# Patient Record
Sex: Female | Born: 1955 | Race: White | Hispanic: No | Smoking: Current every day smoker
Health system: Southern US, Community
[De-identification: ages and names within clinical notes are randomized; demographics above are authoritative.]

## PROBLEM LIST (undated history)

## (undated) DIAGNOSIS — I480 Paroxysmal atrial fibrillation: Secondary | ICD-10-CM

## (undated) DIAGNOSIS — C4490 Unspecified malignant neoplasm of skin, unspecified: Secondary | ICD-10-CM

## (undated) DIAGNOSIS — I341 Nonrheumatic mitral (valve) prolapse: Secondary | ICD-10-CM

## (undated) DIAGNOSIS — K219 Gastro-esophageal reflux disease without esophagitis: Secondary | ICD-10-CM

## (undated) DIAGNOSIS — I10 Essential (primary) hypertension: Secondary | ICD-10-CM

## (undated) DIAGNOSIS — M1990 Unspecified osteoarthritis, unspecified site: Secondary | ICD-10-CM

## (undated) DIAGNOSIS — D509 Iron deficiency anemia, unspecified: Secondary | ICD-10-CM

## (undated) DIAGNOSIS — E7800 Pure hypercholesterolemia, unspecified: Secondary | ICD-10-CM

## (undated) DIAGNOSIS — E0590 Thyrotoxicosis, unspecified without thyrotoxic crisis or storm: Secondary | ICD-10-CM

## (undated) DIAGNOSIS — G4730 Sleep apnea, unspecified: Secondary | ICD-10-CM

## (undated) DIAGNOSIS — Z8489 Family history of other specified conditions: Secondary | ICD-10-CM

## (undated) DIAGNOSIS — N6019 Diffuse cystic mastopathy of unspecified breast: Secondary | ICD-10-CM

## (undated) HISTORY — DX: Gastro-esophageal reflux disease without esophagitis: K21.9

## (undated) HISTORY — DX: Essential (primary) hypertension: I10

## (undated) HISTORY — DX: Paroxysmal atrial fibrillation: I48.0

## (undated) HISTORY — PX: EYE SURGERY: SHX253

## (undated) HISTORY — DX: Diffuse cystic mastopathy of unspecified breast: N60.19

## (undated) HISTORY — PX: TUBAL LIGATION: SHX77

## (undated) HISTORY — DX: Pure hypercholesterolemia, unspecified: E78.00

## (undated) HISTORY — PX: MENISECTOMY: SHX5181

## (undated) HISTORY — DX: Iron deficiency anemia, unspecified: D50.9

## (undated) HISTORY — DX: Thyrotoxicosis, unspecified without thyrotoxic crisis or storm: E05.90

## (undated) HISTORY — DX: Unspecified malignant neoplasm of skin, unspecified: C44.90

## (undated) HISTORY — DX: Nonrheumatic mitral (valve) prolapse: I34.1

---

## 1988-03-15 HISTORY — PX: NASAL SINUS SURGERY: SHX719

## 1988-03-15 HISTORY — PX: NASAL POLYP SURGERY: SHX186

## 1997-09-02 ENCOUNTER — Other Ambulatory Visit: Admission: RE | Admit: 1997-09-02 | Discharge: 1997-09-02 | Payer: Self-pay | Admitting: Family Medicine

## 2000-02-02 ENCOUNTER — Encounter: Payer: Self-pay | Admitting: Family Medicine

## 2000-02-02 ENCOUNTER — Encounter: Admission: RE | Admit: 2000-02-02 | Discharge: 2000-02-02 | Payer: Self-pay | Admitting: Family Medicine

## 2003-02-04 ENCOUNTER — Emergency Department (HOSPITAL_COMMUNITY): Admission: AD | Admit: 2003-02-04 | Discharge: 2003-02-04 | Payer: Self-pay | Admitting: Family Medicine

## 2003-07-18 ENCOUNTER — Encounter (INDEPENDENT_AMBULATORY_CARE_PROVIDER_SITE_OTHER): Payer: Self-pay | Admitting: *Deleted

## 2003-07-18 ENCOUNTER — Ambulatory Visit (HOSPITAL_COMMUNITY): Admission: RE | Admit: 2003-07-18 | Discharge: 2003-07-18 | Payer: Self-pay | Admitting: Gastroenterology

## 2003-07-21 ENCOUNTER — Emergency Department (HOSPITAL_COMMUNITY): Admission: EM | Admit: 2003-07-21 | Discharge: 2003-07-21 | Payer: Self-pay | Admitting: Family Medicine

## 2003-09-03 ENCOUNTER — Encounter: Admission: RE | Admit: 2003-09-03 | Discharge: 2003-09-03 | Payer: Self-pay | Admitting: Family Medicine

## 2003-09-03 ENCOUNTER — Other Ambulatory Visit: Admission: RE | Admit: 2003-09-03 | Discharge: 2003-09-03 | Payer: Self-pay | Admitting: Family Medicine

## 2004-03-11 ENCOUNTER — Emergency Department (HOSPITAL_COMMUNITY): Admission: EM | Admit: 2004-03-11 | Discharge: 2004-03-11 | Payer: Self-pay | Admitting: Emergency Medicine

## 2004-09-02 ENCOUNTER — Other Ambulatory Visit: Admission: RE | Admit: 2004-09-02 | Discharge: 2004-09-02 | Payer: Self-pay | Admitting: Family Medicine

## 2004-11-09 ENCOUNTER — Encounter: Payer: Self-pay | Admitting: Emergency Medicine

## 2004-11-10 ENCOUNTER — Observation Stay (HOSPITAL_COMMUNITY): Admission: EM | Admit: 2004-11-10 | Discharge: 2004-11-11 | Payer: Self-pay | Admitting: Internal Medicine

## 2005-04-19 ENCOUNTER — Encounter: Admission: RE | Admit: 2005-04-19 | Discharge: 2005-04-19 | Payer: Self-pay | Admitting: Family Medicine

## 2005-07-30 ENCOUNTER — Ambulatory Visit (HOSPITAL_COMMUNITY): Admission: RE | Admit: 2005-07-30 | Discharge: 2005-07-30 | Payer: Self-pay | Admitting: Chiropractic Medicine

## 2006-05-10 ENCOUNTER — Encounter: Admission: RE | Admit: 2006-05-10 | Discharge: 2006-05-10 | Payer: Self-pay | Admitting: Physician Assistant

## 2006-09-27 ENCOUNTER — Other Ambulatory Visit: Admission: RE | Admit: 2006-09-27 | Discharge: 2006-09-27 | Payer: Self-pay | Admitting: Family Medicine

## 2006-12-20 ENCOUNTER — Encounter (HOSPITAL_COMMUNITY): Admission: RE | Admit: 2006-12-20 | Discharge: 2007-02-16 | Payer: Self-pay | Admitting: Family Medicine

## 2007-06-04 ENCOUNTER — Emergency Department (HOSPITAL_COMMUNITY): Admission: EM | Admit: 2007-06-04 | Discharge: 2007-06-04 | Payer: Self-pay | Admitting: Emergency Medicine

## 2007-09-04 ENCOUNTER — Encounter: Admission: RE | Admit: 2007-09-04 | Discharge: 2007-09-04 | Payer: Self-pay | Admitting: Family Medicine

## 2008-10-23 ENCOUNTER — Emergency Department (HOSPITAL_COMMUNITY): Admission: EM | Admit: 2008-10-23 | Discharge: 2008-10-23 | Payer: Self-pay | Admitting: Emergency Medicine

## 2009-07-20 ENCOUNTER — Emergency Department (HOSPITAL_COMMUNITY): Admission: EM | Admit: 2009-07-20 | Discharge: 2009-07-20 | Payer: Self-pay | Admitting: Emergency Medicine

## 2009-08-25 IMAGING — CR DG CHEST 1V PORT
1 series · 1 of 1 positions shown · non-contrast
Comparison: Chest x-ray of 06/04/2007

CLINICAL DATA: Heart palpitations, history of atrial fibrillation

PORTABLE CHEST - 1 VIEW

[AP]
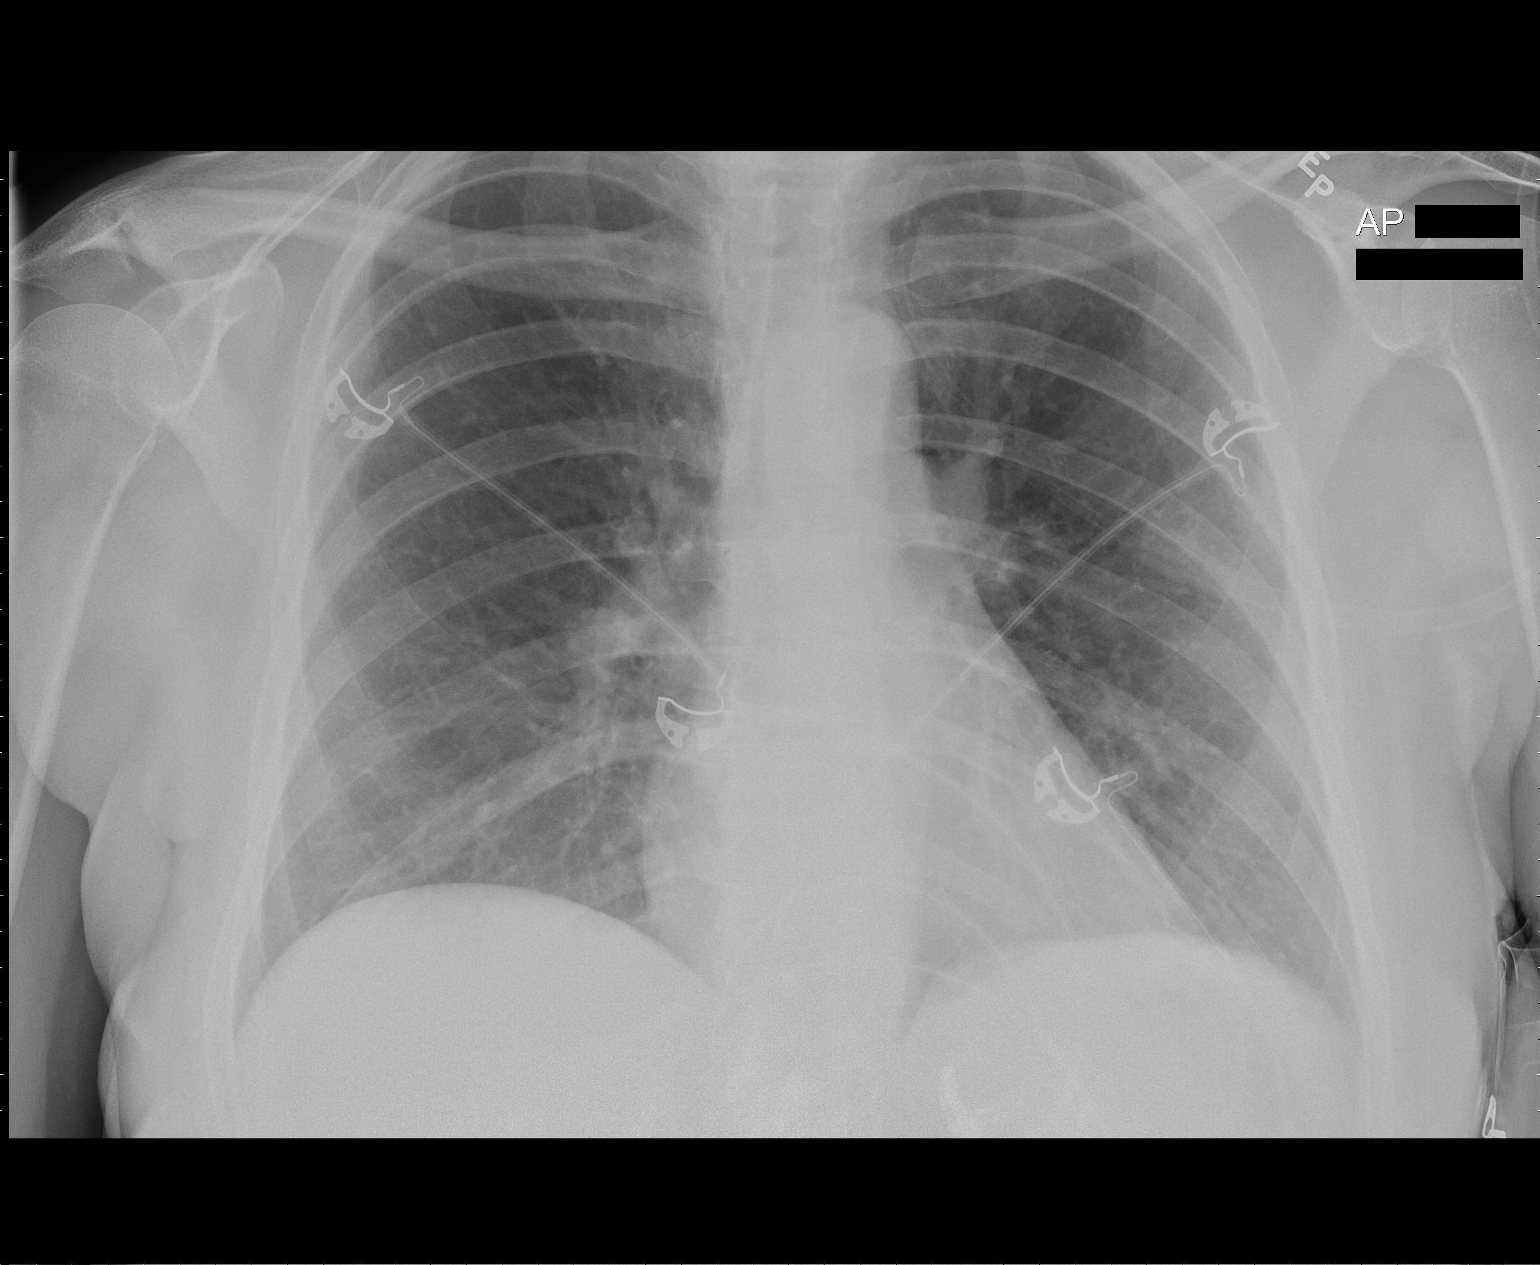

[1 of 1 positions shown; findings below may reference images not displayed]

FINDINGS: The lungs are clear.  The heart is within normal limits
in size.  No bony abnormality is seen.
IMPRESSION: No active lung disease.

## 2010-04-05 ENCOUNTER — Encounter: Payer: Self-pay | Admitting: Family Medicine

## 2010-06-02 LAB — BASIC METABOLIC PANEL
BUN: 8 mg/dL (ref 6–23)
Calcium: 9.4 mg/dL (ref 8.4–10.5)
Chloride: 104 mEq/L (ref 96–112)
Creatinine, Ser: 0.71 mg/dL (ref 0.4–1.2)
GFR calc Af Amer: >60 mL/min (ref >60)
GFR calc non Af Amer: >60 mL/min (ref >60)

## 2010-06-02 LAB — CBC
MCV: 92.6 fL (ref 78.0–100.0)
Platelets: 166 10*3/uL (ref 150–400)
RBC: 4.86 MIL/uL (ref 3.87–5.11)
WBC: 9.1 10*3/uL (ref 4.0–10.5)

## 2010-06-20 LAB — DIFFERENTIAL
Basophils Absolute: 0 10*3/uL (ref 0.0–0.1)
Basophils Relative: 1 % (ref 0–1)
Eosinophils Relative: 3 % (ref 0–5)
Lymphs Abs: 2.6 10*3/uL (ref 0.7–4.0)
Monocytes Relative: 7 % (ref 3–12)
Neutro Abs: 3.3 10*3/uL (ref 1.7–7.7)

## 2010-06-20 LAB — URINALYSIS, ROUTINE W REFLEX MICROSCOPIC
Hgb urine dipstick: NEGATIVE
Ketones, ur: NEGATIVE mg/dL
Protein, ur: NEGATIVE mg/dL
Urobilinogen, UA: 0.2 mg/dL (ref 0.0–1.0)
pH: 6 (ref 5.0–8.0)

## 2010-06-20 LAB — CBC
MCV: 93.1 fL (ref 78.0–100.0)
Platelets: 187 10*3/uL (ref 150–400)
WBC: 6.7 10*3/uL (ref 4.0–10.5)

## 2010-06-20 LAB — BASIC METABOLIC PANEL
BUN: 5 mg/dL — ABNORMAL LOW (ref 6–23)
CO2: 22 mEq/L (ref 19–32)
Chloride: 106 mEq/L (ref 96–112)
GFR calc Af Amer: >60 mL/min (ref >60)
GFR calc non Af Amer: >60 mL/min (ref >60)
Glucose, Bld: 162 mg/dL — ABNORMAL HIGH (ref 70–99)
Potassium: 3.3 mEq/L — ABNORMAL LOW (ref 3.5–5.1)
Sodium: 139 mEq/L (ref 135–145)

## 2010-06-20 LAB — POCT CARDIAC MARKERS
CKMB, poc: 1.7 ng/mL (ref 1.0–8.0)
Myoglobin, poc: 62.1 ng/mL (ref 12–200)

## 2010-06-20 LAB — GLUCOSE, CAPILLARY: Glucose-Capillary: 133 mg/dL — ABNORMAL HIGH (ref 70–99)

## 2010-07-31 NOTE — H&P (Signed)
NAMESHEELA, Kellie Reynolds NO.:  [id number]   MEDICAL RECORD NO.:  [id number]          PATIENT TYPE:  EMS   LOCATION:  ED                           FACILITY:  [facility]   PHYSICIAN:  Deirdre Peer. Polite, M.D. DATE OF BIRTH:  10-Aug-1955   DATE OF ADMISSION:  11/09/2004  DATE OF DISCHARGE:                                HISTORY & PHYSICAL   CHIEF COMPLAINT:  Nausea, vomiting, fever, chills.   HISTORY OF PRESENT ILLNESS:  Kellie Reynolds is a 55 year old female with known  history of COPD, hypertension, high cholesterol, and mitral valve prolapse  who presents to the ED for evaluation of the above chief complaint. The  patient states that she has been having trouble with above chief complaint  off and on since early July. The patient stated that at the time she saw her  primary M.D., Dr. Clovis Riley, who treated her with a Z-Pak and a decongestant  for presumed upper respiratory infection symptoms. The patient had  complained of chills, nausea, and headache. At the end of July the patient  was treated again with Augmentin, according to the patient four pills a day  for 10 days, again for the same complaints. The patient stated that she did  feel somewhat better. However, symptoms appear to have resumed. The patient  states that she chronically has a cough which is productive of yellow-green  sputum, admits to having some fever as high as 102 today. Also, nausea and  vomiting. Denies any abdominal pain. The patient presented to the ED for  evaluation as these symptoms have been persistent on and off since early  July. In the ED the patient was evaluated, had chest x-ray, no apparent  disease. CBC without leukocytosis. However, UA significantly abnormal  showing LE and nitrite positive, white blood cells too numerous to count.  Urine red blood cells 3-6 and many bacteria. [facility] were called  for further evaluation and admission. At the time of my evaluation, the  patient is  alert and oriented x3, still with a low-grade temperature of  99.0, with the symptoms as stated above. Admission is deemed necessary for  further evaluation and treatment for possible urosepsis, and as the patient  has a history of mitral valve prolapse rule out possible endocarditis.   PAST MEDICAL HISTORY:  As stated above, significant for:  1.  Hypertension.  2.  High cholesterol.  3.  Mitral valve prolapse.   MEDICATIONS ON ADMISSION:  1.  Lopressor 50 mg daily.  2.  Lipitor unknown dose.  3.  Effexor 75 mg.   SOCIAL HISTORY:  Positive for tobacco, a pack per day. Positive for alcohol,  approximately four beers a day. Negative for drugs.   PAST SURGICAL HISTORY:  Significant for tubal ligation in the past.   FAMILY HISTORY:  Mother with diabetes, coronary artery disease. Father with  coronary artery disease. Brothers and sisters healthy.   REVIEW OF SYSTEMS:  As stated in the HPI. Occasional headache. Occasional  nausea and vomiting over the last couple of days. No chest pain. Positive  occasional shortness of breath with  productive cough of yellow-green sputum.  No abdominal pain. The patient states that urine has changed in coloration,  being darker, but she denies any dysuria or frequency. Denies any vaginal  discharge; however, does admit to having a recent yeast infection after  prolonged antibiotic of Augmentin.   PHYSICAL EXAMINATION:  GENERAL:  The patient is alert and oriented x3.  VITAL SIGNS:  Temperature 99.0, blood pressure 92/50, pulse 64, respiratory  rate of 16, saturating 97%.  HEENT:  Pupils equal, round, and reactive to light. Anicteric sclerae. No  oral lesions. No nodes, no JVD.  CHEST:  Moderate air movement without rales or rhonchi.  CARDIOVASCULAR:  Regular S1, S2.  ABDOMEN:  Soft, nontender. No hepatosplenomegaly.  EXTREMITIES:  No clubbing, cyanosis, or edema.  SKIN:  No rash.   DATA:  Chest x-ray:  No apparent disease. BMET significant for  potassium of  3.2, sodium 135, BUN of 4, creatinine 0.8, calcium 8.0. CBC:  White count  6.3, hemoglobin 13.1, hematocrit 38.2, MCV 89.8, platelets 161, neutrophil  count 70. UA:  Specific gravity 1.018, urine glucose 100, urine nitrite  positive, leukocyte esterase large, urine wbc's too numerous to count, urine  rbc's 3-6 per high-powered field, bacteria many.   ASSESSMENT:  1.  Urinary tract infection, rule out urosepsis.  2.  Fever. Differential will include secondary to #1 plus or minus subacute      endocarditis. However, please note the patient has not had any recent      invasive procedures or dental procedures in relation to her mitral valve      prolapse.  3.  Chronic obstructive pulmonary disease with moderate chronic sputum      production which has seen no change in frequency or volume per the      patient's report.  4.  Hypertension.  5.  Postmenopausal.  6.  High cholesterol.  7.  History of mitral valve prolapse.   Recommend the patient be admitted to a medicine floor bed. The patient will  be given judicious IV fluids, started empirically on antibiotics, and  pancultured. Right now, the patient definitely has chronic bronchitis which  appears to have been treated appropriately on an outpatient basis, but still  may be a causative factor of her fever. However, her urine seems to be the  obvious source at this time and will  focus antibiotics at that source. As  stated, the patient will have blood cultures as well to rule out subacute  process as stated above. Will make further recommendations after reviewing  the above studies.      Deirdre Peer. Polite, M.D.  Electronically Signed     RDP/MEDQ  D:  11/09/2004  T:  11/09/2004  Job:  [id number]

## 2010-07-31 NOTE — Discharge Summary (Signed)
Kellie Reynolds, Kellie Reynolds NO.:  [id number]   MEDICAL RECORD NO.:  [id number]          PATIENT TYPE:  EMS   LOCATION:  ED                           FACILITY:  [facility]   PHYSICIAN:  Melissa L. Ladona Ridgel, MD  DATE OF BIRTH:  28-Aug-1955   DATE OF ADMISSION:  11/09/2004  DATE OF DISCHARGE:  11/11/2004                                 DISCHARGE SUMMARY   DISCHARGE DIAGNOSES:  1.  Nausea and vomiting secondary to E. coli urinary tract infection. The      patient has been able to tolerate oral medications as well as oral diet.      She has had no nausea and vomiting as of the day of discharge.  2.  Urinary tract infection.  The patient has an E. coli UTI.  The      sensitivities are yet to be available despite calling the microbiology      lab multiple times. The patient states that she feels well. She has a      little bit of burning on urination but has had no further symptoms of      weakness with nausea or vomiting. I will therefore will discharge the      patient home on a quinolone, namely Levaquin, and call the patient in      the morning after receiving the sensitivities. I am concerned that the      E. coli may be resistant to quinolones as most E. coli coming through      the hospital have been resistant.  However, since the patient has been      afebrile and is not nauseated or vomiting and feels well, I feel that      she can discharge to home on the quinolone with follow up from me      regarding the appropriate antibiotic selection. If the E. coli returns      showing resistant to quinolone, I will call her in appropriate      antibiotic therapy at bedtime.  3.  Rapid heart rate. The patient reports a history of chronic palpitations      related to her mitral valve prolapse for which she takes Lopressor 50 mg      daily.  We have not made any changes at this.  4.  Hypercholesterolemia. She is to resume her Lipitor. She did not know      what her home dosing was.   Therefore, she should resume her Lipitor at      the home dose.  5.  Depression. The patient will remain on Effexor 75 mg q.d.  6.  Tobacco abuse. The patient has been offered actually ordered for tobacco      cessation although I do not see that a consult was able to be completed      in the time that she was in the hospital.  I have encouraged her to      attempt to quit smoking. She can pursue further counseling with her      primary care physician.  7.  Mild bronchitis. The  patient will be crossed covered for her UTI with      the Levaquin.  At this time, I do not feel there is any further      intervention for her sputum production other than tobacco cessation.   DISCHARGE MEDICATIONS:  1.  Lopressor 50 mg q.d.  2.  Lipitor.  She is to resume her home dose.  That dose is unknown at this      time.  3.  Effexor 75 mg q.d.  4.  Levaquin 750 mg q.d. x 10 days.   HISTORY OF PRESENT ILLNESS:  The patient is a 55 year old white female with  known history of COPD, hypertension, hypercholesteremia and mitral valve  prolapse. The patient presented to the emergency room after having several  episodes of nausea and vomiting. The patient states that she had been seen  earlier in the month by her primary care physician, actually earlier in July  by her primary care physician for symptoms of cough, congestion.  The  feeling was that she had an upper respiratory infection and therefore she  was started on a Z-Pak.  She also relates that she was given Augmentin when  the symptoms did not improve.  She completed nearly all of the Augmentin  when she continued to have nausea, vomiting and on and off fever. She came  to the emergency room for further evaluation.  In the emergency room, she  was found to have significant pyuria and was placed in the hospital for  further evaluation and IV antibiotics. The patient responded favorably to IV  hydration, medications for nausea and IV Cipro. On the day  of discharge, the  patient appears stable, feeling much better, able to eat and keep her food  down. Unfortunately, as stated, we are unable to get the sensitivities for  her E. coli UTI at this time. I will therefore follow up with the patient  and assure that she is on appropriate antibiotics.   PHYSICAL EXAMINATION:  VITAL SIGNS:  On the day of discharge, the patient's  temperature is 98.6. She did have a T-max of 100.3 at 12 noon on August29th  but no fever since.  Her blood pressure is 109/64, pulse is 61, respirations  18, saturations 97%.  GENERAL:  This is a well-developed, well-nourished white female in no acute  distress.  HEENT:  Pupils equally round and reactive to light. Extraocular muscles  intact. Mucous membranes are moist.  NECK:  Supple with no JVD, no lymph nodes, no carotid bruits.  LUNGS:  She does have mild crackles at the bases which clear with cough. She  had no rhonchi, rales or wheezes.  CARDIOVASCULAR:  Regular rate and rhythm, positive S1, S2.  No S3, S4.  ABDOMEN:  Soft, nontender, nondistended.  EXTREMITIES:  No clubbing, cyanosis or edema.  NEUROLOGIC:  She is awake, alert, oriented. Cranial nerves II-XII are  intact.  Strength is 5/5.   LABORATORY DATA:  Pertinent laboratory values during the course of hospital  stay reveal on the day of discharge a white count of 5.0, hemoglobin of 11.9  hematocrit 34.5 and platelets of 150. Her respiratory growth culture grew  normal flora.  Her BMET shows a sodium of 142, potassium 3.7, chloride of  107, CO2 of 28, BUN 2, creatinine of 0.7, glucose of 118 and calcium of 8.9.  The urine culture as stated is greater than 100,000 colonies of E. coli.  Sensitivities are pending.  Blood cultures have been negative.  At this time the patient is deemed stable for discharge to home on oral  antibiotics with follow-up with sensitivities by Dr. Ladona Ridgel, who will call her if there is any change to the antibiotic.  I will also  call her  regardless of the appropriate antibiotic therapy just to assure her and to  continue her treatment.   Addendum 11/23/04 MLT:  The patient was called the following day with the  report that her E. coli was pansensitive.  She was instructed to continue  her antibiotics as ordered.      Melissa L. Ladona Ridgel, MD  Electronically Signed     MLT/MEDQ  D:  11/11/2004  T:  11/11/2004  Job:  [id number]

## 2010-07-31 NOTE — Op Note (Signed)
NAME:  Kellie Reynolds, Kellie Reynolds                        ACCOUNT NO.:  [id number]   MEDICAL RECORD NO.:  [id number]                   PATIENT TYPE:  AMB   LOCATION:  ENDO                                 FACILITY:  [facility]   PHYSICIAN:  Danise Edge, M.D.                DATE OF BIRTH:  May 22, 1955   DATE OF PROCEDURE:  07/18/2003  DATE OF DISCHARGE:                                 OPERATIVE REPORT   PROCEDURE:  Esophagogastroduodenoscopy, colonoscopy, polypectomy.   REFERRING PHYSICIAN:  L. Lupe Carney, M.D.   INDICATIONS:  [name] is a 55 year old female, born Jul 17, 1955.  Mrs. Sellen is undergoing upper and lower GI endoscopy to evaluate blood  in her stool.   ENDOSCOPIST:  Danise Edge, M.D.   PREMEDICATION:  Versed 10 mg, Demerol 70 mg.   DESCRIPTION OF PROCEDURE:  Diagnostic esophagogastroduodenoscopy:  After  obtaining informed consent, Mrs. Gaetz was placed on the left lateral  decubitus position.  I administered intravenous Demerol and intravenous  Versed to achieve conscious sedation for the procedure.  The patient's blood  pressure, oxygen saturation and cardiac rhythm were monitored throughout the  procedure and documented in the medical record.   The Olympus gastroscope was passed through the posterior hypopharynx into  the proximal esophagus without difficulty.  The hypopharynx, larynx and  vocal cords appeared normal.   Esophagoscopy:  The proximal, mid and lower segments of the esophageal  mucosa appear normal.   Gastroscopy:  Mrs. Fedor has a small hiatal hernia.  Retroflexed view of  the gastric cardia and fundus was normal.  The gastric body, antrum and  pylorus appeared normal.   Duodenoscopy:  The duodenal bulb, mid duodenum and distal duodenum appear  normal.   ASSESSMENT:  1. Normal esophagogastroduodenoscopy.  2. Small hiatal hernia.   Proctocolonoscopy with polypectomy:  Anal inspection and digital rectal exam  were normal.  The  Olympus adjustable pediatric colonoscope was introduced  into the rectum and advanced to the cecum.  Colonic preparation for the exam  today was excellent.   Rectum:  From the proximal rectum, a 3 mm sessile polyp was removed with the  electrocautery snare.  Sigmoid colon and descending colon:  At 60 cm from the anal verge, a 2 mm  sessile polyp was removed with the electrocautery snare.  Splenic flexure:  Normal.  Transverse colon:  Normal.  Hepatic flexure:  Normal.  Ascending colon:  Normal.  Cecum and ileocecal valve:  From the distal cecum, a 3 mm sessile polyp was  removed with the electrocautery snare.   ASSESSMENT:  A small polyp was removed from the distal cecum, a small polyp  was removed from the left colon at 60 cm from the anal verge and a small  polyp was removed from the proximal rectum.  Danise Edge, M.D.    MJ/MEDQ  D:  07/18/2003  T:  07/18/2003  Job:  [id number]   cc:   L. Lupe Carney, M.D.  [address] [place]  [place] [address]  Fax: [phone]

## 2010-12-07 LAB — I-STAT 8, (EC8 V) (CONVERTED LAB)
Acid-base deficit: 2
BUN: 6
Bicarbonate: 20.2
Glucose, Bld: 109 — ABNORMAL HIGH
Operator id: 284141
Sodium: 138
TCO2: 21
pH, Ven: 7.473 — ABNORMAL HIGH

## 2010-12-07 LAB — POCT I-STAT CREATININE
Creatinine, Ser: 0.9
Operator id: 284141

## 2010-12-07 LAB — POCT CARDIAC MARKERS: Myoglobin, poc: 53.3

## 2011-05-10 ENCOUNTER — Ambulatory Visit (INDEPENDENT_AMBULATORY_CARE_PROVIDER_SITE_OTHER): Payer: BC Managed Care – PPO | Admitting: Family Medicine

## 2011-05-10 VITALS — BP 134/74 | HR 73 | Temp 98.1°F | Resp 16 | Ht 69.0 in | Wt 178.0 lb

## 2011-05-10 DIAGNOSIS — E7800 Pure hypercholesterolemia, unspecified: Secondary | ICD-10-CM

## 2011-05-10 DIAGNOSIS — J189 Pneumonia, unspecified organism: Secondary | ICD-10-CM

## 2011-05-10 DIAGNOSIS — I10 Essential (primary) hypertension: Secondary | ICD-10-CM

## 2011-05-10 DIAGNOSIS — I4891 Unspecified atrial fibrillation: Secondary | ICD-10-CM | POA: Insufficient documentation

## 2011-05-10 MED ORDER — CEFDINIR 300 MG PO CAPS: 300.0000 mg | ORAL_CAPSULE | Freq: Two times a day (BID) | ORAL | Status: AC

## 2011-05-10 NOTE — Progress Notes (Signed)
  Patient Name: CECILLIA Reynolds Date of Birth: [birth year] Medical Record Number: [id number] Gender: female Date of Encounter: 05/10/2011  History of Present Illness:  Kellie Reynolds is a 56 y.o. very pleasant female patient who presents with the following:  Felt fine all weekend- then yesterday am "my lungs were on fire."  She does smoke but had not smoked much the 24 hours before because she was in airports traveling home.  Then developed a productive cough with discolered mucus, burning sinuses today.  No fever, does have chills.  Had been in [place] last week; 2 hour flight home.  No sore throat or earache, did have diarrhea yesterday but could have been due to diet change.   History of intermittent atrial fibrillation- pt of [facility].  She can tell when she is not in rhythm but currently she IS in sinus rhythmv Patient Active Problem List  Diagnoses  . Atrial fibrillation  . High cholesterol  . Hypertension   Past Medical History  Diagnosis Date  . GERD (gastroesophageal reflux disease)    Past Surgical History  Procedure Date  . Tubal ligation    History  Substance Use Topics  . Smoking status: Current Everyday Smoker  . Smokeless tobacco: Never Used  . Alcohol Use: Yes     12 beers weekly   Family History  Problem Relation Age of Onset  . Diabetes Mother   . Hyperlipidemia Father   . Hypertension Father    No Known Allergies  Medication list has been reviewed and updated.  Review of Systems: As per HPI- otherwise negative.  Physical Examination: Filed Vitals:   05/10/11 1257  BP: 134/74  Pulse: 73  Temp: 98.1 F (36.7 C)  TempSrc: Oral  Resp: 16  Height: 5\' 9"  (1.753 m)  Weight: 178 lb (80.74 kg)    Body mass index is 26.29 kg/(m^2).  GEN: WDWN, NAD, Non-toxic, A & O x 3, overweight, sunburned HEENT: Atraumatic, Normocephalic. Neck supple. No masses, No LAD. TM wnl bilaterally, oropharynx wnl Ears and Nose: No external deformity. Nasal cavity  congested CV: RRR, mildly bradycardic.  Seems to be in sinus bradycardia.  PULM: good air movement bilaterally, there are crackles at the right base suggesting pneumonia. No retractions. No resp. distress. No accessory muscle use. EXTR: No c/c/e NEURO Normal gait.  PSYCH: Normally interactive. Conversant. Not depressed or anxious appearing.  Calm demeanor.   Assessment and Plan: 1. Pneumonia  cefdinir (OMNICEF) 300 MG capsule  2. Atrial fibrillation    3. High cholesterol    4. Hypertension     Treat for pneumonia as above.  If not better within the next couple of days let us know- sooner if worse!

## 2012-02-14 ENCOUNTER — Encounter: Payer: Self-pay | Admitting: Internal Medicine

## 2012-03-01 ENCOUNTER — Ambulatory Visit: Payer: Self-pay | Admitting: Cardiovascular Disease

## 2012-03-03 ENCOUNTER — Encounter: Payer: Self-pay | Admitting: *Deleted

## 2012-03-10 ENCOUNTER — Encounter: Payer: Self-pay | Admitting: *Deleted

## 2012-03-10 ENCOUNTER — Ambulatory Visit (INDEPENDENT_AMBULATORY_CARE_PROVIDER_SITE_OTHER): Payer: BC Managed Care – PPO | Admitting: Internal Medicine

## 2012-03-10 VITALS — BP 148/76 | HR 54 | Ht 68.0 in | Wt 189.4 lb

## 2012-03-10 DIAGNOSIS — I4892 Unspecified atrial flutter: Secondary | ICD-10-CM

## 2012-03-10 DIAGNOSIS — I10 Essential (primary) hypertension: Secondary | ICD-10-CM

## 2012-03-10 DIAGNOSIS — I4891 Unspecified atrial fibrillation: Secondary | ICD-10-CM

## 2012-03-10 MED ORDER — RIVAROXABAN 20 MG PO TABS: 20.0000 mg | ORAL_TABLET | Freq: Every day | ORAL | Status: DC

## 2012-03-10 NOTE — Patient Instructions (Addendum)
Your physician has recommended that you have an ablation. Catheter ablation is a medical procedure used to treat some cardiac arrhythmias (irregular heartbeats). During catheter ablation, a long, thin, flexible tube is put into a blood vessel in your groin (upper thigh), or neck. This tube is called an ablation catheter. It is then guided to your heart through the blood vessel. Radio frequency waves destroy small areas of heart tissue where abnormal heartbeats may cause an arrhythmia to start. Please see the instruction sheet given to you today.    Your physician has recommended you make the following change in your medication:  1) Stop Aspirin 2) Start Xarelto 20mg  daily   Ablation will ne on 04/18/12 with TEE ON 04/17/12--- [facility] Dr Carmelia Roller office will call you to schedule TEE

## 2012-03-17 ENCOUNTER — Other Ambulatory Visit: Payer: Self-pay | Admitting: *Deleted

## 2012-03-17 DIAGNOSIS — I4891 Unspecified atrial fibrillation: Secondary | ICD-10-CM

## 2012-03-19 ENCOUNTER — Encounter: Payer: Self-pay | Admitting: Internal Medicine

## 2012-03-19 DIAGNOSIS — I4892 Unspecified atrial flutter: Secondary | ICD-10-CM | POA: Insufficient documentation

## 2012-03-19 NOTE — Assessment & Plan Note (Signed)
Stable No change required today  

## 2012-03-19 NOTE — Assessment & Plan Note (Signed)
The patient has paroxysmal atrial fibrillation.  Her recent event monitor has also documented atrial flutter. Therapeutic strategies for afib and atrial flutter including medicine and ablation were discussed in detail with the patient today. Risk, benefits, and alternatives to EP study and radiofrequency ablation for afib were also discussed in detail today. These risks include but are not limited to stroke, bleeding, vascular damage, tamponade, perforation, damage to the esophagus, lungs, and other structures, pulmonary vein stenosis, worsening renal function, and death. The patient understands these risk and wishes to proceed.   We will stop ASA and start Xarelto 20mg  daily today.   We will therefore proceed with catheter ablation once the patient has been adequately anticoagulated.

## 2012-03-19 NOTE — Progress Notes (Signed)
 Primary Care Physician: Dr Dean Mitchell  Referring Physician:  Dr Varanasi   Kellie Reynolds is a 57 y.o. female with a h/o paroxysmal atrial fibrillation who presents today for EP consultation.  The patient reports that she has had afib for at least 5 or 6 years.  Episodes have recently increased in frequency and duration.  She reports tachypalpitations with associated SOB, fatigue, and decreased exercise tolerance.  She has been evaluated by Dr Varanasi and treated with diltiazem and flecainide.  She reports ongoing afib 3-4 times per week despite medical therapy with flecainide.  She reports that she does not well tolerate medicines and is afraid to try other AAD.  She has been wearing an event monitor recently which has documented both atrial fibrillation as well as atrial flutter. Today, she denies symptoms of chest pain, shortness of breath, orthopnea, PND, lower extremity edema, dizziness, presyncope, syncope, or neurologic sequela. The patient is tolerating medications without difficulties and is otherwise without complaint today.   Past Medical History  Diagnosis Date  . GERD (gastroesophageal reflux disease)   . HTN (hypertension)   . Hypercholesteremia   . MVP (mitral valve prolapse)   . Paroxysmal atrial fibrillation   . Skin cancer   . Fibrocystic breast disease    Past Surgical History  Procedure Date  . Tubal ligation   . Menisectomy     Right knee  . Nasal sinus surgery 1990  . Nasal polyp surgery 1990    Current Outpatient Prescriptions  Medication Sig Dispense Refill  . aspirin 325 MG EC tablet Take 325 mg by mouth daily.      . atorvastatin (LIPITOR) 10 MG tablet Take 10 mg by mouth daily.      . cyanocobalamin 1000 MCG tablet Take 100 mcg by mouth daily.      . fish oil-omega-3 fatty acids 1000 MG capsule Take 1,200 mg by mouth 2 (two) times daily.      . flecainide (TAMBOCOR) 100 MG tablet Take 100 mg by mouth 2 (two) times daily.      . lisinopril  (PRINIVIL,ZESTRIL) 10 MG tablet Take 10 mg by mouth daily.      . metoprolol succinate (TOPROL-XL) 50 MG 24 hr tablet Take 50 mg by mouth 2 (two) times daily. Take with or immediately following a meal.      . omeprazole (PRILOSEC) 20 MG capsule Take 20 mg by mouth daily.      . Rivaroxaban (XARELTO) 20 MG TABS Take 1 tablet (20 mg total) by mouth daily.  30 tablet  6    No Known Allergies  History   Social History  . Marital Status: Divorced    Spouse Name: N/A    Number of Children: N/A  . Years of Education: N/A   Occupational History  . Not on file.   Social History Main Topics  . Smoking status: Current Every Day Smoker -- 1.0 packs/day  . Smokeless tobacco: Never Used  . Alcohol Use: Yes     Comment: 12 beers weekly  . Drug Use: No  . Sexually Active: Not on file   Other Topics Concern  . Not on file   Social History Narrative  . No narrative on file    Family History  Problem Relation Age of Onset  . Diabetes Mother   . Hyperlipidemia Father   . Hypertension Father   . CAD Father   . Liver cancer Maternal Grandmother     ROS- All systems   are reviewed and negative except as per the HPI above  Physical Exam: Filed Vitals:   03/10/12 1606  BP: 148/76  Pulse: 54  Height: 5' 8" (1.727 m)  Weight: 189 lb 6.4 oz (85.911 kg)    GEN- The patient is well appearing, alert and oriented x 3 today.   Head- normocephalic, atraumatic Eyes-  Sclera clear, conjunctiva pink Ears- hearing intact Oropharynx- clear Neck- supple, no JVP Lymph- no cervical lymphadenopathy Lungs- Clear to ausculation bilaterally, normal work of breathing Heart- Regular rate and rhythm, no murmurs, rubs or gallops, PMI not laterally displaced GI- soft, NT, ND, + BS Extremities- no clubbing, cyanosis, or edema MS- no significant deformity or atrophy Skin- no rash or lesion Psych- euthymic mood, full affect Neuro- strength and sensation are intact  EKG today reveals sinus rhythm 56  bpm, PR 208msec, otherwise normal ekg Echo 04/06/04- ef 70%, mild MR, LA 37mm  Assessment and Plan:  

## 2012-03-19 NOTE — Assessment & Plan Note (Signed)
As above.

## 2012-03-20 ENCOUNTER — Telehealth: Payer: Self-pay | Admitting: Internal Medicine

## 2012-03-20 NOTE — Telephone Encounter (Signed)
New problem:    Returning call from Friday discuss upcoming procedure.

## 2012-03-20 NOTE — Telephone Encounter (Signed)
Afib ablation scheduled for 04/18/12  Dr Sidney Ace office to do the TEE on 2/3  I have left linda a message in regards to this and have asked the patient to call also

## 2012-03-28 ENCOUNTER — Encounter (HOSPITAL_COMMUNITY): Payer: Self-pay | Admitting: Emergency Medicine

## 2012-03-28 ENCOUNTER — Emergency Department (HOSPITAL_COMMUNITY)
Admission: EM | Admit: 2012-03-28 | Discharge: 2012-03-28 | Disposition: A | Discharge status: Home / Self Care | Payer: BC Managed Care – PPO | Attending: Emergency Medicine | Admitting: Emergency Medicine

## 2012-03-28 ENCOUNTER — Emergency Department (HOSPITAL_COMMUNITY): Payer: BC Managed Care – PPO

## 2012-03-28 DIAGNOSIS — R059 Cough, unspecified: Secondary | ICD-10-CM | POA: Insufficient documentation

## 2012-03-28 DIAGNOSIS — R0602 Shortness of breath: Secondary | ICD-10-CM | POA: Insufficient documentation

## 2012-03-28 DIAGNOSIS — K219 Gastro-esophageal reflux disease without esophagitis: Secondary | ICD-10-CM | POA: Insufficient documentation

## 2012-03-28 DIAGNOSIS — R0789 Other chest pain: Secondary | ICD-10-CM | POA: Insufficient documentation

## 2012-03-28 DIAGNOSIS — R5381 Other malaise: Secondary | ICD-10-CM | POA: Insufficient documentation

## 2012-03-28 DIAGNOSIS — Z85828 Personal history of other malignant neoplasm of skin: Secondary | ICD-10-CM | POA: Insufficient documentation

## 2012-03-28 DIAGNOSIS — Z8742 Personal history of other diseases of the female genital tract: Secondary | ICD-10-CM | POA: Insufficient documentation

## 2012-03-28 DIAGNOSIS — Z7901 Long term (current) use of anticoagulants: Secondary | ICD-10-CM | POA: Insufficient documentation

## 2012-03-28 DIAGNOSIS — Z79899 Other long term (current) drug therapy: Secondary | ICD-10-CM | POA: Insufficient documentation

## 2012-03-28 DIAGNOSIS — I10 Essential (primary) hypertension: Secondary | ICD-10-CM | POA: Insufficient documentation

## 2012-03-28 DIAGNOSIS — R002 Palpitations: Secondary | ICD-10-CM | POA: Insufficient documentation

## 2012-03-28 DIAGNOSIS — R05 Cough: Secondary | ICD-10-CM | POA: Insufficient documentation

## 2012-03-28 DIAGNOSIS — E7800 Pure hypercholesterolemia, unspecified: Secondary | ICD-10-CM | POA: Insufficient documentation

## 2012-03-28 DIAGNOSIS — Z8679 Personal history of other diseases of the circulatory system: Secondary | ICD-10-CM | POA: Insufficient documentation

## 2012-03-28 DIAGNOSIS — F17200 Nicotine dependence, unspecified, uncomplicated: Secondary | ICD-10-CM | POA: Insufficient documentation

## 2012-03-28 DIAGNOSIS — I4892 Unspecified atrial flutter: Secondary | ICD-10-CM

## 2012-03-28 LAB — CBC
MCH: 31.5 pg (ref 26.0–34.0)
MCHC: 34.9 g/dL (ref 30.0–36.0)
Platelets: 175 10*3/uL (ref 150–400)
RDW: 12.7 % (ref 11.5–15.5)

## 2012-03-28 LAB — BASIC METABOLIC PANEL
BUN: 9 mg/dL (ref 6–23)
Calcium: 9.5 mg/dL (ref 8.4–10.5)
Chloride: 99 mEq/L (ref 96–112)
Creatinine, Ser: 0.79 mg/dL (ref 0.50–1.10)
GFR calc Af Amer: >90 mL/min (ref >90)
GFR calc non Af Amer: >90 mL/min (ref >90)

## 2012-03-28 LAB — TROPONIN I: Troponin I: <0.3 ng/mL (ref <0.30)

## 2012-03-28 LAB — MAGNESIUM: Magnesium: 1.8 mg/dL (ref 1.5–2.5)

## 2012-03-28 MED ORDER — SODIUM CHLORIDE 0.9 % IV BOLUS (SEPSIS)
1000.0000 mL | Freq: Once | INTRAVENOUS | Status: AC
  Administered 2012-03-28: 1000 mL via INTRAVENOUS

## 2012-03-28 MED ORDER — MORPHINE SULFATE 4 MG/ML IJ SOLN: 4.0000 mg | Freq: Once | INTRAMUSCULAR | Status: DC

## 2012-03-28 MED ORDER — ASPIRIN 81 MG PO CHEW: 324.0000 mg | CHEWABLE_TABLET | Freq: Once | ORAL | Status: DC

## 2012-03-28 MED ORDER — METOPROLOL TARTRATE 1 MG/ML IV SOLN
5.0000 mg | Freq: Once | INTRAVENOUS | Status: AC
  Administered 2012-03-28: 5 mg via INTRAVENOUS
  Filled 2012-03-28: qty 5

## 2012-03-28 NOTE — ED Notes (Signed)
Patient c/o increased shortness of breath and Afib.  Patient is scheduled for an ablation on 04/18/12 with Dr. Johney Frame.  Patient states she was walking into work this morning and felt like she was going to pass out.

## 2012-03-28 NOTE — ED Notes (Signed)
Bed:WA22<BR> Expected date:<BR> Expected time:<BR> Means of arrival:<BR> Comments:<BR>

## 2012-03-28 NOTE — ED Provider Notes (Signed)
History     CSN: [id number]  Arrival date & time 03/28/12  0932   First MD Initiated Contact with Patient 03/28/12 [phone]      Chief Complaint  Patient presents with  . Atrial Fibrillation  . Shortness of Breath    (Consider location/radiation/quality/duration/timing/severity/associated sxs/prior treatment) Patient is a 57 y.o. female presenting with atrial fibrillation and shortness of breath. The history is provided by the patient.  Atrial Fibrillation This is a chronic problem. Associated symptoms include shortness of breath. Pertinent negatives include no chest pain, no abdominal pain and no headaches.  Shortness of Breath  Associated symptoms include cough and shortness of breath. Pertinent negatives include no chest pain.   patient has a history of paroxysmal A. fib. She states that she goes in and out of it. She states that today it came on and she felt worse than normal. She states she shortness of breath. Mild chest tightness. She felt like she was going to pass out. She is on metoprolol and flecainide. She is scheduled for ablation in February. She states she has not been sleeping as much. She's also had a cough. She states she feels better now than it did at its worst. No weight loss.  Past Medical History  Diagnosis Date  . GERD (gastroesophageal reflux disease)   . HTN (hypertension)   . Hypercholesteremia   . MVP (mitral valve prolapse)   . Paroxysmal atrial fibrillation   . Skin cancer   . Fibrocystic breast disease     Past Surgical History  Procedure Date  . Tubal ligation   . Menisectomy     Right knee  . Nasal sinus surgery 1990  . Nasal polyp surgery 1990    Family History  Problem Relation Age of Onset  . Diabetes Mother   . Hyperlipidemia Father   . Hypertension Father   . CAD Father   . Liver cancer Maternal Grandmother     History  Substance Use Topics  . Smoking status: Current Every Day Smoker -- 1.0 packs/day  . Smokeless tobacco: Never  Used  . Alcohol Use: Yes     Comment: 12 beers weekly    OB History    Grav Para Term Preterm Abortions TAB SAB Ect Mult Living                  Review of Systems  Constitutional: Positive for fatigue. Negative for activity change and appetite change.  HENT: Negative for neck stiffness.   Eyes: Negative for pain.  Respiratory: Positive for cough and shortness of breath. Negative for chest tightness.   Cardiovascular: Positive for palpitations. Negative for chest pain and leg swelling.  Gastrointestinal: Negative for nausea, vomiting, abdominal pain and diarrhea.  Genitourinary: Negative for flank pain.  Musculoskeletal: Negative for back pain.  Skin: Negative for rash.  Neurological: Positive for light-headedness. Negative for weakness, numbness and headaches.  Psychiatric/Behavioral: Negative for behavioral problems.    Allergies  Review of patient's allergies indicates no known allergies.  Home Medications   Current Outpatient Rx  Name  Route  Sig  Dispense  Refill  . ATORVASTATIN CALCIUM 10 MG PO TABS   Oral   Take 10 mg by mouth every evening.          Marland Kitchen CYANOCOBALAMIN 1000 MCG PO TABS   Oral   Take 100 mcg by mouth every evening.          Marland Kitchen OMEGA-3 FATTY ACIDS 1000 MG PO CAPS   Oral  Take 2 g by mouth every evening.          Marland Kitchen FLECAINIDE ACETATE 100 MG PO TABS   Oral   Take 100 mg by mouth 2 (two) times daily.         Marland Kitchen LISINOPRIL 10 MG PO TABS   Oral   Take 10 mg by mouth every evening.          Marland Kitchen METOPROLOL SUCCINATE ER 50 MG PO TB24   Oral   Take 100 mg by mouth daily. Take with or immediately following a meal.         . OMEPRAZOLE 20 MG PO CPDR   Oral   Take 20 mg by mouth every morning.          Marland Kitchen RIVAROXABAN 20 MG PO TABS   Oral   Take 1 tablet (20 mg total) by mouth daily.   30 tablet   6     BP 108/49  Pulse 64  Temp 97.8 F (36.6 C) (Oral)  Resp 14  Ht 5\' 8"  (1.727 m)  Wt 185 lb (83.915 kg)  BMI 28.13 kg/m2  SpO2  96%  Physical Exam  Nursing note and vitals reviewed. Constitutional: She is oriented to person, place, and time. She appears well-developed and well-nourished.  HENT:  Head: Normocephalic and atraumatic.  Eyes: EOM are normal. Pupils are equal, round, and reactive to light.  Neck: Normal range of motion. Neck supple.  Cardiovascular: Regular rhythm and normal heart sounds.   No murmur heard.      Irregular rhythm  Pulmonary/Chest: Effort normal and breath sounds normal. No respiratory distress. She has no wheezes. She has no rales.  Abdominal: Soft. Bowel sounds are normal. She exhibits no distension. There is no tenderness. There is no rebound and no guarding.  Musculoskeletal: Normal range of motion.  Neurological: She is alert and oriented to person, place, and time. No cranial nerve deficit.  Skin: Skin is warm and dry.  Psychiatric: She has a normal mood and affect. Her speech is normal.    ED Course  Procedures (including critical care time)  Labs Reviewed  CBC - Abnormal; Notable for the following:    WBC 11.0 (*)     Hemoglobin 15.1 (*)     All other components within normal limits  BASIC METABOLIC PANEL - Abnormal; Notable for the following:    Glucose, Bld 227 (*)     All other components within normal limits  TROPONIN I  MAGNESIUM   Dg Chest 2 View  03/28/2012  *RADIOLOGY REPORT*  Clinical Data: Shortness of breath.  Atrial fibrillation.  CHEST - 2 VIEW  Comparison: 07/20/2009  Findings: Heart and mediastinal contours are within normal limits. No focal opacities or effusions.  No acute bony abnormality. Biapical scarring.  IMPRESSION: No active cardiopulmonary disease.   Original Report Authenticated By: Charlett Nose, M.D.      1. Atrial flutter     Date: 03/28/2012  Rate: 115  Rhythm: atrial flutter  QRS Axis: normal  Intervals: normal  ST/T Wave abnormalities: nonspecific ST/T changes  Conduction Disutrbances:none  Narrative Interpretation: atrial flutter  is new  Old EKG Reviewed: changes noted     MDM  Patient has paroxysmal A. fib and flutter. She came in after having an episode of her arrhythmia. She states she feels somewhat better now. She has frequent episodes of it. She is on metoprolol and put in it. She's converted back to sinus rhythm after 5 mg  of IV metoprolol and a 1 L fluid bolus. After discussion with Dr. Mayford Knife the patient's medications were adjusted. She'll be discharged home follow up with her electrophysiologist        Juliet Rude. Rubin Payor, MD 03/28/12 1156

## 2012-04-07 ENCOUNTER — Encounter (HOSPITAL_COMMUNITY): Payer: Self-pay | Admitting: Pharmacy Technician

## 2012-04-11 ENCOUNTER — Other Ambulatory Visit: Payer: BC Managed Care – PPO

## 2012-04-12 ENCOUNTER — Other Ambulatory Visit (INDEPENDENT_AMBULATORY_CARE_PROVIDER_SITE_OTHER): Payer: BC Managed Care – PPO

## 2012-04-12 ENCOUNTER — Other Ambulatory Visit: Payer: Self-pay | Admitting: Interventional Cardiology

## 2012-04-12 DIAGNOSIS — I4891 Unspecified atrial fibrillation: Secondary | ICD-10-CM

## 2012-04-12 LAB — CBC WITH DIFFERENTIAL/PLATELET
Basophils Relative: 0.4 % (ref 0.0–3.0)
Eosinophils Relative: 3.5 % (ref 0.0–5.0)
Lymphocytes Relative: 26.4 % (ref 12.0–46.0)
MCV: 92.5 fl (ref 78.0–100.0)
Monocytes Absolute: 0.6 10*3/uL (ref 0.1–1.0)
Monocytes Relative: 7.4 % (ref 3.0–12.0)
Neutrophils Relative %: 62.3 % (ref 43.0–77.0)
RBC: 4.51 Mil/uL (ref 3.87–5.11)
WBC: 7.5 10*3/uL (ref 4.5–10.5)

## 2012-04-12 LAB — BASIC METABOLIC PANEL
Chloride: 103 mEq/L (ref 96–112)
Creatinine, Ser: 0.9 mg/dL (ref 0.4–1.2)
GFR: 73.58 mL/min (ref >60.00)

## 2012-04-17 ENCOUNTER — Ambulatory Visit (HOSPITAL_COMMUNITY): Admit: 2012-04-17 | Payer: Self-pay | Admitting: Cardiovascular Disease

## 2012-04-17 ENCOUNTER — Ambulatory Visit (HOSPITAL_COMMUNITY)
Admission: RE | Admit: 2012-04-17 | Discharge: 2012-04-17 | Disposition: A | Discharge status: Home / Self Care | Payer: BC Managed Care – PPO | Source: Ambulatory Visit | Attending: Interventional Cardiology | Admitting: Interventional Cardiology

## 2012-04-17 ENCOUNTER — Encounter (HOSPITAL_COMMUNITY)
Admission: RE | Disposition: A | Discharge status: Home / Self Care | Payer: Self-pay | Source: Ambulatory Visit | Attending: Interventional Cardiology

## 2012-04-17 ENCOUNTER — Encounter (HOSPITAL_COMMUNITY): Payer: Self-pay | Admitting: *Deleted

## 2012-04-17 ENCOUNTER — Encounter (HOSPITAL_COMMUNITY): Payer: Self-pay

## 2012-04-17 DIAGNOSIS — I059 Rheumatic mitral valve disease, unspecified: Secondary | ICD-10-CM | POA: Insufficient documentation

## 2012-04-17 DIAGNOSIS — I079 Rheumatic tricuspid valve disease, unspecified: Secondary | ICD-10-CM | POA: Insufficient documentation

## 2012-04-17 DIAGNOSIS — I4891 Unspecified atrial fibrillation: Secondary | ICD-10-CM

## 2012-04-17 HISTORY — PX: TEE WITHOUT CARDIOVERSION: SHX5443

## 2012-04-17 LAB — GLUCOSE, CAPILLARY: Glucose-Capillary: 98 mg/dL (ref 70–99)

## 2012-04-17 SURGERY — ECHOCARDIOGRAM, TRANSESOPHAGEAL
Anesthesia: Moderate Sedation

## 2012-04-17 MED ORDER — FENTANYL CITRATE 0.05 MG/ML IJ SOLN
INTRAMUSCULAR | Status: AC
  Filled 2012-04-17: qty 2

## 2012-04-17 MED ORDER — MIDAZOLAM HCL 10 MG/2ML IJ SOLN
INTRAMUSCULAR | Status: DC | PRN
  Administered 2012-04-17: 2 mg via INTRAVENOUS
  Administered 2012-04-17 (×3): 1 mg via INTRAVENOUS

## 2012-04-17 MED ORDER — FENTANYL CITRATE 0.05 MG/ML IJ SOLN
INTRAMUSCULAR | Status: DC | PRN
  Administered 2012-04-17: 50 ug via INTRAVENOUS
  Administered 2012-04-17: 25 ug via INTRAVENOUS

## 2012-04-17 MED ORDER — SODIUM CHLORIDE 0.9 % IV SOLN
INTRAVENOUS | Status: DC
  Administered 2012-04-17: 500 mL via INTRAVENOUS

## 2012-04-17 MED ORDER — LIDOCAINE VISCOUS 2 % MT SOLN
OROMUCOSAL | Status: DC | PRN
  Administered 2012-04-17: 10 mL via OROMUCOSAL

## 2012-04-17 MED ORDER — LIDOCAINE VISCOUS 2 % MT SOLN
OROMUCOSAL | Status: AC
  Filled 2012-04-17: qty 15

## 2012-04-17 MED ORDER — MIDAZOLAM HCL 5 MG/ML IJ SOLN
INTRAMUSCULAR | Status: AC
  Filled 2012-04-17: qty 2

## 2012-04-17 NOTE — H&P (Signed)
  Date of Initial H&P: 04/04/12  History reviewed, patient examined, no change in status, stable for surgery.

## 2012-04-17 NOTE — CV Procedure (Addendum)
Normal LV function.  Mild MR.  No LA/LAA thrombus.  No interatrial communication by Doppler.  Mild TR.  Moderate atherosclerosis in the descending aorta.  Normal aortic valve.  No aortic stenosis.  No aortic regurgitation.  No contraindication to ablation tomorrow.

## 2012-04-18 ENCOUNTER — Observation Stay (HOSPITAL_COMMUNITY)
Admission: RE | Admit: 2012-04-18 | Discharge: 2012-04-19 | Disposition: A | Discharge status: Home / Self Care | Payer: BC Managed Care – PPO | Source: Ambulatory Visit | Attending: Internal Medicine | Admitting: Internal Medicine

## 2012-04-18 ENCOUNTER — Encounter (HOSPITAL_COMMUNITY): Payer: Self-pay | Admitting: Certified Registered"

## 2012-04-18 ENCOUNTER — Encounter (HOSPITAL_COMMUNITY)
Admission: RE | Disposition: A | Discharge status: Home / Self Care | Payer: Self-pay | Source: Ambulatory Visit | Attending: Internal Medicine

## 2012-04-18 ENCOUNTER — Ambulatory Visit (HOSPITAL_COMMUNITY): Payer: BC Managed Care – PPO | Admitting: Certified Registered"

## 2012-04-18 ENCOUNTER — Encounter (HOSPITAL_COMMUNITY): Payer: Self-pay | Admitting: *Deleted

## 2012-04-18 DIAGNOSIS — I059 Rheumatic mitral valve disease, unspecified: Secondary | ICD-10-CM | POA: Insufficient documentation

## 2012-04-18 DIAGNOSIS — E785 Hyperlipidemia, unspecified: Secondary | ICD-10-CM | POA: Insufficient documentation

## 2012-04-18 DIAGNOSIS — I4892 Unspecified atrial flutter: Secondary | ICD-10-CM

## 2012-04-18 DIAGNOSIS — Z79899 Other long term (current) drug therapy: Secondary | ICD-10-CM | POA: Insufficient documentation

## 2012-04-18 DIAGNOSIS — Z7902 Long term (current) use of antithrombotics/antiplatelets: Secondary | ICD-10-CM | POA: Insufficient documentation

## 2012-04-18 DIAGNOSIS — I4891 Unspecified atrial fibrillation: Principal | ICD-10-CM | POA: Insufficient documentation

## 2012-04-18 DIAGNOSIS — I10 Essential (primary) hypertension: Secondary | ICD-10-CM | POA: Insufficient documentation

## 2012-04-18 HISTORY — PX: ATRIAL FIBRILLATION ABLATION: SHX5456

## 2012-04-18 LAB — POCT ACTIVATED CLOTTING TIME
Activated Clotting Time: 372 seconds
Activated Clotting Time: 334 seconds
Activated Clotting Time: 160 seconds

## 2012-04-18 SURGERY — ATRIAL FIBRILLATION ABLATION
Anesthesia: Monitor Anesthesia Care

## 2012-04-18 MED ORDER — SODIUM CHLORIDE 0.9 % IJ SOLN: 3.0000 mL | INTRAMUSCULAR | Status: DC | PRN

## 2012-04-18 MED ORDER — LISINOPRIL 10 MG PO TABS
10.0000 mg | ORAL_TABLET | Freq: Every day | ORAL | Status: DC
  Administered 2012-04-18: 10 mg via ORAL
  Filled 2012-04-18 (×2): qty 1

## 2012-04-18 MED ORDER — BUPIVACAINE HCL (PF) 0.25 % IJ SOLN
INTRAMUSCULAR | Status: AC
  Filled 2012-04-18: qty 30

## 2012-04-18 MED ORDER — MENTHOL 3 MG MT LOZG
1.0000 | LOZENGE | OROMUCOSAL | Status: DC | PRN
  Filled 2012-04-18 (×2): qty 9

## 2012-04-18 MED ORDER — SODIUM CHLORIDE 0.9 % IV SOLN
1000.0000 ug | INTRAVENOUS | Status: DC | PRN
  Administered 2012-04-18: 20 ug/min via INTRAVENOUS

## 2012-04-18 MED ORDER — SODIUM CHLORIDE 0.9 % IJ SOLN
3.0000 mL | Freq: Two times a day (BID) | INTRAMUSCULAR | Status: DC
  Administered 2012-04-18 – 2012-04-19 (×3): 3 mL via INTRAVENOUS

## 2012-04-18 MED ORDER — ATORVASTATIN CALCIUM 10 MG PO TABS
10.0000 mg | ORAL_TABLET | Freq: Every day | ORAL | Status: DC
  Administered 2012-04-18: 10 mg via ORAL
  Filled 2012-04-18 (×2): qty 1

## 2012-04-18 MED ORDER — HEPARIN SODIUM (PORCINE) 1000 UNIT/ML IJ SOLN
INTRAMUSCULAR | Status: AC
Start: 2012-04-18 — End: 2012-04-18
  Filled 2012-04-18: qty 1

## 2012-04-18 MED ORDER — RIVAROXABAN 20 MG PO TABS
20.0000 mg | ORAL_TABLET | Freq: Every day | ORAL | Status: DC
Start: 2012-04-19 — End: 2012-04-19
  Filled 2012-04-18: qty 1

## 2012-04-18 MED ORDER — ARTIFICIAL TEARS OP OINT
TOPICAL_OINTMENT | OPHTHALMIC | Status: DC | PRN
  Administered 2012-04-18: 1 via OPHTHALMIC

## 2012-04-18 MED ORDER — ACETAMINOPHEN 325 MG PO TABS
650.0000 mg | ORAL_TABLET | ORAL | Status: DC | PRN
  Administered 2012-04-18: 650 mg via ORAL
  Filled 2012-04-18: qty 2

## 2012-04-18 MED ORDER — HYDROXYUREA 500 MG PO CAPS
ORAL_CAPSULE | ORAL | Status: AC
  Filled 2012-04-18: qty 1

## 2012-04-18 MED ORDER — HEPARIN SODIUM (PORCINE) 1000 UNIT/ML IJ SOLN
INTRAMUSCULAR | Status: DC | PRN
  Administered 2012-04-18 (×2): 1000 [IU] via INTRAVENOUS
  Administered 2012-04-18: 10000 [IU] via INTRAVENOUS

## 2012-04-18 MED ORDER — PROTAMINE SULFATE 10 MG/ML IV SOLN
INTRAVENOUS | Status: DC | PRN
  Administered 2012-04-18 (×4): 10 mg via INTRAVENOUS

## 2012-04-18 MED ORDER — FENTANYL CITRATE 0.05 MG/ML IJ SOLN
INTRAMUSCULAR | Status: DC | PRN
  Administered 2012-04-18 (×2): 25 ug via INTRAVENOUS
  Administered 2012-04-18: 100 ug via INTRAVENOUS
  Administered 2012-04-18 (×4): 25 ug via INTRAVENOUS

## 2012-04-18 MED ORDER — HYDROCODONE-ACETAMINOPHEN 5-325 MG PO TABS
1.0000 | ORAL_TABLET | ORAL | Status: DC | PRN
  Administered 2012-04-19: 2 via ORAL
  Filled 2012-04-18: qty 2

## 2012-04-18 MED ORDER — ONDANSETRON HCL 4 MG/2ML IJ SOLN: 4.0000 mg | Freq: Four times a day (QID) | INTRAMUSCULAR | Status: DC | PRN

## 2012-04-18 MED ORDER — SODIUM CHLORIDE 0.9 % IV SOLN: 250.0000 mL | INTRAVENOUS | Status: DC | PRN

## 2012-04-18 MED ORDER — LACTATED RINGERS IV SOLN
INTRAVENOUS | Status: DC | PRN
  Administered 2012-04-18 (×2): via INTRAVENOUS

## 2012-04-18 MED ORDER — PANTOPRAZOLE SODIUM 40 MG PO TBEC
40.0000 mg | DELAYED_RELEASE_TABLET | Freq: Every day | ORAL | Status: DC
  Administered 2012-04-19: 40 mg via ORAL
  Filled 2012-04-18: qty 1

## 2012-04-18 MED ORDER — PROPOFOL 10 MG/ML IV BOLUS
INTRAVENOUS | Status: DC | PRN
  Administered 2012-04-18: 30 mg via INTRAVENOUS
  Administered 2012-04-18: 40 mg via INTRAVENOUS
  Administered 2012-04-18 (×7): 10 mg via INTRAVENOUS
  Administered 2012-04-18: 200 mg via INTRAVENOUS
  Administered 2012-04-18: 10 mg via INTRAVENOUS
  Administered 2012-04-18: 20 mg via INTRAVENOUS
  Administered 2012-04-18 (×2): 10 mg via INTRAVENOUS
  Administered 2012-04-18: 20 mg via INTRAVENOUS
  Administered 2012-04-18: 10 mg via INTRAVENOUS
  Administered 2012-04-18: 20 mg via INTRAVENOUS
  Administered 2012-04-18: 10 mg via INTRAVENOUS
  Administered 2012-04-18 (×2): 20 mg via INTRAVENOUS

## 2012-04-18 MED ORDER — MIDAZOLAM HCL 5 MG/5ML IJ SOLN
INTRAMUSCULAR | Status: DC | PRN
  Administered 2012-04-18: 2 mg via INTRAVENOUS

## 2012-04-18 NOTE — Transfer of Care (Signed)
Immediate Anesthesia Transfer of Care Note  Patient: Kellie Reynolds  Procedure(s) Performed: Procedure(s) (LRB) with comments: ATRIAL FIBRILLATION ABLATION (N/A)  Patient Location: Cath Lab  Anesthesia Type:General  Level of Consciousness: awake, alert  and oriented  Airway & Oxygen Therapy: Patient Spontanous Breathing and Patient connected to nasal cannula oxygen  Post-op Assessment: Report given to PACU RN  Post vital signs: Reviewed and stable  Complications: No apparent anesthesia complications

## 2012-04-18 NOTE — H&P (View-Only) (Signed)
Primary Care Physician: Dr Lupe Carney  Referring Physician:  Dr Reinaldo Berber is a 57 y.o. female with a h/o paroxysmal atrial fibrillation who presents today for EP consultation.  The patient reports that she has had afib for at least 5 or 6 years.  Episodes have recently increased in frequency and duration.  She reports tachypalpitations with associated SOB, fatigue, and decreased exercise tolerance.  She has been evaluated by Dr Eldridge Dace and treated with diltiazem and flecainide.  She reports ongoing afib 3-4 times per week despite medical therapy with flecainide.  She reports that she does not well tolerate medicines and is afraid to try other AAD.  She has been wearing an event monitor recently which has documented both atrial fibrillation as well as atrial flutter. Today, she denies symptoms of chest pain, shortness of breath, orthopnea, PND, lower extremity edema, dizziness, presyncope, syncope, or neurologic sequela. The patient is tolerating medications without difficulties and is otherwise without complaint today.   Past Medical History  Diagnosis Date  . GERD (gastroesophageal reflux disease)   . HTN (hypertension)   . Hypercholesteremia   . MVP (mitral valve prolapse)   . Paroxysmal atrial fibrillation   . Skin cancer   . Fibrocystic breast disease    Past Surgical History  Procedure Date  . Tubal ligation   . Menisectomy     Right knee  . Nasal sinus surgery 1990  . Nasal polyp surgery 1990    Current Outpatient Prescriptions  Medication Sig Dispense Refill  . aspirin 325 MG EC tablet Take 325 mg by mouth daily.      Marland Kitchen atorvastatin (LIPITOR) 10 MG tablet Take 10 mg by mouth daily.      . cyanocobalamin 1000 MCG tablet Take 100 mcg by mouth daily.      . fish oil-omega-3 fatty acids 1000 MG capsule Take 1,200 mg by mouth 2 (two) times daily.      . flecainide (TAMBOCOR) 100 MG tablet Take 100 mg by mouth 2 (two) times daily.      Marland Kitchen lisinopril  (PRINIVIL,ZESTRIL) 10 MG tablet Take 10 mg by mouth daily.      . metoprolol succinate (TOPROL-XL) 50 MG 24 hr tablet Take 50 mg by mouth 2 (two) times daily. Take with or immediately following a meal.      . omeprazole (PRILOSEC) 20 MG capsule Take 20 mg by mouth daily.      . Rivaroxaban (XARELTO) 20 MG TABS Take 1 tablet (20 mg total) by mouth daily.  30 tablet  6    No Known Allergies  History   Social History  . Marital Status: Divorced    Spouse Name: N/A    Number of Children: N/A  . Years of Education: N/A   Occupational History  . Not on file.   Social History Main Topics  . Smoking status: Current Every Day Smoker -- 1.0 packs/day  . Smokeless tobacco: Never Used  . Alcohol Use: Yes     Comment: 12 beers weekly  . Drug Use: No  . Sexually Active: Not on file   Other Topics Concern  . Not on file   Social History Narrative  . No narrative on file    Family History  Problem Relation Age of Onset  . Diabetes Mother   . Hyperlipidemia Father   . Hypertension Father   . CAD Father   . Liver cancer Maternal Grandmother     ROS- All systems  are reviewed and negative except as per the HPI above  Physical Exam: Filed Vitals:   03/10/12 1606  BP: 148/76  Pulse: 54  Height: 5\' 8"  (1.727 m)  Weight: 189 lb 6.4 oz (85.911 kg)    GEN- The patient is well appearing, alert and oriented x 3 today.   Head- normocephalic, atraumatic Eyes-  Sclera clear, conjunctiva pink Ears- hearing intact Oropharynx- clear Neck- supple, no JVP Lymph- no cervical lymphadenopathy Lungs- Clear to ausculation bilaterally, normal work of breathing Heart- Regular rate and rhythm, no murmurs, rubs or gallops, PMI not laterally displaced GI- soft, NT, ND, + BS Extremities- no clubbing, cyanosis, or edema MS- no significant deformity or atrophy Skin- no rash or lesion Psych- euthymic mood, full affect Neuro- strength and sensation are intact  EKG today reveals sinus rhythm 56  bpm, PR , otherwise normal ekg Echo 04/06/04- ef 70%, mild MR, LA 37mm  Assessment and Plan:

## 2012-04-18 NOTE — Preoperative (Signed)
Beta Blockers   Reason not to administer Beta Blockers:Not Applicable, last dose 04/18/12 at 0400

## 2012-04-18 NOTE — Progress Notes (Signed)
Doing well s/p ablation Would anticipate DC to home in am. Stop flecainide (can be restarted if she has ERAF).  Continue all other medicines. She should continue uninterrupted xarelto. I will see her in 12 weeks.  Fayrene Fearing Zeriyah Wain,MD

## 2012-04-18 NOTE — Anesthesia Postprocedure Evaluation (Signed)
Anesthesia Post Note  Patient: Kellie Reynolds  Procedure(s) Performed: Procedure(s) (LRB): ATRIAL FIBRILLATION ABLATION (N/A)  Anesthesia type: general  Patient location: PACU  Post pain: Pain level controlled  Post assessment: Patient's Cardiovascular Status Stable  Last Vitals:  Filed Vitals:   04/18/12 0556  BP: 125/65  Pulse: 53  Temp: 37.1 C  Resp: 18    Post vital signs: Reviewed and stable  Level of consciousness: sedated  Complications: No apparent anesthesia complications

## 2012-04-18 NOTE — Anesthesia Preprocedure Evaluation (Addendum)
Anesthesia Evaluation   Patient awake    Reviewed: Allergy & Precautions, H&P , NPO status , Patient's Chart, lab work & pertinent test results  Airway Mallampati: II TM Distance: >3 FB Neck ROM: Full    Dental  (+) Teeth Intact and Dental Advisory Given   Pulmonary neg pulmonary ROS, sleep apnea and Continuous Positive Airway Pressure Ventilation ,    Pulmonary exam normal       Cardiovascular hypertension, Pt. on home beta blockers + dysrhythmias Atrial Fibrillation Rhythm:Irregular Rate:Normal     Neuro/Psych negative neurological ROS     GI/Hepatic Neg liver ROS, GERD-  Controlled and Medicated,  Endo/Other  negative endocrine ROS  Renal/GU negative Renal ROS     Musculoskeletal   Abdominal   Peds  Hematology negative hematology ROS (+)   Anesthesia Other Findings   Reproductive/Obstetrics                         Anesthesia Physical Anesthesia Plan  ASA: III  Anesthesia Plan: MAC and General   Post-op Pain Management:    Induction: Intravenous  Airway Management Planned: LMA and Simple Face Mask  Additional Equipment:   Intra-op Plan:   Post-operative Plan: Extubation in OR  Informed Consent: I have reviewed the patients History and Physical, chart, labs and discussed the procedure including the risks, benefits and alternatives for the proposed anesthesia with the patient or authorized representative who has indicated his/her understanding and acceptance.   Dental advisory given  Plan Discussed with: CRNA, Anesthesiologist and Surgeon  Anesthesia Plan Comments:         Anesthesia Quick Evaluation

## 2012-04-18 NOTE — Interval H&P Note (Signed)
History and Physical Interval Note:  04/18/2012 7:20 AM  Kellie Reynolds  has presented today for surgery, with the diagnosis of atrial fib  The various methods of treatment have been discussed with the patient and family. After consideration of risks, benefits and other options for treatment, the patient has consented to  Procedure(s) (LRB) with comments: ATRIAL FIBRILLATION ABLATION (N/A) as a surgical intervention .  The patient's history has been reviewed, patient examined, no change in status, stable for surgery.  I have reviewed the patient's chart and labs.  Questions were answered to the patient's satisfaction.     Hillis Range

## 2012-04-18 NOTE — Op Note (Signed)
SURGEON:  Hillis Range, MD  PREPROCEDURE DIAGNOSES: 1. Paroxysmal atrial fibrillation. 2. Typical appearing atrial flutter  POSTPROCEDURE DIAGNOSES: 1. Paroxysmal  atrial fibrillation. 2. Typical appearing atrial flutter  PROCEDURES: 1. Comprehensive electrophysiologic study. 2. Coronary sinus pacing and recording. 3. Three-dimensional mapping of (atrial fibrillation with additional mapping and ablation of atrial flutter) 4. Ablation of (atrial fibrillation with additional mapping and ablation of atrial flutter) 5. Intracardiac echocardiography. 7. Transseptal puncture of an intact septum. 8. Rotational Angiography with processing at an independent workstation 9. Arrhythmia induction with pacing with isuprel infusion  INTRODUCTION:  Kellie Reynolds is a 57 y.o. female with a history of paroxysmal atrial fibrillation and typical appearing atrial flutter who now presents for EP study and radiofrequency ablation.  The patient reports initially being diagnosed with atrial fibrillation after presenting with symptomatic palpitations and fatgiue. The patient reports increasing frequency and duration of atrial arrhythmias since that time.  The patient has failed medical therapy with flecainide and metoprolol.  The patient therefore presents today for catheter ablation of atrial fibrillation and atrial flutter.  DESCRIPTION OF PROCEDURE:  Informed written consent was obtained, and the patient was brought to the electrophysiology lab in a fasting state.  The patient was adequately sedated with intravenous medications as outlined in the anesthesia report.  The patient's left and right groins were prepped and draped in the usual sterile fashion by the EP lab staff.  Using a percutaneous Seldinger technique, two 7-French and one 11-French hemostasis sheath was placed into the right common femoral vein.   3 Dimensional Rotational Angiography: A 5 french pigtail catheter was introduced through the right  common femoral vein and advanced into the inferior venocava.  3 demential rotational angiography was then performed by power injection of 100cc of nonionic contrast.  Reprocessing at an independent work station was then performed.   This demonstrated a moderate sized left atrium with 4 separate pulmonary veins which were also moderate in size.  There were no anomalous veins or significant abnormalities.  A 3 dimensional rendering of the left atrium was then merged using [employer] onto the [employer] system and registered with intracardiac echo (see below).  The pigtail catheter was then removed.  Catheter Placement:  A 7-French Biosense Webster Decapolar coronary sinus catheter was introduced through the right common femoral vein and advanced into the coronary sinus for recording and pacing from this location.  A 6-French quadripolar Josephson catheter was introduced through the right common femoral vein and advanced into the right ventricle for recording and pacing.  This catheter was then pulled back to the His bundle location.    Initial Measurements: The patient presented to the electrophysiology lab in sinus rhythm.  His PR interval measured 193 msec with a QRS duringation of 90 msecand a QT interval of 465 msec.  The AH interval measured 118 and the HV interval measured 46 msec.     Intracardiac Echocardiography: A 10-French Biosense Webster AcuNav intracardiac echocardiography catheter was introduced through the left common femoral vein and advanced into the right atrium. Intracardiac echocardiography was performed of the left atrium, and a three-dimensional anatomical rendering of the left atrium was performed using CARTO sound technology.  The patient was noted to have a moderate sized left atrium.  The interatrial septum was prominent but not aneurysmal. All 4 pulmonary veins were visualized and noted to have separate ostia.  The pulmonary veins were moderate in size.  The left  atrial appendage was visualized and did not  reveal thrombus.   There was no evidence of pulmonary vein stenosis.   Transseptal Puncture: The middle right common femoral vein sheath was exchanged for an 8.5 [place] SL2 transseptal sheath and transseptal access was achieved in a standard fashion using a [place]brough needle under biplane fluoroscopy with intracardiac echocardiography confirmation of the transseptal puncture.  Once transseptal access had been achieved, heparin was administered intravenously and intra- arterially in order to maintain an ACT of greater than 350 seconds throughout the procedure.   Pulmonary Venography: A 6-French multipurpose angiographic catheter with guidewire was introduced through the transseptal sheath and positioned over the mouth of all 4 pulmonary veins.  Pulmonary venograms were performed by hand injection of nonionic contrast and demonstrated moderate-sized pulmonary veins (approximately 15-20 mm in size).  There was no evidence of pulmonary vein stenosis within any of the pulmonary veins.  The angiographic catheter was then removed.    3D Mapping and Ablation: The His bundle catheter was removed and in its place a 3.5 mm Biosense Webster EZ [employer] ablation catheter was advanced into the right atrium.  The transseptal sheath was pulled back into the IVC over a guidewire.  The ablation catheter was advanced across the transseptal hole using the wire as a guide.  The transseptal sheath was then re-advanced over the guidewire into the left atrium.  A duodecapolar Biosense Webster circular mapping catheter was introduced through the transseptal sheath and positioned over the mouth of all 4 pulmonary veins.  Three-dimensional electroanatomical mapping was performed using CARTO technology.  This demonstrated electrical activity within all four pulmonary veins at baseline. The patient underwent successful sequential electrical isolation and anatomical encircling of  all four pulmonary veins using radiofrequency current with a circular mapping catheter as a guide.  The ablation catheter was then pulled back into the right atrial and positioned along the cavo-tricuspid isthmus.  Mapping along the atrial side of the isthmus was performed.  This demonstrated a standard isthmus.  A series of radiofrequency applications were then delivered along the isthmus.  Complete bidirectional cavotricuspid isthmus block was achieved as confirmed by differential atrial pacing from the low lateral right atrium.  A stimulus to earliest atrial activation across the isthmus measured 168 msec bi-directionally.  The patient was observe without return of conduction through the isthmus.  Measurements Following Ablation: Following ablation, Isuprel was infused up to 20 mcg/min with no inducible atrial fibrillation, atrial tachycardia, atrial flutter, or sustained PACs. In sinus rhythm with RR interval was 934, with PR 190 msec, QRS 103 msec, and Qtc 462 msec.  Following ablation the AH interval measured with an HV interval of 44 msec. Ventricular pacing was performed, which revealed VA dissociation when pacing at 700 msec.  Rapid atrial pacing was performed, which revealed an AV Wenckebach cycle length of 380 msec.  Electroisolation was then again confirmed in all four pulmonary veins.  The procedure was therefore considered completed.  All catheters were removed, and the sheaths were aspirated and flushed.  The patient was transferred to the recovery area for sheath removal per protocol.  A limited bedside transthoracic echocardiogram revealed no pericardial effusion.  There were no early apparent complications.  CONCLUSIONS: 1. Sinus rhythm upon presentation.   2. Rotational Angiography reveals a moderate sized left atrium with four separate pulmonary veins without evidence of pulmonary vein stenosis. 3. Successful electrical isolation and anatomical encircling of all four  pulmonary veins with radiofrequency current. 4. Cavo-tricuspid isthmus ablation was performed with complete bidirectional isthmus block  achieved.  5. No inducible arrhythmias following ablation both on and off of Isuprel 6. No early apparent complications.   Fayrene Fearing Jacobi Nile,MD 10:38 AM 04/18/2012

## 2012-04-19 DIAGNOSIS — I319 Disease of pericardium, unspecified: Secondary | ICD-10-CM

## 2012-04-19 DIAGNOSIS — I4891 Unspecified atrial fibrillation: Secondary | ICD-10-CM

## 2012-04-19 MED ORDER — METOPROLOL TARTRATE 25 MG PO TABS
25.0000 mg | ORAL_TABLET | Freq: Two times a day (BID) | ORAL | Status: DC
  Administered 2012-04-19: 25 mg via ORAL
  Filled 2012-04-19: qty 1

## 2012-04-19 MED ORDER — FLECAINIDE ACETATE 100 MG PO TABS
100.0000 mg | ORAL_TABLET | Freq: Once | ORAL | Status: AC
  Administered 2012-04-19: 100 mg via ORAL
  Filled 2012-04-19 (×2): qty 1

## 2012-04-19 MED ORDER — FLECAINIDE ACETATE 100 MG PO TABS
100.0000 mg | ORAL_TABLET | Freq: Two times a day (BID) | ORAL | Status: DC
  Filled 2012-04-19: qty 1

## 2012-04-19 MED ORDER — METOPROLOL TARTRATE 50 MG PO TABS: 25.0000 mg | ORAL_TABLET | Freq: Two times a day (BID) | ORAL | Status: DC

## 2012-04-19 NOTE — Progress Notes (Signed)
  Echocardiogram 2D Echocardiogram has been performed.  Kellie Reynolds A 04/19/2012, 10:25 AM

## 2012-04-19 NOTE — Discharge Summary (Signed)
ELECTROPHYSIOLOGY DISCHARGE SUMMARY    Patient ID: Kellie Reynolds,  MRN: [id number], DOB/AGE: 1956/02/26 57 y.o.  Admit date: 04/18/2012 Discharge date: 04/19/2012  Primary Care Physician: Milus Height, [place]  Primary Cardiologist: Eldridge Dace, MD Primary EP: Johney Frame, MD  Primary Discharge Diagnosis:  1. Atrial fibrillation and atrial flutter s/p EPS +RF ablation of AFib (of all four pulmonary veins) and +RF ablation of AFlutter (cavo-tricuspid isthmus)  Secondary Discharge Diagnoses:  1. HTN 2. Dyslipidemia 3. Mild MR by TEE 04/17/2012 4. Normal LV function  Procedures This Admission:  PROCEDURES:  1. Comprehensive electrophysiologic study.  2. Coronary sinus pacing and recording.  3. Three-dimensional mapping of (atrial fibrillation with additional mapping and ablation of atrial flutter)  4. Ablation of (atrial fibrillation with additional mapping and ablation of atrial flutter)  5. Intracardiac echocardiography.  7. Transseptal puncture of an intact septum.  8. Rotational Angiography with processing at an independent workstation  9. Arrhythmia induction with pacing with isuprel infusion CONCLUSIONS:  1. Sinus rhythm upon presentation.  2. Rotational Angiography reveals a moderate sized left atrium with four separate pulmonary veins without evidence of pulmonary vein stenosis.  3. Successful electrical isolation and anatomical encircling of all four pulmonary veins with radiofrequency current.  4. Cavo-tricuspid isthmus ablation was performed with complete bidirectional isthmus block achieved.  5. No inducible arrhythmias following ablation both on and off of Isuprel  6. No early apparent complications.  History and Hospital Course:  Kellie Reynolds is a pleasant 57 year old woman with paroxysmal AFib x 5-6 years who was referred to Dr. Johney Frame on 03/19/2012. At that time she reported her AFib episodes were increasing in frequency and duration. She has symptoms of SOB, fatigue and  decreased exercise tolerance. After discussing options with Dr. Johney Frame, she elected catheter ablation. She underwent pre-procedure TEE with her primary cardiologist, Dr. Eldridge Dace, which ruled out LA/LAA thrombus. She presented yesterday for EPS +RF ablation. Please see details of the procedure as outlined above. She underwent RF ablation of all four pulmonary veins. In addition, cavo-tricuspid isthmus ablation was performed for treatment of atrial flutter. Kellie Reynolds tolerated this procedure well without any immediate complication. Overnight, she experienced intermittent AFib/flutter on telemetry. Flecainide and metoprolol were restarted. She has now converted to SR. She complained of pleuritic chest pain and an echo was done revealing normal LV function, normal wall motion, mild MR and trivial pericardial effusion. She remains hemodynamically stable. Her groin site is intact without significant bleeding or hematoma. She has been given discharge instructions including wound care and activity restrictions. She will continue all previous home medications, including flecainide and metoprolol. She will also continue PPI. She will follow-up in clinic in 12 weeks. She has been seen, examined and deemed stable for discharge today by Dr. Berton Mount.  Discharge Vitals: Blood pressure 132/75, pulse 107, temperature 99.4 F (37.4 C), temperature source Oral, resp. rate 16, height 5\' 8"  (1.727 m), weight 184 lb 15.5 oz (83.9 kg), SpO2 96.00%.   Labs: Lab Results  Component Value Date   WBC 7.5 04/12/2012   HGB 14.0 04/12/2012   HCT 41.7 04/12/2012   MCV 92.5 04/12/2012   PLT 140.0* 04/12/2012     Lab 04/12/12 1339  NA 141  K 4.9  CL 103  CO2 29  BUN 9  CREATININE 0.9  CALCIUM 9.9  PROT --  BILITOT --  ALKPHOS --  ALT --  AST --  GLUCOSE 166*    Disposition:  The patient is being discharged  in stable condition.  Follow-up:     Follow-up Information    Follow up with Hillis Range, MD. On  07/24/2012. (At 11:15 AM)    Contact information:   [address] [place] [address] [phone]       Discharge Medications:    Medication List     As of 04/19/2012 12:50 PM    STOP taking these medications         ibuprofen 200 MG tablet   Commonly known as: ADVIL,MOTRIN      TAKE these medications         atorvastatin 10 MG tablet   Commonly known as: LIPITOR   Take 10 mg by mouth every evening.      cyanocobalamin 1000 MCG tablet   Take 1,000 mcg by mouth every evening.      fish oil-omega-3 fatty acids 1000 MG capsule   Take 2 g by mouth every evening.      flecainide 100 MG tablet   Commonly known as: TAMBOCOR   Take 100 mg by mouth 2 (two) times daily.      lisinopril 10 MG tablet   Commonly known as: PRINIVIL,ZESTRIL   Take 10 mg by mouth every evening.      metoprolol 50 MG tablet   Commonly known as: LOPRESSOR   Take 0.5 tablets (25 mg total) by mouth 2 (two) times daily.      omeprazole 20 MG capsule   Commonly known as: PRILOSEC   Take 20 mg by mouth every morning.      Rivaroxaban 20 MG Tabs   Commonly known as: XARELTO   Take 20 mg by mouth every morning.      sodium chloride 0.65 % nasal spray   Commonly known as: OCEAN   Place 1 spray into the nose 2 (two) times daily as needed. For dryness        Duration of Discharge Encounter: Greater than 30 minutes including physician time.  Signed, Rick Duff, PA-C 04/19/2012, 12:50 PM

## 2012-04-19 NOTE — Progress Notes (Signed)
Discharged home accompanied by daughter , stable. Discharged instructions given and belongings given, stable.

## 2012-04-19 NOTE — Progress Notes (Signed)
Utilization Review Completed.Kellie Reynolds T2/07/2012   

## 2012-04-19 NOTE — Progress Notes (Addendum)
     Patient: Kellie Reynolds Date of Encounter: 04/19/2012, 8:21 AM Admit date: 04/18/2012     Subjective  Kellie Reynolds reports fatigue and soreness but is otherwise without complaints. She denies CP or SOB.    Objective  Physical Exam: Vitals: BP 118/59  Pulse 94  Temp 100 F (37.8 C) (Oral)  Resp 18  Ht 5\' 8"  (1.727 m)  Wt 184 lb 15.5 oz (83.9 kg)  BMI 28.12 kg/m2  SpO2 95% General: Well developed, well appearing 57 year old female in no acute distress. Neck: Supple. JVD not elevated. Lungs: Clear bilaterally to auscultation without wheezes, rales, or rhonchi. Breathing is unlabored. Heart: Irregulrar S1 S2 without murmurs, rubs, or gallops.  Abdomen: Soft, non-distended. Extremities: No clubbing or cyanosis. No edema.  Distal pedal pulses are 2+ and equal bilaterally. Groin site intact - no bleeding or hematoma. Neuro: Alert and oriented X 3. Moves all extremities spontaneously. No focal deficits.  Intake/Output:  Intake/Output Summary (Last 24 hours) at 04/19/12 0821 Last data filed at 04/18/12 1700  Gross per 24 hour  Intake   2100 ml  Output    300 ml  Net   1800 ml    Inpatient Medications:     . atorvastatin  10 mg Oral q1800  . lisinopril  10 mg Oral q1800  . pantoprazole  40 mg Oral Daily  . Rivaroxaban  20 mg Oral Q supper  . sodium chloride  3 mL Intravenous Q12H    Labs: No recent labs  Radiology/Studies: Dg Chest 2 View  03/28/2012  *RADIOLOGY REPORT*  Clinical Data: Shortness of breath.  Atrial fibrillation.  CHEST - 2 VIEW  Comparison: 07/20/2009  Findings: Heart and mediastinal contours are within normal limits. No focal opacities or effusions.  No acute bony abnormality. Biapical scarring.  IMPRESSION: No active cardiopulmonary disease.   Original Report Authenticated By: Charlett Nose, M.D.     Pre-procedure TEE: Normal LV function. Mild MR. No LA/LAA thrombus. No interatrial communication by Doppler. Mild TR. Moderate atherosclerosis in the  descending aorta. Normal aortic valve. No aortic stenosis. No aortic regurgitation.  12-lead ECG: this AM shows atrial flutter at 126 bpm Telemetry: atrial fibrillation w/RVR    Assessment and Plan  Atrial fibrillation/atrial flutter s/p AFib ablation yesterday - Restart flecainide and BB - Continue Xarelto   Signed, EDMISTEN, BROOKE PA-C  ERAF with now reversion back to sinus  Will discharge on flecainide   Also with pleuritic/pericarditic pain, no rub  JVP about 8-9  Will check echo

## 2012-04-19 NOTE — Discharge Summary (Signed)
Echo minimal effusion

## 2012-04-19 NOTE — Progress Notes (Signed)
PA made aware about the temp.- 100.0 orally no order given.

## 2012-07-24 ENCOUNTER — Encounter: Payer: BC Managed Care – PPO | Admitting: Internal Medicine

## 2012-07-24 ENCOUNTER — Encounter: Payer: Self-pay | Admitting: Internal Medicine

## 2012-07-24 ENCOUNTER — Ambulatory Visit (INDEPENDENT_AMBULATORY_CARE_PROVIDER_SITE_OTHER): Payer: BC Managed Care – PPO | Admitting: Internal Medicine

## 2012-07-24 VITALS — BP 130/74 | HR 67 | Ht 68.0 in | Wt 180.0 lb

## 2012-07-24 DIAGNOSIS — I4892 Unspecified atrial flutter: Secondary | ICD-10-CM

## 2012-07-24 DIAGNOSIS — I4891 Unspecified atrial fibrillation: Secondary | ICD-10-CM

## 2012-07-24 NOTE — Patient Instructions (Addendum)
Stop Flecainide if no afib in 4 weeks, ok to stop Xarelto and start Aspirin 81mg  daily  Follow up in 3 months with Dr Johney Frame.

## 2012-07-24 NOTE — Progress Notes (Signed)
PCP:  REDMON,NOELLE, PA-C Primary Cardiologist: Everette Rank, MD  The patient presents today for routine electrophysiology followup.  She is s/p PVI in February of this year.  Since that time, the patient reports doing very well.  Today, she denies symptoms of palpitations, chest pain, shortness of breath, orthopnea, PND, lower extremity edema, dizziness, presyncope, syncope, or neurologic sequela.  The patient feels that she is tolerating medications without difficulties and is otherwise without complaint today. She reports that she has had no sustained episodes of atrial fibrillation since her procedure.   Past Medical History  Diagnosis Date  . GERD (gastroesophageal reflux disease)   . HTN (hypertension)   . Hypercholesteremia   . MVP (mitral valve prolapse)   . Paroxysmal atrial fibrillation   . Skin cancer   . Fibrocystic breast disease    Past Surgical History  Procedure Laterality Date  . Tubal ligation    . Menisectomy      Right knee  . Nasal sinus surgery  1990  . Nasal polyp surgery  1990  . Tee without cardioversion  04/17/2012    Procedure: TRANSESOPHAGEAL ECHOCARDIOGRAM (TEE);  Surgeon: Corky Crafts, MD;  Location: [facility] ENDOSCOPY;  Service: Cardiovascular;  Laterality: N/A;  pre ablation  . Atrial fibrillation ablation  04/18/12    PVI by Allred    Current Outpatient Prescriptions  Medication Sig Dispense Refill  . atorvastatin (LIPITOR) 10 MG tablet Take 10 mg by mouth every evening.       . cyanocobalamin 1000 MCG tablet Take 1,000 mcg by mouth every evening.       . fish oil-omega-3 fatty acids 1000 MG capsule Take 2 g by mouth every evening.       . flecainide (TAMBOCOR) 100 MG tablet Take 100 mg by mouth 2 (two) times daily.      Marland Kitchen lisinopril (PRINIVIL,ZESTRIL) 10 MG tablet Take 10 mg by mouth every evening.       . metoprolol (LOPRESSOR) 50 MG tablet Take 0.5 tablets (25 mg total) by mouth 2 (two) times daily.  60 tablet  3  . omeprazole (PRILOSEC) 20  MG capsule Take 20 mg by mouth every morning.       . Rivaroxaban (XARELTO) 20 MG TABS Take 20 mg by mouth every morning.      . sodium chloride (OCEAN) 0.65 % nasal spray Place 1 spray into the nose 2 (two) times daily as needed. For dryness         No Known Allergies  History   Social History  . Marital Status: Divorced    Spouse Name: N/A    Number of Children: N/A  . Years of Education: N/A   Occupational History  . Not on file.   Social History Main Topics  . Smoking status: Current Every Day Smoker -- 1.00 packs/day  . Smokeless tobacco: Never Used  . Alcohol Use: Yes     Comment: 12 beers weekly  . Drug Use: No  . Sexually Active: Not on file   Other Topics Concern  . Not on file   Social History Narrative  . No narrative on file    Family History  Problem Relation Age of Onset  . Diabetes Mother   . Hyperlipidemia Father   . Hypertension Father   . CAD Father   . Liver cancer Maternal Grandmother     ROS-  All systems are reviewed and are negative except as outlined in the HPI above  Physical Exam:  Filed Vitals:   07/24/12 1531  BP: 130/74  Pulse: 67  Height: 5\' 8"  (1.727 m)  Weight: 180 lb (81.647 kg)  SpO2: 96%    GEN- The patient is well appearing, alert and oriented x 3 today.   Head- normocephalic, atraumatic Eyes-  Sclera clear, conjunctiva pink Ears- hearing intact Oropharynx- clear Neck- supple, no JVP Lymph- no cervical lymphadenopathy Lungs- Clear to ausculation bilaterally, normal work of breathing Heart- Regular rate and rhythm, no murmurs, rubs or gallops, PMI not laterally displaced GI- soft, NT, ND, + BS Extremities- no clubbing, cyanosis, or edema MS- no significant deformity or atrophy Skin- no rash or lesion Psych- euthymic mood, full affect Neuro- strength and sensation are intact  EKG: sinus rhythm 66 bpm, normal ekg  Assessment and Plan:  1. afib Doing well s/p ablation Stop flecainide If she remains in sinus  then she would like to stop xarelto (CHADS2 score is 1)  Return in 3months

## 2012-08-21 ENCOUNTER — Other Ambulatory Visit (HOSPITAL_COMMUNITY): Payer: Self-pay | Admitting: Cardiology

## 2012-09-18 ENCOUNTER — Other Ambulatory Visit (HOSPITAL_COMMUNITY): Payer: Self-pay | Admitting: Cardiology

## 2012-10-09 ENCOUNTER — Other Ambulatory Visit: Payer: Self-pay | Admitting: Internal Medicine

## 2012-10-18 ENCOUNTER — Telehealth: Payer: Self-pay | Admitting: Internal Medicine

## 2012-10-18 NOTE — Telephone Encounter (Addendum)
Patient has had a change in bowel habits also and has seen bright red blood in stools.  She is going to contact her PCP to address this issue

## 2012-10-18 NOTE — Telephone Encounter (Signed)
New Problem   Pt states she is having problems with the Xarelto. Black and bloody stool that last for several days.

## 2012-10-25 ENCOUNTER — Ambulatory Visit: Payer: BC Managed Care – PPO | Admitting: Internal Medicine

## 2012-11-06 ENCOUNTER — Encounter: Payer: Self-pay | Admitting: Internal Medicine

## 2012-11-06 ENCOUNTER — Ambulatory Visit (INDEPENDENT_AMBULATORY_CARE_PROVIDER_SITE_OTHER): Payer: BC Managed Care – PPO | Admitting: Internal Medicine

## 2012-11-06 VITALS — BP 133/77 | HR 68 | Ht 68.0 in | Wt 180.8 lb

## 2012-11-06 DIAGNOSIS — I4891 Unspecified atrial fibrillation: Secondary | ICD-10-CM

## 2012-11-06 MED ORDER — FLECAINIDE ACETATE 50 MG PO TABS: 50.0000 mg | ORAL_TABLET | Freq: Every day | ORAL | Status: DC

## 2012-11-06 NOTE — Progress Notes (Signed)
Primary Care Physician: Dr Lupe Carney  Referring Physician:  Dr Reinaldo Berber is a 57 y.o. female with a h/o paroxysmal atrial fibrillation who presents today for EP follow-up.  She reports having palpitations off of flecainide.  She has returned to flecainide and palpitations have resolved.  Today, she denies symptoms of chest pain, shortness of breath, orthopnea, PND, lower extremity edema, dizziness, presyncope, syncope, or neurologic sequela. The patient is tolerating medications without difficulties and is otherwise without complaint today.   Past Medical History  Diagnosis Date  . GERD (gastroesophageal reflux disease)   . HTN (hypertension)   . Hypercholesteremia   . MVP (mitral valve prolapse)   . Paroxysmal atrial fibrillation   . Skin cancer   . Fibrocystic breast disease    Past Surgical History  Procedure Laterality Date  . Tubal ligation    . Menisectomy      Right knee  . Nasal sinus surgery  1990  . Nasal polyp surgery  1990  . Tee without cardioversion  04/17/2012    Procedure: TRANSESOPHAGEAL ECHOCARDIOGRAM (TEE);  Surgeon: Corky Crafts, MD;  Location: [facility] ENDOSCOPY;  Service: Cardiovascular;  Laterality: N/A;  pre ablation  . Atrial fibrillation ablation  04/18/12    PVI by Tatelyn Vanhecke    Current Outpatient Prescriptions  Medication Sig Dispense Refill  . atorvastatin (LIPITOR) 10 MG tablet Take 10 mg by mouth every evening.       . cyanocobalamin 1000 MCG tablet Take 1,000 mcg by mouth every evening.       . fish oil-omega-3 fatty acids 1000 MG capsule Take 2 g by mouth every evening.       . flecainide (TAMBOCOR) 100 MG tablet Take 100 mg by mouth 2 (two) times daily.      Marland Kitchen lisinopril (PRINIVIL,ZESTRIL) 10 MG tablet Take 10 mg by mouth every evening.       . metoprolol (LOPRESSOR) 50 MG tablet TAKE 0.5 TABLETS (25 MG TOTAL) BY MOUTH 2 (TWO) TIMES DAILY.  60 tablet  3  . omeprazole (PRILOSEC) 20 MG capsule Take 20 mg by mouth every morning.        . Rivaroxaban (XARELTO) 20 MG TABS Take 20 mg by mouth every morning.      . sodium chloride (OCEAN) 0.65 % nasal spray Place 1 spray into the nose 2 (two) times daily as needed. For dryness       No current facility-administered medications for this visit.    Not on File  History   Social History  . Marital Status: Divorced    Spouse Name: N/A    Number of Children: N/A  . Years of Education: N/A   Occupational History  . Not on file.   Social History Main Topics  . Smoking status: Current Every Day Smoker -- 1.00 packs/day  . Smokeless tobacco: Never Used  . Alcohol Use: Yes     Comment: 12 beers weekly  . Drug Use: No  . Sexual Activity: Not on file   Other Topics Concern  . Not on file   Social History Narrative  . No narrative on file    Family History  Problem Relation Age of Onset  . Diabetes Mother   . Hyperlipidemia Father   . Hypertension Father   . CAD Father   . Liver cancer Maternal Grandmother     Physical Exam: Filed Vitals:   11/06/12 1051  BP: 133/77  Pulse: 68  Height: 5\' 8"  (  1.727 m)  Weight: 180 lb 12.8 oz (82.01 kg)    GEN- The patient is well appearing, alert and oriented x 3 today.   Head- normocephalic, atraumatic Eyes-  Sclera clear, conjunctiva pink Ears- hearing intact Oropharynx- clear Neck- supple, no JVP Lymph- no cervical lymphadenopathy Lungs- Clear to ausculation bilaterally, normal work of breathing Heart- Regular rate and rhythm, no murmurs, rubs or gallops, PMI not laterally displaced GI- soft, NT, ND, + BS Extremities- no clubbing, cyanosis, or edema Neuro- strength and sensation are intact  EKG today reveals sinus rhythm 64 bpm   Assessment and Plan:  1. afib Doing well s/p ablation Decrease flecainide to 50mg  BID No other changes today Her CHADS2VASC score is low.  We may be able to eventually stop anticoagulation  Return in 3 months

## 2012-11-06 NOTE — Patient Instructions (Addendum)
Your physician has recommended you make the following change in your medication:  1) Decrease Flecanide to 50mg  daily   Your physician recommends that you schedule a follow-up appointment in: 3 months with Dr. Johney Frame

## 2013-01-05 ENCOUNTER — Other Ambulatory Visit (HOSPITAL_COMMUNITY)
Admission: RE | Admit: 2013-01-05 | Discharge: 2013-01-05 | Disposition: A | Discharge status: Home / Self Care | Payer: BC Managed Care – PPO | Source: Ambulatory Visit | Attending: Family Medicine | Admitting: Family Medicine

## 2013-01-05 ENCOUNTER — Other Ambulatory Visit: Payer: Self-pay | Admitting: Family Medicine

## 2013-01-05 DIAGNOSIS — Z124 Encounter for screening for malignant neoplasm of cervix: Secondary | ICD-10-CM | POA: Insufficient documentation

## 2013-01-13 ENCOUNTER — Other Ambulatory Visit (HOSPITAL_COMMUNITY): Payer: Self-pay | Admitting: Cardiology

## 2013-01-29 ENCOUNTER — Ambulatory Visit (INDEPENDENT_AMBULATORY_CARE_PROVIDER_SITE_OTHER): Payer: BC Managed Care – PPO | Admitting: Interventional Cardiology

## 2013-01-29 ENCOUNTER — Encounter (INDEPENDENT_AMBULATORY_CARE_PROVIDER_SITE_OTHER): Payer: Self-pay

## 2013-01-29 ENCOUNTER — Encounter: Payer: Self-pay | Admitting: Interventional Cardiology

## 2013-01-29 VITALS — BP 128/78 | HR 62 | Ht 68.0 in | Wt 183.0 lb

## 2013-01-29 DIAGNOSIS — E7800 Pure hypercholesterolemia, unspecified: Secondary | ICD-10-CM

## 2013-01-29 DIAGNOSIS — I4891 Unspecified atrial fibrillation: Secondary | ICD-10-CM

## 2013-01-29 DIAGNOSIS — F17200 Nicotine dependence, unspecified, uncomplicated: Secondary | ICD-10-CM

## 2013-01-29 NOTE — Progress Notes (Signed)
Patient ID: Kellie Reynolds, female   DOB: March 04, 1956, 57 y.o.   MRN: [id number]    [address] [place], [place]  [address] Phone: [phone] Fax:  [phone]  Date:  01/29/2013   ID:  Kellie Reynolds, DOB 01-22-1956, MRN [id number]  PCP:  Lupe Carney, MD      History of Present Illness: Kellie Reynolds is a 57 y.o. female who has PAF, treated with ablation in 2/14. SHe feels that she is having minimal AFIb, once a month for a few seconds.  Walks in the neighborhood without sx.  Atrial Fibrillation F/U:  no Chest pain pressure .  no Dizziness   c/o Leg edema more at the end of the day.  c/o Palpitations as above  no Shortness of breath   Denies : Syncope.     Wt Readings from Last 3 Encounters:  01/29/13 183 lb (83.008 kg)  11/06/12 180 lb 12.8 oz (82.01 kg)  07/24/12 180 lb (81.647 kg)     Past Medical History  Diagnosis Date  . GERD (gastroesophageal reflux disease)   . HTN (hypertension)   . Hypercholesteremia   . MVP (mitral valve prolapse)   . Paroxysmal atrial fibrillation   . Skin cancer   . Fibrocystic breast disease     Current Outpatient Prescriptions  Medication Sig Dispense Refill  . flecainide (TAMBOCOR) 100 MG tablet Take 100 mg by mouth 2 (two) times daily. 50mg  in am 50mg  in pm      . atorvastatin (LIPITOR) 10 MG tablet Take 10 mg by mouth every evening.       . cyanocobalamin 1000 MCG tablet Take 1,000 mcg by mouth every evening.       . fish oil-omega-3 fatty acids 1000 MG capsule Take 2 g by mouth every evening.       Marland Kitchen lisinopril (PRINIVIL,ZESTRIL) 10 MG tablet Take 10 mg by mouth every evening.       . metoprolol (LOPRESSOR) 50 MG tablet TAKE 0.5 TABLETS (25 MG TOTAL) BY MOUTH 2 (TWO) TIMES DAILY.  60 tablet  3  . omeprazole (PRILOSEC) 20 MG capsule Take 20 mg by mouth every morning.       . Rivaroxaban (XARELTO) 20 MG TABS Take 20 mg by mouth every morning.      . sodium chloride (OCEAN) 0.65 % nasal spray Place 1 spray into  the nose 2 (two) times daily as needed. For dryness       No current facility-administered medications for this visit.    Allergies:   No Known Allergies  Social History:  The patient  reports that she has been smoking.  She has never used smokeless tobacco. She reports that she drinks alcohol. She reports that she does not use illicit drugs.   Family History:  The patient's family history includes CAD in her father; Diabetes in her mother; Hyperlipidemia in her father; Hypertension in her father; Liver cancer in her maternal grandmother.   ROS:  Please see the history of present illness.  No nausea, vomiting.  No fevers, chills.  No focal weakness.  No dysuria.   All other systems reviewed and negative.   PHYSICAL EXAM: VS:  BP 128/78  Pulse 62  Ht 5\' 8"  (1.727 m)  Wt 183 lb (83.008 kg)  BMI 27.83 kg/m2 Well nourished, well developed, in no acute distress HEENT: normal Neck: no JVD, no carotid bruits Cardiac:  normal S1, S2; RRR;  Lungs:  clear to auscultation  bilaterally, no wheezing, rhonchi or rales Abd: soft, nontender, no hepatomegaly Ext: no edema Skin: warm and dry Neuro:   no focal abnormalities noted     ASSESSMENT AND PLAN:  Atrial fibrillation  Continue Metoprolol Tartrate Tablet, 25 MG, 1 tablet, Orally, Twice a day, 90 days, 180, Refills 3 Off of Diltiazem HCl CR Capsule Extended Release 24 Hour, 120 MG, 1 capsule, Orally, once a day, 90 days, 90 Continue Flecainide Acetate Tablet, 50 MG, 1 tablet, Orally, every 12 hrs, 30 day(s), 60, Refills 11 AFib sx nearly gone. Continue Flecainide as an antiarrhythmic to maintain NSR. F/u with Dr. Johney Frame. 2. Hypercholesteremia, pure  Continue Atorvastatin Calcium Tablet, 10 MG, 1 tablet, Orally, Once a day Continue Fish Oil Capsule, 1200 MG, 2 capsules, Orally, Once a day LDL 105 in 5/14.  3. Tobacco abuse:  I recommended that she stop smoking.   Signed, Fredric Mare, MD, [facility] 01/29/2013 12:11 PM

## 2013-01-29 NOTE — Patient Instructions (Addendum)
Your physician recommends that you schedule a follow-up appointment as needed.   Your physician recommends that you continue on your current medications as directed. Please refer to the Current Medication list given to you today.  

## 2013-02-07 ENCOUNTER — Encounter: Payer: Self-pay | Admitting: Internal Medicine

## 2013-02-07 ENCOUNTER — Ambulatory Visit (INDEPENDENT_AMBULATORY_CARE_PROVIDER_SITE_OTHER): Payer: BC Managed Care – PPO | Admitting: Internal Medicine

## 2013-02-07 VITALS — BP 150/75 | HR 66 | Ht 68.0 in | Wt 185.0 lb

## 2013-02-07 DIAGNOSIS — F17200 Nicotine dependence, unspecified, uncomplicated: Secondary | ICD-10-CM

## 2013-02-07 DIAGNOSIS — I10 Essential (primary) hypertension: Secondary | ICD-10-CM

## 2013-02-07 DIAGNOSIS — I4891 Unspecified atrial fibrillation: Secondary | ICD-10-CM

## 2013-02-07 MED ORDER — LISINOPRIL 20 MG PO TABS: 20.0000 mg | ORAL_TABLET | Freq: Every day | ORAL | Status: DC

## 2013-02-07 NOTE — Patient Instructions (Signed)
Your physician wants you to follow-up in: 6 months with Dr Jacquiline Doe will receive a reminder letter in the mail two months in advance. If you don't receive a letter, please call our office to schedule the follow-up appointment.   Your physician recommends that you return for lab  :BMP in 6 weeks   Your physician has recommended you make the following change in your medication:  1) Increase Lisinopril to 20mg  daily

## 2013-02-07 NOTE — Progress Notes (Signed)
PCP:  Lupe Carney, MD Cardiologist: Everette Rank, MD  The patient presents today for routine electrophysiology followup.  Since last being seen in our clinic, the patient reports doing very well.  She is s/p PVI in February of 2014.  Post ablation, she had recurrance of palpitations and resumed her Flecainide.  I dont think that she actually had recurrence of afib.  She reports very brief palpitations lasting only several seconds.  She has had no sustained afib since her ablation.  These are unchanged with flecainide. Today, she denies symptoms of chest pain, shortness of breath, orthopnea, PND, lower extremity edema, dizziness, presyncope, syncope, or neurologic sequela.  The patient feels that she is tolerating medications without difficulties and is otherwise without complaint today.   Past Medical History  Diagnosis Date  . GERD (gastroesophageal reflux disease)   . HTN (hypertension)   . Hypercholesteremia   . MVP (mitral valve prolapse)   . Paroxysmal atrial fibrillation   . Skin cancer   . Fibrocystic breast disease    Past Surgical History  Procedure Laterality Date  . Tubal ligation    . Menisectomy      Right knee  . Nasal sinus surgery  1990  . Nasal polyp surgery  1990  . Tee without cardioversion  04/17/2012    Procedure: TRANSESOPHAGEAL ECHOCARDIOGRAM (TEE);  Surgeon: Corky Crafts, MD;  Location: [facility] ENDOSCOPY;  Service: Cardiovascular;  Laterality: N/A;  pre ablation  . Atrial fibrillation ablation  04/18/12    PVI by Aribella Vavra    Current Outpatient Prescriptions  Medication Sig Dispense Refill  . atorvastatin (LIPITOR) 10 MG tablet Take 10 mg by mouth every evening.       . cyanocobalamin 1000 MCG tablet Take 1,000 mcg by mouth every evening.       . fish oil-omega-3 fatty acids 1000 MG capsule Take 2 g by mouth every evening.       . flecainide (TAMBOCOR) 100 MG tablet Take 50 mg by mouth 2 (two) times daily. 50mg  in am 50mg  in pm      . lisinopril  (PRINIVIL,ZESTRIL) 10 MG tablet Take 10 mg by mouth every evening.       . metoprolol (LOPRESSOR) 50 MG tablet TAKE 0.5 TABLETS (25 MG TOTAL) BY MOUTH 2 (TWO) TIMES DAILY.  60 tablet  3  . omeprazole (PRILOSEC) 20 MG capsule Take 20 mg by mouth every morning.       . Rivaroxaban (XARELTO) 20 MG TABS Take 20 mg by mouth every morning.      . sodium chloride (OCEAN) 0.65 % nasal spray Place 1 spray into the nose 2 (two) times daily as needed. For dryness       No current facility-administered medications for this visit.    No Known Allergies  History   Social History  . Marital Status: Divorced    Spouse Name: N/A    Number of Children: N/A  . Years of Education: N/A   Occupational History  . Not on file.   Social History Main Topics  . Smoking status: Current Every Day Smoker -- 1.00 packs/day  . Smokeless tobacco: Never Used  . Alcohol Use: Yes     Comment: 12 beers weekly  . Drug Use: No  . Sexual Activity: Not on file   Other Topics Concern  . Not on file   Social History Narrative  . No narrative on file    Family History  Problem Relation Age of Onset  .  Diabetes Mother   . Hyperlipidemia Father   . Hypertension Father   . CAD Father   . Liver cancer Maternal Grandmother     Physical Exam: Filed Vitals:   02/07/13 1601  BP: 150/75  Pulse: 66  Height: 5\' 8"  (1.727 m)  Weight: 185 lb (83.915 kg)    GEN- The patient is well appearing, alert and oriented x 3 today.   Head- normocephalic, atraumatic Eyes-  Sclera clear, conjunctiva pink Ears- hearing intact Oropharynx- clear Neck- supple, no JVP Lymph- no cervical lymphadenopathy Lungs- Clear to ausculation bilaterally, normal work of breathing Heart- Regular rate and rhythm, no murmurs, rubs or gallops, PMI not laterally displaced GI- soft, NT, ND, + BS Extremities- no clubbing, cyanosis, or edema MS- no significant deformity or atrophy Skin- no rash or lesion Psych- euthymic mood, full  affect Neuro- strength and sensation are intact  EKG - sinus rhythm, rate 66, normal intervals   Assessment and Plan:  1.  Atrial fibrillation Doing well s/p ablation, without recurrence She has had short palpitations likely due to PACs Stop flecainide chads2vasc score is 2.  Continue anticoagulation at this time.  2.  Hypertension Above goal Increase lisinopril to 20mg  daily Repeat BMET in 6 weeks  3.  Tobacco abuse Patient has been strongly advised to consider cessation.  She is not ready to quit.   Return in 6 months

## 2013-03-03 ENCOUNTER — Other Ambulatory Visit: Payer: Self-pay | Admitting: Interventional Cardiology

## 2013-03-19 ENCOUNTER — Ambulatory Visit (INDEPENDENT_AMBULATORY_CARE_PROVIDER_SITE_OTHER): Payer: BC Managed Care – PPO | Admitting: *Deleted

## 2013-03-19 DIAGNOSIS — I4891 Unspecified atrial fibrillation: Secondary | ICD-10-CM

## 2013-03-19 DIAGNOSIS — I10 Essential (primary) hypertension: Secondary | ICD-10-CM

## 2013-03-20 LAB — BASIC METABOLIC PANEL
BUN: 10 mg/dL (ref 6–23)
CHLORIDE: 107 meq/L (ref 96–112)
CO2: 25 mEq/L (ref 19–32)
Calcium: 9 mg/dL (ref 8.4–10.5)
Creatinine, Ser: 0.8 mg/dL (ref 0.4–1.2)
GFR: 75.38 mL/min (ref >60.00)
Glucose, Bld: 104 mg/dL — ABNORMAL HIGH (ref 70–99)
POTASSIUM: 4.2 meq/L (ref 3.5–5.1)
SODIUM: 141 meq/L (ref 135–145)

## 2013-05-18 ENCOUNTER — Other Ambulatory Visit: Payer: Self-pay | Admitting: Internal Medicine

## 2013-05-25 ENCOUNTER — Other Ambulatory Visit: Payer: Self-pay | Admitting: General Surgery

## 2013-05-25 DIAGNOSIS — G4733 Obstructive sleep apnea (adult) (pediatric): Secondary | ICD-10-CM

## 2013-05-27 NOTE — Progress Notes (Signed)
What is this in reference to?

## 2013-05-28 NOTE — Progress Notes (Signed)
[facility] needs a rx for current CPAP supplies. Ok to send in?

## 2013-05-28 NOTE — Progress Notes (Signed)
Yes ok to order  

## 2013-05-29 NOTE — Progress Notes (Signed)
Ordered for pt

## 2013-06-05 ENCOUNTER — Telehealth: Payer: Self-pay | Admitting: Internal Medicine

## 2013-06-05 NOTE — Telephone Encounter (Signed)
New message   Patient just wanted the nurse to know.    Patient calling went back on her original dosage - flecainide 100 mg in  100 mg in pm.  Last blood pressure was  164/109 .    On last night took 100 mg pm dosage . 100 mg am dosage this am . Feeling fine. Definitely keep her in rhythm.

## 2013-06-06 NOTE — Telephone Encounter (Signed)
Will show to Dr Rayann Heman to make sure he is okay with this

## 2013-06-12 NOTE — Telephone Encounter (Signed)
Discussed with Dr Rayann Heman, he is ok with her dose of Flecainide and ask that she keep a close watch on her BP with the increase of Lisinopril to 20mg  daily.  Follow up with Dr Alroy Dust or Dr Irish Lack to insure it is coming down.

## 2013-06-18 ENCOUNTER — Telehealth: Payer: Self-pay | Admitting: *Deleted

## 2013-06-18 NOTE — Telephone Encounter (Signed)
PA to [facility] for patients xarelto

## 2013-06-18 NOTE — Telephone Encounter (Signed)
[facility] approved the xarelto dates  05/19/2012---06/18/2014, pharmacy notified.

## 2013-07-09 ENCOUNTER — Other Ambulatory Visit: Payer: Self-pay

## 2013-07-09 MED ORDER — ATORVASTATIN CALCIUM 10 MG PO TABS: 10.0000 mg | ORAL_TABLET | Freq: Every evening | ORAL | Status: DC

## 2013-07-15 ENCOUNTER — Ambulatory Visit (INDEPENDENT_AMBULATORY_CARE_PROVIDER_SITE_OTHER): Payer: BC Managed Care – PPO | Admitting: Emergency Medicine

## 2013-07-15 ENCOUNTER — Encounter (HOSPITAL_COMMUNITY): Payer: Self-pay | Admitting: Emergency Medicine

## 2013-07-15 ENCOUNTER — Emergency Department (HOSPITAL_COMMUNITY): Payer: BC Managed Care – PPO

## 2013-07-15 ENCOUNTER — Emergency Department (HOSPITAL_COMMUNITY)
Admission: EM | Admit: 2013-07-15 | Discharge: 2013-07-15 | Disposition: A | Discharge status: Home / Self Care | Payer: BC Managed Care – PPO | Attending: Emergency Medicine | Admitting: Emergency Medicine

## 2013-07-15 VITALS — BP 160/88 | HR 139 | Temp 98.5°F | Ht 67.25 in | Wt 185.0 lb

## 2013-07-15 DIAGNOSIS — Z8742 Personal history of other diseases of the female genital tract: Secondary | ICD-10-CM | POA: Insufficient documentation

## 2013-07-15 DIAGNOSIS — Z79899 Other long term (current) drug therapy: Secondary | ICD-10-CM | POA: Insufficient documentation

## 2013-07-15 DIAGNOSIS — I10 Essential (primary) hypertension: Secondary | ICD-10-CM | POA: Insufficient documentation

## 2013-07-15 DIAGNOSIS — I4891 Unspecified atrial fibrillation: Secondary | ICD-10-CM | POA: Insufficient documentation

## 2013-07-15 DIAGNOSIS — M25579 Pain in unspecified ankle and joints of unspecified foot: Secondary | ICD-10-CM

## 2013-07-15 DIAGNOSIS — K219 Gastro-esophageal reflux disease without esophagitis: Secondary | ICD-10-CM | POA: Insufficient documentation

## 2013-07-15 DIAGNOSIS — X500XXA Overexertion from strenuous movement or load, initial encounter: Secondary | ICD-10-CM | POA: Insufficient documentation

## 2013-07-15 DIAGNOSIS — S92309A Fracture of unspecified metatarsal bone(s), unspecified foot, initial encounter for closed fracture: Secondary | ICD-10-CM | POA: Insufficient documentation

## 2013-07-15 DIAGNOSIS — S92353A Displaced fracture of fifth metatarsal bone, unspecified foot, initial encounter for closed fracture: Secondary | ICD-10-CM

## 2013-07-15 DIAGNOSIS — Y9289 Other specified places as the place of occurrence of the external cause: Secondary | ICD-10-CM | POA: Insufficient documentation

## 2013-07-15 DIAGNOSIS — Z7901 Long term (current) use of anticoagulants: Secondary | ICD-10-CM | POA: Insufficient documentation

## 2013-07-15 DIAGNOSIS — Z85828 Personal history of other malignant neoplasm of skin: Secondary | ICD-10-CM | POA: Insufficient documentation

## 2013-07-15 DIAGNOSIS — Y9389 Activity, other specified: Secondary | ICD-10-CM | POA: Insufficient documentation

## 2013-07-15 DIAGNOSIS — Z9889 Other specified postprocedural states: Secondary | ICD-10-CM | POA: Insufficient documentation

## 2013-07-15 DIAGNOSIS — E7800 Pure hypercholesterolemia, unspecified: Secondary | ICD-10-CM | POA: Insufficient documentation

## 2013-07-15 DIAGNOSIS — F17200 Nicotine dependence, unspecified, uncomplicated: Secondary | ICD-10-CM | POA: Insufficient documentation

## 2013-07-15 LAB — I-STAT CHEM 8, ED
BUN: 7 mg/dL (ref 6–23)
Calcium, Ion: 1.21 mmol/L (ref 1.12–1.23)
Chloride: 105 mEq/L (ref 96–112)
Creatinine, Ser: 0.8 mg/dL (ref 0.50–1.10)
Glucose, Bld: 126 mg/dL — ABNORMAL HIGH (ref 70–99)
HCT: 38 % (ref 36.0–46.0)
Hemoglobin: 12.9 g/dL (ref 12.0–15.0)
Potassium: 3.9 mEq/L (ref 3.7–5.3)
Sodium: 144 mEq/L (ref 137–147)
TCO2: 28 mmol/L (ref 0–100)

## 2013-07-15 MED ORDER — DILTIAZEM HCL 25 MG/5ML IV SOLN
15.0000 mg | Freq: Once | INTRAVENOUS | Status: AC
  Administered 2013-07-15: 15 mg via INTRAVENOUS
  Filled 2013-07-15: qty 5

## 2013-07-15 MED ORDER — HYDROCODONE-ACETAMINOPHEN 5-325 MG PO TABS: 1.0000 | ORAL_TABLET | Freq: Four times a day (QID) | ORAL | Status: DC | PRN

## 2013-07-15 NOTE — ED Provider Notes (Signed)
CSN: [id number]     Arrival date & time 07/15/13  1332 History   First MD Initiated Contact with Patient 07/15/13 1337     Chief Complaint  Patient presents with  . Atrial Fibrillation  . Foot Injury     (Consider location/radiation/quality/duration/timing/severity/associated sxs/prior Treatment) HPI Patient presents to the emergency department with a right foot injury and atrial fibrillation.  The patient, states she has chronic history of atrial fibrillation.  She states that she had an episode of uncontrolled A. fib last week, took an extra metoprolol and it resolved.  Patient, states, that she was at an urgent care today, when she was noted to be in atrial fibrillation, and they called EMS to bring her to the hospital.  The patient has no symptoms.  Patient denies chest pain, shortness breath, nausea, vomiting, diarrhea, headache, blurred vision, weakness, numbness, dizziness, fever, or syncope.  Patient, states, that she stepped into a rut from a tire.  Impression yesterday and twisting her foot. Past Medical History  Diagnosis Date  . GERD (gastroesophageal reflux disease)   . HTN (hypertension)   . Hypercholesteremia   . MVP (mitral valve prolapse)   . Paroxysmal atrial fibrillation   . Skin cancer   . Fibrocystic breast disease    Past Surgical History  Procedure Laterality Date  . Tubal ligation    . Menisectomy      Right knee  . Nasal sinus surgery  1990  . Nasal polyp surgery  1990  . Tee without cardioversion  04/17/2012    Procedure: TRANSESOPHAGEAL ECHOCARDIOGRAM (TEE);  Surgeon: Jettie Booze, MD;  Location: [place];  Service: Cardiovascular;  Laterality: N/A;  pre ablation  . Atrial fibrillation ablation  04/18/12    PVI by Allred  . Cardiac electrophysiology study and ablation     Family History  Problem Relation Age of Onset  . Diabetes Mother   . Hyperlipidemia Father   . Hypertension Father   . CAD Father   . Liver cancer Maternal Grandmother     History  Substance Use Topics  . Smoking status: Current Every Day Smoker -- 1.00 packs/day  . Smokeless tobacco: Never Used  . Alcohol Use: Yes     Comment: 12 beers weekly   OB History   Grav Para Term Preterm Abortions TAB SAB Ect Mult Living                 Review of Systems All other systems negative except as documented in the HPI. All pertinent positives and negatives as reviewed in the HPI.    Allergies  Review of patient's allergies indicates no known allergies.  Home Medications   Prior to Admission medications   Medication Sig Start Date End Date Taking? Authorizing Provider  atorvastatin (LIPITOR) 10 MG tablet Take 1 tablet (10 mg total) by mouth every evening. 07/09/13   Jettie Booze, MD  cyanocobalamin 1000 MCG tablet Take 1,000 mcg by mouth every evening.     Historical Provider, MD  fish oil-omega-3 fatty acids 1000 MG capsule Take 2 g by mouth every evening.     Historical Provider, MD  flecainide (TAMBOCOR) 100 MG tablet TAKE 1 TABLET EVERY 12 HRS ORALLY 03/03/13   Jettie Booze, MD  lisinopril (PRINIVIL,ZESTRIL) 20 MG tablet Take 1 tablet (20 mg total) by mouth daily. 02/07/13   Thompson Grayer, MD  metoprolol (LOPRESSOR) 50 MG tablet TAKE 0.5 TABLETS (25 MG TOTAL) BY MOUTH 2 (TWO) TIMES DAILY. 08/21/12  Brooke O Edmisten, PA-C  omeprazole (PRILOSEC) 20 MG capsule Take 20 mg by mouth every morning.     Historical Provider, MD  Rivaroxaban (XARELTO) 20 MG TABS Take 20 mg by mouth every morning. 03/10/12   Thompson Grayer, MD  sodium chloride (OCEAN) 0.65 % nasal spray Place 1 spray into the nose 2 (two) times daily as needed. For dryness    Historical Provider, MD  XARELTO 20 MG TABS tablet TAKE 1 TABLET BY MOUTH EVERY DAY 05/18/13   Thompson Grayer, MD   There were no vitals taken for this visit. Physical Exam  Nursing note and vitals reviewed. Constitutional: She is oriented to person, place, and time. She appears well-developed and well-nourished. No  distress.  HENT:  Head: Normocephalic and atraumatic.  Mouth/Throat: Oropharynx is clear and moist.  Eyes: Pupils are equal, round, and reactive to light.  Neck: Normal range of motion. Neck supple.  Cardiovascular: Normal heart sounds and normal pulses.  An irregularly irregular rhythm present. Tachycardia present.  Exam reveals no gallop and no friction rub.   No murmur heard. Pulmonary/Chest: Effort normal and breath sounds normal. No respiratory distress.  Musculoskeletal:       Right foot: She exhibits decreased range of motion, tenderness and swelling. She exhibits no bony tenderness, normal capillary refill, no crepitus, no deformity and no laceration.       Feet:  Neurological: She is alert and oriented to person, place, and time.  Skin: Skin is warm and dry.    ED Course  Procedures (including critical care time)  Patient has converted to normal sinus rhythm on Cardizem bolus the patient is feeling symptoms   Date: 07/15/2013  Rate: 136  Rhythm: atrial fibrillation  QRS Axis: normal  Intervals: normal  ST/T Wave abnormalities: normal  Conduction Disutrbances:none  Narrative Interpretation:   Old EKG Reviewed: unchanged and changes noted   Date: 07/15/2013 14:54  Rate: 92  Rhythm: normal sinus rhythm  QRS Axis: normal  Intervals: normal  ST/T Wave abnormalities: normal  Conduction Disutrbances:none  Narrative Interpretation:   Old EKG Reviewed: changes noted   Patient will be sent to orthopedics, for followup.  Patient be placed in a short posterior splint crutches given as well.  Patient voices an understanding and all questions were answered and the patient converted from A. fib with a bolus of 15 of [place], [place] 07/18/13 [phone]

## 2013-07-15 NOTE — Discharge Instructions (Signed)
Return here as needed.  Follow-up with the orthopedist provided.  Ice and elevate your foot °

## 2013-07-15 NOTE — Progress Notes (Signed)
Orthopedic Tech Progress Note Patient Details:  Kellie Reynolds September 09, 1955 [id number]  Ortho Devices Type of Ortho Device: CAM walker;Crutches Ortho Device/Splint Location: RLE Ortho Device/Splint Interventions: Ordered;Application   Braulio Bosch 07/15/2013, 3:37 PM

## 2013-07-15 NOTE — ED Notes (Signed)
PA at bedside.

## 2013-07-15 NOTE — Progress Notes (Signed)
[facility] [address], [place] [address] [phone]  Date:  07/15/2013   Name:  Kellie Reynolds   DOB:  Sep 20, 1955   MRN:  [id number]  PCP:  Donnie Coffin, MD    Chief Complaint: foot broken   History of Present Illness:  Kellie Reynolds is a 58 y.o. very pleasant female patient who presents with the following:  Patient was in [place] and stepped out of of a camper and twisted her ankle.  She has marked pain and swelling in the lateral ankle.  Pain with ambulation.   While returning from [place], she developed a very rapid rate associated with shortness of breath.  On arrival here was found to be in atrial fibrillation with a rapid ventricular rate of 155-170.  Has no chest pain or pressure.  History of frequent episodes of escape and had undergone an ablation and has been on antiarrhythmics. Denies other complaint or health concern today.   Patient Active Problem List   Diagnosis Date Noted  . Tobacco use disorder 01/29/2013  . Atrial flutter 03/19/2012  . Atrial fibrillation 05/10/2011  . High cholesterol 05/10/2011  . Hypertension 05/10/2011    Past Medical History  Diagnosis Date  . GERD (gastroesophageal reflux disease)   . HTN (hypertension)   . Hypercholesteremia   . MVP (mitral valve prolapse)   . Paroxysmal atrial fibrillation   . Skin cancer   . Fibrocystic breast disease     Past Surgical History  Procedure Laterality Date  . Tubal ligation    . Menisectomy      Right knee  . Nasal sinus surgery  1990  . Nasal polyp surgery  1990  . Tee without cardioversion  04/17/2012    Procedure: TRANSESOPHAGEAL ECHOCARDIOGRAM (TEE);  Surgeon: Jettie Booze, MD;  Location: [place] [facility];  Service: Cardiovascular;  Laterality: N/A;  pre ablation  . Atrial fibrillation ablation  04/18/12    PVI by Allred  . Cardiac electrophysiology study and ablation      History  Substance Use Topics  . Smoking status: Current Every Day Smoker  -- 1.00 packs/day  . Smokeless tobacco: Never Used  . Alcohol Use: Yes     Comment: 12 beers weekly    Family History  Problem Relation Age of Onset  . Diabetes Mother   . Hyperlipidemia Father   . Hypertension Father   . CAD Father   . Liver cancer Maternal Grandmother     No Known Allergies  Medication list has been reviewed and updated.  Current Outpatient Prescriptions on File Prior to Visit  Medication Sig Dispense Refill  . atorvastatin (LIPITOR) 10 MG tablet Take 1 tablet (10 mg total) by mouth every evening.  30 tablet  2  . cyanocobalamin 1000 MCG tablet Take 1,000 mcg by mouth every evening.       . fish oil-omega-3 fatty acids 1000 MG capsule Take 2 g by mouth every evening.       . flecainide (TAMBOCOR) 100 MG tablet TAKE 1 TABLET EVERY 12 HRS ORALLY  60 tablet  11  . lisinopril (PRINIVIL,ZESTRIL) 20 MG tablet Take 1 tablet (20 mg total) by mouth daily.  90 tablet  3  . metoprolol (LOPRESSOR) 50 MG tablet TAKE 0.5 TABLETS (25 MG TOTAL) BY MOUTH 2 (TWO) TIMES DAILY.  60 tablet  3  . omeprazole (PRILOSEC) 20 MG capsule Take 20 mg by mouth every morning.       . Rivaroxaban (XARELTO)  20 MG TABS Take 20 mg by mouth every morning.      . sodium chloride (OCEAN) 0.65 % nasal spray Place 1 spray into the nose 2 (two) times daily as needed. For dryness      . XARELTO 20 MG TABS tablet TAKE 1 TABLET BY MOUTH EVERY DAY  30 tablet  1   No current facility-administered medications on file prior to visit.    Review of Systems:  As per HPI, otherwise negative.    Physical Examination: Filed Vitals:   07/15/13 1219  BP: 160/88  Pulse: 139  Temp: 98.5 F (36.9 C)   Filed Vitals:   07/15/13 1219  Height: 5' 7.25" (1.708 m)  Weight: 185 lb (83.915 kg)   Body mass index is 28.76 kg/(m^2). Ideal Body Weight: Weight in (lb) to have BMI = 25: 160.5  GEN: WDWN, NAD, Non-toxic, A & O x 3 HEENT: Atraumatic, Normocephalic. Neck supple. No masses, No LAD. Ears and Nose: No  external deformity. CV: IRRR, No M/G/R. No JVD. No thrill. No extra heart sounds. PULM: CTA B, no wheezes, crackles, rhonchi. No retractions. No resp. distress. No accessory muscle use. ABD: S, NT, ND, +BS. No rebound. No HSM. EXTR: No c/c/e  Tender, swollen right ankle and forefoot NEURO Normal gait.  PSYCH: Normally interactive. Conversant. Not depressed or anxious appearing.  Calm demeanor.    Assessment and Plan: Atrial fibrillation, unstable Rapid ventricular rate Foot and ankle injury not evaluated   Signed,  Ellison Carwin, MD   To ER via EMS

## 2013-07-15 NOTE — ED Notes (Addendum)
Per EMS- pt was at urgent care to be seen for a right foot injury and was noted to be in a fib. Pt has hx of same. Pt states that she felt palpitations. Pt does have swelling and bruising to right foot. States that she was walking and her ankle "rolled" and she heard a pop yesterday.

## 2013-07-15 NOTE — ED Notes (Signed)
Ortho tech at bedside 

## 2013-07-18 ENCOUNTER — Other Ambulatory Visit: Payer: Self-pay | Admitting: Internal Medicine

## 2013-07-19 NOTE — ED Provider Notes (Signed)
Medical screening examination/treatment/procedure(s) were performed by non-physician practitioner and as supervising physician I was immediately available for consultation/collaboration.   EKG Interpretation   Date/Time:  Sunday Jul 15 2013 14:54:30 EDT Ventricular Rate:  92 PR Interval:  210 QRS Duration: 84 QT Interval:  353 QTC Calculation: 437 R Axis:   16 Text Interpretation:  Sinus rhythm Prolonged PR interval ED PHYSICIAN  INTERPRETATION AVAILABLE IN [facility] Confirmed by TEST, Record  ([id number]) on 07/17/2013 7:27:19 AM        Saddie Benders. Dorna Mai, MD 07/19/13 2303

## 2013-07-30 ENCOUNTER — Telehealth: Payer: Self-pay | Admitting: Internal Medicine

## 2013-07-30 NOTE — Telephone Encounter (Signed)
I spoke with the pt and she had a spell Friday and Sunday night when low back pain awoke her from her sleep.  The pt said she got out of bed and walked around to help relieve pain and she developed nausea and vomiting.  The pt's BP Friday night was 207/165 and last night 189/150, pulse in the 70s. The pt took 25mg  of Metoprolol with both episodes and her back pain eased off, BP went down and she was able to go back to sleep.   The pt does not have a history of back surgery or injury and no history of kidney stones.  The pt is not having any pain with urination. I made the pt aware that the back pain can cause increased BP and nausea and vomiting. I made the pt aware that she needs to contact her PCP for evaluation to determine cause of back pain.  If they feel the pt needs to see Cardiology then we can arrange appointment. I also advised the pt to take 1/2 Lisinopril instead of Metoprolol. Pt agreed with plan.

## 2013-07-30 NOTE — Telephone Encounter (Signed)
New message     Last night---woke with extreme back pain and threw up.  bp was 205/167.  Pt took an extra 1/2 tablet of metoprolol.  After an hour bp went down and back pain subsided and pt went to sleep. Pt had no other symptoms.  What do you think?

## 2013-07-30 NOTE — Telephone Encounter (Signed)
FYI to Dr. Varanasi.  

## 2013-07-30 NOTE — Telephone Encounter (Signed)
TO Triage .

## 2013-09-05 ENCOUNTER — Other Ambulatory Visit (HOSPITAL_COMMUNITY): Payer: Self-pay | Admitting: Cardiology

## 2013-09-09 ENCOUNTER — Other Ambulatory Visit (HOSPITAL_COMMUNITY): Payer: Self-pay | Admitting: Cardiology

## 2013-09-19 ENCOUNTER — Ambulatory Visit: Payer: BC Managed Care – PPO | Admitting: Internal Medicine

## 2013-10-03 ENCOUNTER — Ambulatory Visit (INDEPENDENT_AMBULATORY_CARE_PROVIDER_SITE_OTHER): Payer: BC Managed Care – PPO | Admitting: Internal Medicine

## 2013-10-03 ENCOUNTER — Encounter: Payer: Self-pay | Admitting: Internal Medicine

## 2013-10-03 VITALS — BP 125/56 | HR 61 | Ht 68.0 in | Wt 192.8 lb

## 2013-10-03 DIAGNOSIS — F17200 Nicotine dependence, unspecified, uncomplicated: Secondary | ICD-10-CM

## 2013-10-03 DIAGNOSIS — I10 Essential (primary) hypertension: Secondary | ICD-10-CM

## 2013-10-03 DIAGNOSIS — I4891 Unspecified atrial fibrillation: Secondary | ICD-10-CM

## 2013-10-03 MED ORDER — FLECAINIDE ACETATE 100 MG PO TABS: 50.0000 mg | ORAL_TABLET | Freq: Two times a day (BID) | ORAL | Status: DC

## 2013-10-03 NOTE — Patient Instructions (Signed)
Your physician wants you to follow-up in: 6 months with Dr Rayann Heman. You will receive a reminder letter in the mail two months in advance. If you don't receive a letter, please call our office to schedule the follow-up appointment.  Your physician has recommended you make the following change in your medication:  1) Decrease Flecainide to 50mg  twice daily

## 2013-10-07 ENCOUNTER — Encounter: Payer: Self-pay | Admitting: Internal Medicine

## 2013-10-07 NOTE — Progress Notes (Signed)
PCP:  Donnie Coffin, MD Cardiologist: Casandra Doffing, MD  The patient presents today for routine electrophysiology followup.  Since last being seen in our clinic, the patient reports doing very well.  She is s/p PVI in February of 2014.  She is having no afib and is pleased with her results. Today, she denies symptoms of chest pain, shortness of breath, orthopnea, PND, lower extremity edema, dizziness, presyncope, syncope, or neurologic sequela.  The patient feels that she is tolerating medications without difficulties and is otherwise without complaint today.   Past Medical History  Diagnosis Date  . GERD (gastroesophageal reflux disease)   . HTN (hypertension)   . Hypercholesteremia   . MVP (mitral valve prolapse)   . Paroxysmal atrial fibrillation   . Skin cancer   . Fibrocystic breast disease    Past Surgical History  Procedure Laterality Date  . Tubal ligation    . Menisectomy      Right knee  . Nasal sinus surgery  1990  . Nasal polyp surgery  1990  . Tee without cardioversion  04/17/2012    Procedure: TRANSESOPHAGEAL ECHOCARDIOGRAM (TEE);  Surgeon: Jettie Booze, MD;  Location: [facility];  Service: Cardiovascular;  Laterality: N/A;  pre ablation  . Atrial fibrillation ablation  04/18/12    PVI by Megahn Killings  . Cardiac electrophysiology study and ablation      Current Outpatient Prescriptions  Medication Sig Dispense Refill  . atorvastatin (LIPITOR) 10 MG tablet Take 1 tablet (10 mg total) by mouth every evening.  30 tablet  2  . cyanocobalamin 1000 MCG tablet Take 1,000 mcg by mouth every evening.       . fish oil-omega-3 fatty acids 1000 MG capsule Take 2 g by mouth every evening.       . flecainide (TAMBOCOR) 100 MG tablet Take 0.5 tablets (50 mg total) by mouth 2 (two) times daily.      Marland Kitchen lisinopril (PRINIVIL,ZESTRIL) 20 MG tablet Take 1 tablet (20 mg total) by mouth daily.  90 tablet  3  . metoprolol (LOPRESSOR) 50 MG tablet Take 25 mg by mouth 2 (two) times daily.       Marland Kitchen omeprazole (PRILOSEC) 20 MG capsule Take 20 mg by mouth every morning.       . sodium chloride (OCEAN) 0.65 % nasal spray Place 1 spray into the nose 2 (two) times daily as needed. For dryness      . XARELTO 20 MG TABS tablet TAKE 1 TABLET BY MOUTH EVERY DAY  30 tablet  6   No current facility-administered medications for this visit.    No Known Allergies  History   Social History  . Marital Status: Divorced    Spouse Name: N/A    Number of Children: N/A  . Years of Education: N/A   Occupational History  . Not on file.   Social History Main Topics  . Smoking status: Current Every Day Smoker -- 1.00 packs/day  . Smokeless tobacco: Never Used  . Alcohol Use: Yes     Comment: 12 beers weekly  . Drug Use: No  . Sexual Activity: Not on file   Other Topics Concern  . Not on file   Social History Narrative  . No narrative on file   ROS- all systems are reviewed and negative except as per HPI above  Family History  Problem Relation Age of Onset  . Diabetes Mother   . Hyperlipidemia Father   . Hypertension Father   . CAD Father   .  Liver cancer Maternal Grandmother     Physical Exam: Filed Vitals:   10/03/13 1512  BP: 125/56  Pulse: 61  Height: 5\' 8"  (1.727 m)  Weight: 192 lb 12.8 oz (87.454 kg)    GEN- The patient is well appearing, alert and oriented x 3 today.   Head- normocephalic, atraumatic Eyes-  Sclera clear, conjunctiva pink Ears- hearing intact Oropharynx- clear Neck- supple,   Lungs- Clear to ausculation bilaterally, normal work of breathing Heart- Regular rate and rhythm, no murmurs, rubs or gallops, PMI not laterally displaced GI- soft, NT, ND, + BS Extremities- no clubbing, cyanosis, or edema Neuro- strength and sensation are intact  EKG - sinus rhythm, rate 61, normal intervals   Assessment and Plan:  1.  Atrial fibrillation Doing well s/p ablation, without recurrence She has had short palpitations likely due to PACs She is afraid  to stop flecainide.  I have recommended that she try reducing it to 50mg  BID. chads2vasc score is 2.  Continue anticoagulation at this time.  2.  Hypertension Stable No change required today  3.  Tobacco abuse Patient has been strongly advised to consider cessation.  She is not ready to quit.   Return in 6 months

## 2013-10-08 ENCOUNTER — Other Ambulatory Visit: Payer: Self-pay | Admitting: Interventional Cardiology

## 2013-10-08 ENCOUNTER — Other Ambulatory Visit (HOSPITAL_COMMUNITY): Payer: Self-pay | Admitting: Internal Medicine

## 2014-01-04 ENCOUNTER — Other Ambulatory Visit: Payer: Self-pay | Admitting: Interventional Cardiology

## 2014-01-30 ENCOUNTER — Other Ambulatory Visit: Payer: Self-pay | Admitting: Internal Medicine

## 2014-02-02 ENCOUNTER — Other Ambulatory Visit: Payer: Self-pay | Admitting: Internal Medicine

## 2014-02-04 ENCOUNTER — Other Ambulatory Visit: Payer: Self-pay | Admitting: Internal Medicine

## 2014-02-10 ENCOUNTER — Other Ambulatory Visit: Payer: Self-pay | Admitting: Internal Medicine

## 2014-02-13 ENCOUNTER — Other Ambulatory Visit: Payer: Self-pay | Admitting: Internal Medicine

## 2014-02-21 ENCOUNTER — Encounter (HOSPITAL_COMMUNITY): Payer: Self-pay | Admitting: Internal Medicine

## 2014-03-25 ENCOUNTER — Other Ambulatory Visit: Payer: Self-pay | Admitting: *Deleted

## 2014-03-25 MED ORDER — FLECAINIDE ACETATE 100 MG PO TABS: 50.0000 mg | ORAL_TABLET | Freq: Two times a day (BID) | ORAL | Status: DC

## 2014-04-05 ENCOUNTER — Other Ambulatory Visit: Payer: Self-pay | Admitting: Interventional Cardiology

## 2014-04-05 ENCOUNTER — Other Ambulatory Visit: Payer: Self-pay | Admitting: Internal Medicine

## 2014-04-11 ENCOUNTER — Encounter: Payer: Self-pay | Admitting: Internal Medicine

## 2014-04-11 ENCOUNTER — Ambulatory Visit (INDEPENDENT_AMBULATORY_CARE_PROVIDER_SITE_OTHER): Payer: BLUE CROSS/BLUE SHIELD | Admitting: Internal Medicine

## 2014-04-11 VITALS — BP 126/78 | HR 73 | Ht 68.0 in | Wt 192.0 lb

## 2014-04-11 DIAGNOSIS — E7800 Pure hypercholesterolemia, unspecified: Secondary | ICD-10-CM

## 2014-04-11 DIAGNOSIS — Z720 Tobacco use: Secondary | ICD-10-CM

## 2014-04-11 DIAGNOSIS — I10 Essential (primary) hypertension: Secondary | ICD-10-CM

## 2014-04-11 DIAGNOSIS — F17200 Nicotine dependence, unspecified, uncomplicated: Secondary | ICD-10-CM

## 2014-04-11 DIAGNOSIS — I480 Paroxysmal atrial fibrillation: Secondary | ICD-10-CM

## 2014-04-11 NOTE — Patient Instructions (Signed)
Your physician wants you to follow-up in: 6 months with Roderic Palau, NP and 12 months with Dr Vallery Ridge will receive a reminder letter in the mail two months in advance. If you don't receive a letter, please call our office to schedule the follow-up appointment.  Your physician recommends that you return for lab work on: 04/16/14

## 2014-04-11 NOTE — Progress Notes (Signed)
Electrophysiology Office Note   Date:  04/11/2014   ID:  Kellie Reynolds, DOB 21-Sep-1955, MRN [id number]  PCP:  Donnie Coffin, MD  Cardiologist:  Dr Beau Fanny Primary Electrophysiologist:  Thompson Grayer, MD    Chief Complaint  Patient presents with  . Follow-up    AFIB     History of Present Illness: Kellie Reynolds is a 59 y.o. female who presents today for electrophysiology evaluation.   She presents today for EP follow-up.  She underwent afib ablation by me in 2014.  She has done well with only occasional palpitations since that time.  She did have sustained symptomatic afib with tachypalpitations and presyncope the weekend after Christmas.  This episode lasted 4 hours.   She took metoprolol and found that her symptoms improved as her heart rate came down.  She has increased her flecainide to 100mg  BID and has had no further afib. Today, she denies symptoms of  chest pain, shortness of breath, orthopnea, PND, lower extremity edema, claudication,  bleeding, or neurologic sequela. The patient is tolerating medications without difficulties and is otherwise without complaint today.    Past Medical History  Diagnosis Date  . GERD (gastroesophageal reflux disease)   . HTN (hypertension)   . Hypercholesteremia   . MVP (mitral valve prolapse)   . Paroxysmal atrial fibrillation   . Skin cancer   . Fibrocystic breast disease    Past Surgical History  Procedure Laterality Date  . Tubal ligation    . Menisectomy      Right knee  . Nasal sinus surgery  1990  . Nasal polyp surgery  1990  . Tee without cardioversion  04/17/2012    Procedure: TRANSESOPHAGEAL ECHOCARDIOGRAM (TEE);  Surgeon: Jettie Booze, MD;  Location: [place];  Service: Cardiovascular;  Laterality: N/A;  pre ablation  . Atrial fibrillation ablation  04/18/12    PVI by Keili Hasten  . Cardiac electrophysiology study and ablation    . Atrial fibrillation ablation N/A 04/18/2012    Procedure: ATRIAL FIBRILLATION  ABLATION;  Surgeon: Thompson Grayer, MD;  Location: [facility] CATH LAB;  Service: Cardiovascular;  Laterality: N/A;     Current Outpatient Prescriptions  Medication Sig Dispense Refill  . atorvastatin (LIPITOR) 10 MG tablet TAKE 1 TABLET BY MOUTH IN THE EVENING 30 tablet 2  . BESIVANCE 0.6 % SUSP Place 1 drop into the right eye 3 (three) times daily.  1  . cyanocobalamin 1000 MCG tablet Take 1,000 mcg by mouth every evening.     . DUREZOL 0.05 % EMUL Place 1 drop into the right eye 3 (three) times daily.  1  . flecainide (TAMBOCOR) 100 MG tablet Take 100 mg by mouth 2 (two) times daily.    Marland Kitchen lisinopril (PRINIVIL,ZESTRIL) 20 MG tablet TAKE 1 TABLET (20 MG TOTAL) BY MOUTH DAILY. 30 tablet 0  . metoprolol (LOPRESSOR) 50 MG tablet TAKE 1/2 TABLET BY MOUTH 2 TIMES A DAY 30 tablet 0  . Omega-3 Fatty Acids (OMEGA-3 FISH OIL PO) Take 2,400 mg by mouth daily.    Marland Kitchen omeprazole (PRILOSEC) 20 MG capsule Take 20 mg by mouth every morning.     Marland Kitchen PROLENSA 0.07 % SOLN Place 1 drop into the right eye at bedtime.  1  . sodium chloride (OCEAN) 0.65 % nasal spray Place 1 spray into the nose 2 (two) times daily as needed. For dryness    . XARELTO 20 MG TABS tablet TAKE 1 TABLET BY MOUTH EVERY DAY 30 tablet 1  No current facility-administered medications for this visit.    Allergies:   Review of patient's allergies indicates no known allergies.   Social History:  The patient  reports that she has been smoking.  She has never used smokeless tobacco. She reports that she drinks alcohol. She reports that she does not use illicit drugs.   Family History:  The patient's family history includes CAD in her father; Diabetes in her mother; Hyperlipidemia in her father; Hypertension in her father; Liver cancer in her maternal grandmother.    ROS:  Please see the history of present illness.    All other systems are reviewed and negative.    PHYSICAL EXAM: VS:  BP 126/78 mmHg  Pulse 73  Ht 5\' 8"  (1.727 m)  Wt 192 lb (87.091  kg)  BMI 29.20 kg/m2 , BMI Body mass index is 29.2 kg/(m^2). GEN: Well nourished, well developed, in no acute distress HEENT: normal Neck: no JVD, carotid bruits, or masses Cardiac: RRR; no murmurs, rubs, or gallops,no edema  Respiratory:  clear to auscultation bilaterally, normal work of breathing GI: soft, nontender, nondistended, + BS MS: no deformity or atrophy Skin: warm and dry  Neuro:  Strength and sensation are intact Psych: euthymic mood, full affect  EKG:  EKG is ordered today. The ekg ordered today shows sinus rhythm 73 bpm, otherwise normal ekg    Recent Labs: 07/15/2013: BUN 7; Creatinine 0.80; Hemoglobin 12.9; Potassium 3.9; Sodium 144    Lipid Panel  No results found for: CHOL, TRIG, HDL, CHOLHDL, VLDL, LDLCALC, LDLDIRECT   Wt Readings from Last 3 Encounters:  04/11/14 192 lb (87.091 kg)  10/03/13 192 lb 12.8 oz (87.454 kg)  07/15/13 185 lb (83.915 kg)      ASSESSMENT AND PLAN:  1.  Paroxysmal atrial fibrillation Once episode since last visit Continue flecainide and metoprolol chads2vasc score is at least 2.  Continue xarelto. Check bmet and cbc Consider reducing flecainide to 50mg  BID upon return  2. HTN Stable No change required today Will check bmet, lipids, lfts  3. Tobacco Cessation advised  She is not ready to quit  Return in 6 months to see Roderic Palau NP I will see in a year    Current medicines are reviewed at length with the patient today.   The patient does not have concerns regarding her medicines.  The following changes were made today:  none   Signed, Thompson Grayer, MD  04/11/2014 4:22 PM     [address] [address] [phone] (office) [phone] (fax)

## 2014-04-12 ENCOUNTER — Other Ambulatory Visit: Payer: Self-pay

## 2014-04-12 MED ORDER — FLECAINIDE ACETATE 100 MG PO TABS: 100.0000 mg | ORAL_TABLET | Freq: Two times a day (BID) | ORAL | Status: DC

## 2014-04-16 ENCOUNTER — Other Ambulatory Visit: Payer: Self-pay | Admitting: Internal Medicine

## 2014-04-16 ENCOUNTER — Other Ambulatory Visit (INDEPENDENT_AMBULATORY_CARE_PROVIDER_SITE_OTHER): Payer: BLUE CROSS/BLUE SHIELD | Admitting: *Deleted

## 2014-04-16 DIAGNOSIS — I480 Paroxysmal atrial fibrillation: Secondary | ICD-10-CM

## 2014-04-16 DIAGNOSIS — F17200 Nicotine dependence, unspecified, uncomplicated: Secondary | ICD-10-CM

## 2014-04-16 DIAGNOSIS — I10 Essential (primary) hypertension: Secondary | ICD-10-CM

## 2014-04-16 DIAGNOSIS — E7800 Pure hypercholesterolemia, unspecified: Secondary | ICD-10-CM

## 2014-04-16 DIAGNOSIS — Z720 Tobacco use: Secondary | ICD-10-CM

## 2014-04-16 LAB — CBC WITH DIFFERENTIAL/PLATELET
Basophils Absolute: 0 10*3/uL (ref 0.0–0.1)
Basophils Relative: 0.3 % (ref 0.0–3.0)
EOS ABS: 0.7 10*3/uL (ref 0.0–0.7)
EOS PCT: 8.8 % — AB (ref 0.0–5.0)
HEMATOCRIT: 38.9 % (ref 36.0–46.0)
Hemoglobin: 13.2 g/dL (ref 12.0–15.0)
LYMPHS PCT: 22.7 % (ref 12.0–46.0)
Lymphs Abs: 1.8 10*3/uL (ref 0.7–4.0)
MCHC: 34 g/dL (ref 30.0–36.0)
MCV: 88.3 fl (ref 78.0–100.0)
MONO ABS: 0.6 10*3/uL (ref 0.1–1.0)
Monocytes Relative: 7.2 % (ref 3.0–12.0)
Neutro Abs: 4.9 10*3/uL (ref 1.4–7.7)
Neutrophils Relative %: 61 % (ref 43.0–77.0)
PLATELETS: 149 10*3/uL — AB (ref 150.0–400.0)
RBC: 4.4 Mil/uL (ref 3.87–5.11)
RDW: 13.6 % (ref 11.5–15.5)
WBC: 8 10*3/uL (ref 4.0–10.5)

## 2014-04-16 LAB — HEPATIC FUNCTION PANEL
ALBUMIN: 3.6 g/dL (ref 3.5–5.2)
ALK PHOS: 65 U/L (ref 39–117)
ALT: 16 U/L (ref 0–35)
AST: 18 U/L (ref 0–37)
BILIRUBIN DIRECT: 0 mg/dL (ref 0.0–0.3)
Total Bilirubin: 0.3 mg/dL (ref 0.2–1.2)
Total Protein: 6.6 g/dL (ref 6.0–8.3)

## 2014-04-16 LAB — LIPID PANEL
Cholesterol: 184 mg/dL (ref 0–200)
HDL: 46.7 mg/dL (ref >39.00)
LDL Cholesterol: 117 mg/dL — ABNORMAL HIGH (ref 0–99)
NONHDL: 137.3
TRIGLYCERIDES: 102 mg/dL (ref 0.0–149.0)
Total CHOL/HDL Ratio: 4
VLDL: 20.4 mg/dL (ref 0.0–40.0)

## 2014-04-16 LAB — BASIC METABOLIC PANEL
BUN: 7 mg/dL (ref 6–23)
CO2: 30 meq/L (ref 19–32)
Calcium: 9.1 mg/dL (ref 8.4–10.5)
Chloride: 108 mEq/L (ref 96–112)
Creatinine, Ser: 0.64 mg/dL (ref 0.40–1.20)
GFR: 101.36 mL/min (ref >60.00)
Glucose, Bld: 119 mg/dL — ABNORMAL HIGH (ref 70–99)
Potassium: 3.9 mEq/L (ref 3.5–5.1)
SODIUM: 141 meq/L (ref 135–145)

## 2014-04-16 NOTE — Addendum Note (Signed)
Addended by: Eulis Foster on: 04/16/2014 09:49 AM   Modules accepted: Orders

## 2014-05-04 ENCOUNTER — Other Ambulatory Visit: Payer: Self-pay | Admitting: Internal Medicine

## 2014-05-05 ENCOUNTER — Other Ambulatory Visit: Payer: Self-pay | Admitting: Internal Medicine

## 2014-06-04 ENCOUNTER — Other Ambulatory Visit: Payer: Self-pay | Admitting: Internal Medicine

## 2014-06-11 ENCOUNTER — Other Ambulatory Visit: Payer: Self-pay | Admitting: Internal Medicine

## 2014-06-24 ENCOUNTER — Telehealth: Payer: Self-pay | Admitting: *Deleted

## 2014-06-24 NOTE — Telephone Encounter (Signed)
S/w Alex @ [phone] to get number to call for Patient's Prior Auth on Xarelto ( 20 mg ) daily and stated was not needed.

## 2014-07-02 ENCOUNTER — Other Ambulatory Visit: Payer: Self-pay | Admitting: Interventional Cardiology

## 2014-07-02 ENCOUNTER — Other Ambulatory Visit: Payer: Self-pay | Admitting: Internal Medicine

## 2014-07-11 ENCOUNTER — Other Ambulatory Visit: Payer: Self-pay | Admitting: Interventional Cardiology

## 2014-07-19 ENCOUNTER — Telehealth: Payer: Self-pay

## 2014-07-19 NOTE — Telephone Encounter (Signed)
Prior auth received for Xarelto 20mg  good for 1 year.

## 2014-07-19 NOTE — Telephone Encounter (Signed)
Received approval for patient to get Xarelto 20mg  good for one year.

## 2014-09-13 ENCOUNTER — Other Ambulatory Visit: Payer: Self-pay | Admitting: Internal Medicine

## 2014-09-24 ENCOUNTER — Telehealth: Payer: Self-pay | Admitting: Internal Medicine

## 2014-09-30 ENCOUNTER — Other Ambulatory Visit: Payer: Self-pay | Admitting: Interventional Cardiology

## 2014-09-30 ENCOUNTER — Other Ambulatory Visit: Payer: Self-pay | Admitting: Internal Medicine

## 2014-10-07 ENCOUNTER — Encounter (HOSPITAL_COMMUNITY): Payer: Self-pay | Admitting: Nurse Practitioner

## 2014-10-07 ENCOUNTER — Ambulatory Visit (HOSPITAL_COMMUNITY)
Admission: RE | Admit: 2014-10-07 | Discharge: 2014-10-07 | Disposition: A | Discharge status: Home / Self Care | Payer: BLUE CROSS/BLUE SHIELD | Source: Ambulatory Visit | Attending: Nurse Practitioner | Admitting: Nurse Practitioner

## 2014-10-07 VITALS — BP 150/78 | HR 70 | Ht 68.0 in | Wt 191.8 lb

## 2014-10-07 DIAGNOSIS — I480 Paroxysmal atrial fibrillation: Secondary | ICD-10-CM

## 2014-10-07 DIAGNOSIS — I10 Essential (primary) hypertension: Secondary | ICD-10-CM | POA: Insufficient documentation

## 2014-10-07 LAB — BASIC METABOLIC PANEL
ANION GAP: 6 (ref 5–15)
BUN: 10 mg/dL (ref 6–20)
CO2: 29 mmol/L (ref 22–32)
CREATININE: 0.73 mg/dL (ref 0.44–1.00)
Calcium: 9.6 mg/dL (ref 8.9–10.3)
Chloride: 103 mmol/L (ref 101–111)
Glucose, Bld: 126 mg/dL — ABNORMAL HIGH (ref 65–99)
Potassium: 4.5 mmol/L (ref 3.5–5.1)
Sodium: 138 mmol/L (ref 135–145)

## 2014-10-07 LAB — CBC
HEMATOCRIT: 39.2 % (ref 36.0–46.0)
HEMOGLOBIN: 13.5 g/dL (ref 12.0–15.0)
MCH: 31.7 pg (ref 26.0–34.0)
MCHC: 34.4 g/dL (ref 30.0–36.0)
MCV: 92 fL (ref 78.0–100.0)
Platelets: 144 10*3/uL — ABNORMAL LOW (ref 150–400)
RBC: 4.26 MIL/uL (ref 3.87–5.11)
RDW: 13.3 % (ref 11.5–15.5)
WBC: 6.7 10*3/uL (ref 4.0–10.5)

## 2014-10-07 NOTE — Progress Notes (Signed)
Patient ID: Kellie Reynolds, female   DOB: [birth year], 59 y.o.   MRN: [id number]     Primary Care Physician: Donnie Coffin, MD Referring Physician:    ROSEMOND LYTTLE is a 59 y.o. female with a h/o PAF on flecainide for f/u in the afib clinic. She reports that her afib burden is low and she is happy with the current afib management. She continues on blood thinner without bleeding issues. Blood pressure rechecked at 146/80. H/o afib ablation 04/18/12.  Today, she denies symptoms of palpitations, chest pain, shortness of breath, orthopnea, PND, lower extremity edema, dizziness, presyncope, syncope, or neurologic sequela. The patient is tolerating medications without difficulties and is otherwise without complaint today.   Past Medical History  Diagnosis Date  . GERD (gastroesophageal reflux disease)   . HTN (hypertension)   . Hypercholesteremia   . MVP (mitral valve prolapse)   . Paroxysmal atrial fibrillation   . Skin cancer   . Fibrocystic breast disease    Past Surgical History  Procedure Laterality Date  . Tubal ligation    . Menisectomy      Right knee  . Nasal sinus surgery  1990  . Nasal polyp surgery  1990  . Tee without cardioversion  04/17/2012    Procedure: TRANSESOPHAGEAL ECHOCARDIOGRAM (TEE);  Surgeon: Jettie Booze, MD;  Location: [place];  Service: Cardiovascular;  Laterality: N/A;  pre ablation  . Atrial fibrillation ablation  04/18/12    PVI by Allred  . Cardiac electrophysiology study and ablation    . Atrial fibrillation ablation N/A 04/18/2012    Procedure: ATRIAL FIBRILLATION ABLATION;  Surgeon: Thompson Grayer, MD;  Location: [facility] CATH LAB;  Service: Cardiovascular;  Laterality: N/A;    Current Outpatient Prescriptions  Medication Sig Dispense Refill  . atorvastatin (LIPITOR) 10 MG tablet TAKE 1 TABLET BY MOUTH IN THE EVENING 30 tablet 0  . flecainide (TAMBOCOR) 100 MG tablet Take 1 tablet (100 mg total) by mouth 2 (two) times daily. (Patient taking  differently: Take 100 mg by mouth 2 (two) times daily. Takes 100mg  in AM and 50mg  in bedtime) 180 tablet 1  . glucosamine-chondroitin 500-400 MG tablet Take 1 tablet by mouth daily.    Marland Kitchen ibuprofen (ADVIL) 200 MG tablet Take 200 mg by mouth every 6 (six) hours as needed.    Marland Kitchen lisinopril (PRINIVIL,ZESTRIL) 20 MG tablet TAKE 1 TABLET (20 MG TOTAL) BY MOUTH DAILY. 30 tablet 5  . metoprolol (LOPRESSOR) 50 MG tablet TAKE 1/2 TABLET BY MOUTH 2 TIMES A DAY 30 tablet 6  . Omega-3 Fatty Acids (OMEGA-3 FISH OIL PO) Take 2,400 mg by mouth daily.    Marland Kitchen omeprazole (PRILOSEC) 20 MG capsule Take 20 mg by mouth every morning.     . sodium chloride (OCEAN) 0.65 % nasal spray Place 1 spray into the nose 2 (two) times daily as needed. For dryness    . XARELTO 20 MG TABS tablet TAKE 1 TABLET BY MOUTH EVERY DAY 30 tablet 1   No current facility-administered medications for this encounter.    No Known Allergies  History   Social History  . Marital Status: Divorced    Spouse Name: N/A  . Number of Children: N/A  . Years of Education: N/A   Occupational History  . Not on file.   Social History Main Topics  . Smoking status: Current Every Day Smoker -- 1.00 packs/day  . Smokeless tobacco: Never Used  . Alcohol Use: Yes     Comment: 12  beers weekly  . Drug Use: No  . Sexual Activity: Not on file   Other Topics Concern  . Not on file   Social History Narrative    Family History  Problem Relation Age of Onset  . Diabetes Mother   . Hyperlipidemia Father   . Hypertension Father   . CAD Father   . Liver cancer Maternal Grandmother     ROS- All systems are reviewed and negative except as per the HPI above  Physical Exam: Filed Vitals:   10/07/14 1536  BP: 150/78  Pulse: 70  Height: 5\' 8"  (1.727 m)  Weight: 191 lb 12.8 oz (87 kg)    GEN- The patient is well appearing, alert and oriented x 3 today.   Head- normocephalic, atraumatic Eyes-  Sclera clear, conjunctiva pink Ears- hearing  intact Oropharynx- clear Neck- supple, no JVP Lymph- no cervical lymphadenopathy Lungs- Clear to ausculation bilaterally, normal work of breathing Heart- Regular rate and rhythm, no murmurs, rubs or gallops, PMI not laterally displaced GI- soft, NT, ND, + BS Extremities- no clubbing, cyanosis, or edema MS- no significant deformity or atrophy Skin- no rash or lesion Psych- euthymic mood, full affect Neuro- strength and sensation are intact  EKG-NSR, normal EKG.  Assessment and Plan: 1. PAF Maintaining SR on flecainide. Bmet/cbc today  2. HTN Mildly elevated Avoid salt  F/u Dr. Rayann Heman as he specified in 6 months

## 2014-10-28 ENCOUNTER — Other Ambulatory Visit: Payer: Self-pay | Admitting: Interventional Cardiology

## 2014-11-12 ENCOUNTER — Other Ambulatory Visit: Payer: Self-pay | Admitting: Internal Medicine

## 2014-11-29 ENCOUNTER — Other Ambulatory Visit: Payer: Self-pay | Admitting: Internal Medicine

## 2015-02-25 ENCOUNTER — Other Ambulatory Visit: Payer: Self-pay | Admitting: Internal Medicine

## 2015-03-25 ENCOUNTER — Other Ambulatory Visit: Payer: Self-pay | Admitting: Internal Medicine

## 2015-04-14 ENCOUNTER — Ambulatory Visit (INDEPENDENT_AMBULATORY_CARE_PROVIDER_SITE_OTHER): Payer: BLUE CROSS/BLUE SHIELD | Admitting: Internal Medicine

## 2015-04-14 ENCOUNTER — Encounter: Payer: Self-pay | Admitting: Internal Medicine

## 2015-04-14 ENCOUNTER — Encounter (INDEPENDENT_AMBULATORY_CARE_PROVIDER_SITE_OTHER): Payer: Self-pay

## 2015-04-14 VITALS — BP 134/82 | HR 60 | Ht 68.0 in | Wt 181.4 lb

## 2015-04-14 DIAGNOSIS — F17200 Nicotine dependence, unspecified, uncomplicated: Secondary | ICD-10-CM

## 2015-04-14 DIAGNOSIS — I10 Essential (primary) hypertension: Secondary | ICD-10-CM | POA: Diagnosis not present

## 2015-04-14 DIAGNOSIS — I480 Paroxysmal atrial fibrillation: Secondary | ICD-10-CM | POA: Diagnosis not present

## 2015-04-14 MED ORDER — FLECAINIDE ACETATE 100 MG PO TABS: 50.0000 mg | ORAL_TABLET | Freq: Two times a day (BID) | ORAL | Status: DC

## 2015-04-14 NOTE — Progress Notes (Signed)
Electrophysiology Office Note   Date:  04/14/2015   ID:  [name], DOB 06/21/1955, MRN [id number]  PCP:  Donnie Coffin, MD  Cardiologist:  Dr Beau Fanny Primary Electrophysiologist:  Thompson Grayer, MD    Chief Complaint  Patient presents with  . Atrial Fibrillation     History of Present Illness: Kellie Reynolds is a 60 y.o. female who presents today for electrophysiology evaluation.   She presents today for EP follow-up.  She is doing well, without difficulty with afib.  Unfortunately, she continues to smoke and is reluctant to quit.  Today, she denies symptoms of  chest pain, shortness of breath, orthopnea, PND, lower extremity edema, claudication,  bleeding, or neurologic sequela. The patient is tolerating medications without difficulties and is otherwise without complaint today.    Past Medical History  Diagnosis Date  . GERD (gastroesophageal reflux disease)   . HTN (hypertension)   . Hypercholesteremia   . MVP (mitral valve prolapse)   . Paroxysmal atrial fibrillation (HCC)   . Skin cancer   . Fibrocystic breast disease    Past Surgical History  Procedure Laterality Date  . Tubal ligation    . Menisectomy      Right knee  . Nasal sinus surgery  1990  . Nasal polyp surgery  1990  . Tee without cardioversion  04/17/2012    Procedure: TRANSESOPHAGEAL ECHOCARDIOGRAM (TEE);  Surgeon: Jettie Booze, MD;  Location: [place];  Service: Cardiovascular;  Laterality: N/A;  pre ablation  . Atrial fibrillation ablation  04/18/12    PVI by Lillia Lengel  . Cardiac electrophysiology study and ablation    . Atrial fibrillation ablation N/A 04/18/2012    Procedure: ATRIAL FIBRILLATION ABLATION;  Surgeon: Thompson Grayer, MD;  Location: [facility] CATH LAB;  Service: Cardiovascular;  Laterality: N/A;     Current Outpatient Prescriptions  Medication Sig Dispense Refill  . atorvastatin (LIPITOR) 10 MG tablet TAKE 1 TABLET BY MOUTH IN THE EVENING 30 tablet 3  . flecainide (TAMBOCOR) 100  MG tablet Take 1 tablet in the morning and 1/2 tablet at night    . glucosamine-chondroitin 500-400 MG tablet Take 1 tablet by mouth daily.    Marland Kitchen ibuprofen (ADVIL) 200 MG tablet Take 200 mg by mouth every 6 (six) hours as needed.    Marland Kitchen lisinopril (PRINIVIL,ZESTRIL) 20 MG tablet TAKE 1 TABLET (20 MG TOTAL) BY MOUTH DAILY. 30 tablet 7  . metoprolol (LOPRESSOR) 50 MG tablet TAKE 1/2 TABLET BY MOUTH 2 TIMES A DAY 30 tablet 4  . Omega-3 Fatty Acids (OMEGA-3 FISH OIL PO) Take 2,400 mg by mouth daily.    Marland Kitchen omeprazole (PRILOSEC) 20 MG capsule Take 20 mg by mouth every morning.     . sodium chloride (OCEAN) 0.65 % nasal spray Place 1 spray into the nose 2 (two) times daily as needed. For dryness    . XARELTO 20 MG TABS tablet TAKE 1 TABLET BY MOUTH EVERY DAY 30 tablet 3   No current facility-administered medications for this visit.    Allergies:   Review of patient's allergies indicates no known allergies.   Social History:  The patient  reports that she has been smoking.  She has never used smokeless tobacco. She reports that she drinks alcohol. She reports that she does not use illicit drugs.   Family History:  The patient's family history includes CAD in her father; Diabetes in her mother; Heart attack in her father; Hyperlipidemia in her father; Hypertension in her father; Liver  cancer in her maternal grandmother.    ROS:  Please see the history of present illness.    All other systems are reviewed and negative.    PHYSICAL EXAM: VS:  BP 134/82 mmHg  Pulse 60  Ht 5\' 8"  (1.727 m)  Wt 181 lb 6.4 oz (82.283 kg)  BMI 27.59 kg/m2 , BMI Body mass index is 27.59 kg/(m^2). GEN: Well nourished, well developed, in no acute distress HEENT: normal Neck: no JVD, carotid bruits, or masses Cardiac: RRR; no murmurs, rubs, or gallops,no edema  Respiratory:  clear to auscultation bilaterally, normal work of breathing GI: soft, nontender, nondistended, + BS MS: no deformity or atrophy Skin: warm and dry    Neuro:  Strength and sensation are intact Psych: euthymic mood, full affect  EKG:  EKG is ordered today. The ekg ordered today shows sinus rhythm 60 bpm, PR 202 msec, Qtc 424 msec, otherwise normal ekg    Recent Labs: 04/16/2014: ALT 16 10/07/2014: BUN 10; Creatinine, Ser 0.73; Hemoglobin 13.5; Platelets 144*; Potassium 4.5; Sodium 138    Lipid Panel     Component Value Date/Time   CHOL 184 04/16/2014 0950   TRIG 102.0 04/16/2014 0950   HDL 46.70 04/16/2014 0950   CHOLHDL 4 04/16/2014 0950   VLDL 20.4 04/16/2014 0950   LDLCALC 117* 04/16/2014 0950     Wt Readings from Last 3 Encounters:  04/14/15 181 lb 6.4 oz (82.283 kg)  10/07/14 191 lb 12.8 oz (87 kg)  04/11/14 192 lb (87.091 kg)      ASSESSMENT AND PLAN:  1.  Paroxysmal atrial fibrillation Well controlled She is afraid to stop flecainide Will reduce flecainide to 50mg  BID at this time Continue metoprolol chads2vasc score is at least 2.  Continue xarelto. Wishes to have CrCl followed by PCP  2. HTN Stable No change required today Wishes to have PCP follow bmet  3. Tobacco Cessation advised  She is not ready to quit  Return in 1 year She will contact my office should problems arise in the interim   Current medicines are reviewed at length with the patient today.   The patient does not have concerns regarding her medicines.  The following changes were made today:  none   Signed, Thompson Grayer, MD  04/14/2015 2:23 PM     [address] [place] [address] [phone] (office) [phone] (fax)

## 2015-04-14 NOTE — Patient Instructions (Signed)
Medication Instructions:  Your physician has recommended you make the following change in your medication:  1) Decrease Flecainide to 1/2 tablet twice daily    Labwork: None ordered   Testing/Procedures: None ordered   Follow-Up: Your physician wants you to follow-up in: 12 months with Dr Vallery Ridge will receive a reminder letter in the mail two months in advance. If you don't receive a letter, please call our office to schedule the follow-up appointment.   Any Other Special Instructions Will Be Listed Below (If Applicable).     If you need a refill on your cardiac medications before your next appointment, please call your pharmacy.

## 2015-04-20 ENCOUNTER — Other Ambulatory Visit: Payer: Self-pay | Admitting: Internal Medicine

## 2015-06-25 ENCOUNTER — Encounter: Payer: Self-pay | Admitting: Nurse Practitioner

## 2015-07-05 ENCOUNTER — Other Ambulatory Visit: Payer: Self-pay | Admitting: Internal Medicine

## 2015-07-07 ENCOUNTER — Telehealth: Payer: Self-pay

## 2015-07-07 NOTE — Telephone Encounter (Signed)
I would call patient and make sure she decreased her dose at last OV to 50mg  bid

## 2015-07-07 NOTE — Telephone Encounter (Signed)
Prior auth obtained for Xarelto 20mg  [facility]. Case ID # [id number].

## 2015-07-10 ENCOUNTER — Other Ambulatory Visit: Payer: Self-pay | Admitting: *Deleted

## 2015-07-10 MED ORDER — FLECAINIDE ACETATE 100 MG PO TABS: 50.0000 mg | ORAL_TABLET | Freq: Two times a day (BID) | ORAL | Status: DC

## 2015-07-21 ENCOUNTER — Other Ambulatory Visit: Payer: Self-pay | Admitting: Internal Medicine

## 2015-07-22 DIAGNOSIS — Z1159 Encounter for screening for other viral diseases: Secondary | ICD-10-CM | POA: Diagnosis not present

## 2015-07-22 DIAGNOSIS — Z0000 Encounter for general adult medical examination without abnormal findings: Secondary | ICD-10-CM | POA: Diagnosis not present

## 2015-07-22 DIAGNOSIS — E7800 Pure hypercholesterolemia, unspecified: Secondary | ICD-10-CM | POA: Diagnosis not present

## 2015-07-28 ENCOUNTER — Encounter: Payer: Self-pay | Admitting: Internal Medicine

## 2015-08-20 ENCOUNTER — Other Ambulatory Visit: Payer: Self-pay | Admitting: Internal Medicine

## 2015-08-25 DIAGNOSIS — M7062 Trochanteric bursitis, left hip: Secondary | ICD-10-CM | POA: Diagnosis not present

## 2015-08-25 DIAGNOSIS — M1711 Unilateral primary osteoarthritis, right knee: Secondary | ICD-10-CM | POA: Diagnosis not present

## 2015-08-25 DIAGNOSIS — M25561 Pain in right knee: Secondary | ICD-10-CM | POA: Diagnosis not present

## 2015-08-25 DIAGNOSIS — M25552 Pain in left hip: Secondary | ICD-10-CM | POA: Diagnosis not present

## 2015-08-26 ENCOUNTER — Other Ambulatory Visit: Payer: Self-pay | Admitting: Internal Medicine

## 2015-08-27 ENCOUNTER — Telehealth: Payer: Self-pay | Admitting: *Deleted

## 2015-08-27 NOTE — Telephone Encounter (Signed)
At patients 04/14/15 office visit, she was instructed to Decrease Flecainide to 1/2 tablet twice daily. Her snapshot has two different rx's listed, neither of which reached the pharmacy. She stated that sometimes she still takes one whole tablet in the AM and one-half tablet in the PM. Ok to reorder with this sig? She needs this refilled for a thirty day supply to [facility]. Please advise. Thanks, MI

## 2015-08-30 NOTE — Telephone Encounter (Signed)
Should take as directed

## 2015-09-01 MED ORDER — FLECAINIDE ACETATE 100 MG PO TABS: 50.0000 mg | ORAL_TABLET | Freq: Two times a day (BID) | ORAL | Status: DC

## 2015-09-01 NOTE — Addendum Note (Signed)
Addended by: Janan Halter F on: 09/01/2015 02:50 PM   Modules accepted: Orders, Medications

## 2015-10-10 ENCOUNTER — Other Ambulatory Visit: Payer: Self-pay | Admitting: Gastroenterology

## 2015-11-18 ENCOUNTER — Encounter (HOSPITAL_COMMUNITY): Payer: Self-pay | Admitting: *Deleted

## 2015-11-21 NOTE — Telephone Encounter (Signed)
Please close Encounter °

## 2015-11-24 ENCOUNTER — Encounter (HOSPITAL_COMMUNITY): Payer: Self-pay | Admitting: Anesthesiology

## 2015-11-24 ENCOUNTER — Ambulatory Visit (HOSPITAL_COMMUNITY): Payer: BLUE CROSS/BLUE SHIELD | Admitting: Anesthesiology

## 2015-11-24 ENCOUNTER — Encounter (HOSPITAL_COMMUNITY)
Admission: RE | Disposition: A | Discharge status: Home / Self Care | Payer: Self-pay | Source: Ambulatory Visit | Attending: Gastroenterology

## 2015-11-24 ENCOUNTER — Ambulatory Visit (HOSPITAL_COMMUNITY)
Admission: RE | Admit: 2015-11-24 | Discharge: 2015-11-24 | Disposition: A | Discharge status: Home / Self Care | Payer: BLUE CROSS/BLUE SHIELD | Source: Ambulatory Visit | Attending: Gastroenterology | Admitting: Gastroenterology

## 2015-11-24 DIAGNOSIS — K635 Polyp of colon: Secondary | ICD-10-CM | POA: Insufficient documentation

## 2015-11-24 DIAGNOSIS — I4891 Unspecified atrial fibrillation: Secondary | ICD-10-CM | POA: Insufficient documentation

## 2015-11-24 DIAGNOSIS — I480 Paroxysmal atrial fibrillation: Secondary | ICD-10-CM | POA: Diagnosis not present

## 2015-11-24 DIAGNOSIS — Z86010 Personal history of colonic polyps: Secondary | ICD-10-CM | POA: Insufficient documentation

## 2015-11-24 DIAGNOSIS — G4733 Obstructive sleep apnea (adult) (pediatric): Secondary | ICD-10-CM | POA: Insufficient documentation

## 2015-11-24 DIAGNOSIS — Z7901 Long term (current) use of anticoagulants: Secondary | ICD-10-CM | POA: Diagnosis not present

## 2015-11-24 DIAGNOSIS — E7800 Pure hypercholesterolemia, unspecified: Secondary | ICD-10-CM | POA: Diagnosis not present

## 2015-11-24 DIAGNOSIS — Z8719 Personal history of other diseases of the digestive system: Secondary | ICD-10-CM | POA: Insufficient documentation

## 2015-11-24 DIAGNOSIS — K219 Gastro-esophageal reflux disease without esophagitis: Secondary | ICD-10-CM | POA: Diagnosis not present

## 2015-11-24 DIAGNOSIS — I10 Essential (primary) hypertension: Secondary | ICD-10-CM | POA: Diagnosis not present

## 2015-11-24 DIAGNOSIS — Z1211 Encounter for screening for malignant neoplasm of colon: Secondary | ICD-10-CM | POA: Insufficient documentation

## 2015-11-24 DIAGNOSIS — Z79899 Other long term (current) drug therapy: Secondary | ICD-10-CM | POA: Insufficient documentation

## 2015-11-24 DIAGNOSIS — F17200 Nicotine dependence, unspecified, uncomplicated: Secondary | ICD-10-CM | POA: Insufficient documentation

## 2015-11-24 DIAGNOSIS — D122 Benign neoplasm of ascending colon: Secondary | ICD-10-CM | POA: Diagnosis not present

## 2015-11-24 DIAGNOSIS — K5730 Diverticulosis of large intestine without perforation or abscess without bleeding: Secondary | ICD-10-CM | POA: Diagnosis not present

## 2015-11-24 HISTORY — PX: COLONOSCOPY WITH PROPOFOL: SHX5780

## 2015-11-24 HISTORY — DX: Sleep apnea, unspecified: G47.30

## 2015-11-24 HISTORY — DX: Unspecified osteoarthritis, unspecified site: M19.90

## 2015-11-24 HISTORY — DX: Family history of other specified conditions: Z84.89

## 2015-11-24 SURGERY — COLONOSCOPY WITH PROPOFOL
Anesthesia: Monitor Anesthesia Care

## 2015-11-24 MED ORDER — PROPOFOL 10 MG/ML IV BOLUS
INTRAVENOUS | Status: DC | PRN
  Administered 2015-11-24 (×2): 20 mg via INTRAVENOUS
  Administered 2015-11-24: 40 mg via INTRAVENOUS
  Administered 2015-11-24: 20 mg via INTRAVENOUS
  Administered 2015-11-24: 40 mg via INTRAVENOUS
  Administered 2015-11-24 (×6): 20 mg via INTRAVENOUS
  Administered 2015-11-24: 40 mg via INTRAVENOUS
  Administered 2015-11-24 (×5): 20 mg via INTRAVENOUS
  Administered 2015-11-24: 40 mg via INTRAVENOUS
  Administered 2015-11-24: 20 mg via INTRAVENOUS

## 2015-11-24 MED ORDER — PROPOFOL 10 MG/ML IV BOLUS
INTRAVENOUS | Status: AC
  Filled 2015-11-24: qty 40

## 2015-11-24 MED ORDER — PROPOFOL 10 MG/ML IV BOLUS
INTRAVENOUS | Status: AC
  Filled 2015-11-24: qty 20

## 2015-11-24 MED ORDER — LACTATED RINGERS IV SOLN
INTRAVENOUS | Status: DC
  Administered 2015-11-24: 11:00:00 via INTRAVENOUS

## 2015-11-24 MED ORDER — SODIUM CHLORIDE 0.9 % IV SOLN: INTRAVENOUS | Status: DC

## 2015-11-24 SURGICAL SUPPLY — 22 items

## 2015-11-24 NOTE — Discharge Instructions (Signed)

## 2015-11-24 NOTE — H&P (Signed)
  Procedure: Surveillance colonoscopy. 09/12/2008 normal surveillance colonoscopy was performed. 07/18/2003 colonoscopy was performed with removal of a 3 mm cecal polyp, 2 mm descending colon polyp, and 3 mm rectal polyp. Polyps return adenomatous.  History: The patient is a 60 year old female born [birth year]. She is scheduled to undergo a surveillance colonoscopy today. She takes xarelto anticoagulant due to atrial fibrillation.  Past medical history: Sinus surgery. Bilateral tubal ligation. Right knee meniscectomy. Cardiac ablation to treat atrial fibrillation. Obstructive sleep apnea. Hypertension. Hypercholesterolemia. Mitral valve prolapse syndrome. Gastroesophageal reflux. Fibrocystic breast disease.  Allergies: Latex  Exam: The patient is alert and lying comfortably on the endoscopy stretcher. Abdomen is soft and nontender to palpation. Lungs are clear to auscultation. Cardiac exam reveals a regular rhythm  Plan: Proceed with surveillance colonoscopy

## 2015-11-24 NOTE — Transfer of Care (Signed)
Immediate Anesthesia Transfer of Care Note  Patient: Kellie Reynolds  Procedure(s) Performed: Procedure(s): COLONOSCOPY WITH PROPOFOL (N/A)  Patient Location: PACU  Anesthesia Type:MAC  Level of Consciousness: sedated  Airway & Oxygen Therapy: Patient Spontanous Breathing and Patient connected to nasal cannula oxygen  Post-op Assessment: Report given to RN and Post -op Vital signs reviewed and stable  Post vital signs: Reviewed and stable  Last Vitals:  Vitals:   11/24/15 1115  BP: (!) 145/60  Pulse: 67  Resp: 16  Temp: 36.7 C    Last Pain:  Vitals:   11/24/15 1115  TempSrc: Oral         Complications: No apparent anesthesia complications

## 2015-11-24 NOTE — Op Note (Signed)
[facility] Patient Name: Kellie Reynolds Procedure Date: 11/24/2015 MRN: [id number] Attending MD: Garlan Fair , MD Date of Birth: [birth year] CSN: [id number] Age: 60 Admit Type: Outpatient Procedure:                Colonoscopy Indications:              High risk colon cancer surveillance: Personal                            history of adenoma less than 10 mm in size Providers:                Garlan Fair, MD, Cleda Daub, RN, [facility], Technician, Mullan Alday CRNA, CRNA Referring MD:              Medicines:                Propofol per Anesthesia Complications:            No immediate complications. Estimated Blood Loss:     Estimated blood loss: none. Procedure:                Pre-Anesthesia Assessment:                           - Prior to the procedure, a History and Physical                            was performed, and patient medications and                            allergies were reviewed. The patient's tolerance of                            previous anesthesia was also reviewed. The risks                            and benefits of the procedure and the sedation                            options and risks were discussed with the patient.                            All questions were answered, and informed consent                            was obtained. Prior Anticoagulants: The patient has                            taken Xarelto (rivaroxaban), last dose was 5 days                            prior to procedure. ASA Grade Assessment: II - A  patient with mild systemic disease. After reviewing                            the risks and benefits, the patient was deemed in                            satisfactory condition to undergo the procedure.                           After obtaining informed consent, the colonoscope                            was passed under direct vision.  Throughout the                            procedure, the patient's blood pressure, pulse, and                            oxygen saturations were monitored continuously. The                            [id number]) scope was introduced through                            the anus and advanced to the the cecum, identified                            by appendiceal orifice and ileocecal valve. The                            colonoscopy was performed without difficulty. The                            patient tolerated the procedure well. The quality                            of the bowel preparation was good. The appendiceal                            orifice and the rectum were photographed. Scope In: 12:27:51 PM Scope Out: 12:54:54 PM Scope Withdrawal Time: 0 hours 20 minutes 4 seconds  Total Procedure Duration: 0 hours 27 minutes 3 seconds  Findings:      The perianal and digital rectal examinations were normal.      A 10 mm sessile serrated appearing polyp on a mucosal fold was found in       the proximal ascending colon. The polyp was sessile. The polyp was       removed with a piecemeal technique using a hot snare and an Endoclip       applied post polypectomy. Resection and retrieval appeared complete.      Multiple small and large-mouthed diverticula were found in the sigmoid       colon.      The exam was otherwise without abnormality. Impression:               -  One 10 mm polyp in the proximal ascending colon,                            removed piecemeal using a hot snare. Resected and                            retrieved.                           - Diverticulosis in the sigmoid colon.                           - The examination was otherwise normal. Moderate Sedation:      N/A- Per Anesthesia Care Recommendation:           - Patient has a contact number available for                            emergencies. The signs and symptoms of potential                             delayed complications were discussed with the                            patient. Return to normal activities tomorrow.                            Written discharge instructions were provided to the                            patient.                           - Patient has a contact number available for                            emergencies. The signs and symptoms of potential                            delayed complications were discussed with the                            patient. Return to normal activities tomorrow.                            Written discharge instructions were provided to the                            patient.                           - Repeat colonoscopy date to be determined after                            pending pathology results are reviewed for  surveillance.                           - Resume previous diet.                           - Continue present medications. Procedure Code(s):        --- Professional ---                           [phone], Colonoscopy, flexible; with removal of                            tumor(s), polyp(s), or other lesion(s) by snare                            technique Diagnosis Code(s):        --- Professional ---                           Z86.010, Personal history of colonic polyps                           D12.2, Benign neoplasm of ascending colon                           K57.30, Diverticulosis of large intestine without                            perforation or abscess without bleeding CPT copyright 2016 American [employer]. All rights reserved. The codes documented in this report are preliminary and upon coder review may  be revised to meet current compliance requirements. Earle Gell, MD Garlan Fair, MD 11/24/2015 1:04:55 PM This report has been signed electronically. Number of Addenda: 0

## 2015-11-24 NOTE — Anesthesia Preprocedure Evaluation (Addendum)
Anesthesia Evaluation  Patient identified by MRN, date of birth, ID band Patient awake    Reviewed: Allergy & Precautions, NPO status , Patient's Chart, lab work & pertinent test results, reviewed documented beta blocker date and time   History of Anesthesia Complications (+) Family history of anesthesia reaction  Airway Mallampati: II  TM Distance: >3 FB Neck ROM: Full    Dental no notable dental hx. (+) Teeth Intact   Pulmonary sleep apnea , Current Smoker,    Pulmonary exam normal breath sounds clear to auscultation       Cardiovascular hypertension, Pt. on medications and Pt. on home beta blockers Normal cardiovascular exam+ dysrhythmias Atrial Fibrillation  Rhythm:Regular Rate:Normal     Neuro/Psych PSYCHIATRIC DISORDERS negative neurological ROS     GI/Hepatic Neg liver ROS, GERD  Controlled and Medicated,Hx/o colon polyps   Endo/Other  Hyperlipidemia  Renal/GU negative Renal ROS  negative genitourinary   Musculoskeletal  (+) Arthritis ,   Abdominal   Peds  Hematology On xarelto for PAF   Anesthesia Other Findings   Reproductive/Obstetrics                            Lab Results  Component Value Date   WBC 6.7 10/07/2014   HGB 13.5 10/07/2014   HCT 39.2 10/07/2014   MCV 92.0 10/07/2014   PLT 144 (L) 10/07/2014     Chemistry      Component Value Date/Time   NA 138 10/07/2014 1600   K 4.5 10/07/2014 1600   CL 103 10/07/2014 1600   CO2 29 10/07/2014 1600   BUN 10 10/07/2014 1600   CREATININE 0.73 10/07/2014 1600      Component Value Date/Time   CALCIUM 9.6 10/07/2014 1600   ALKPHOS 65 04/16/2014 0950   AST 18 04/16/2014 0950   ALT 16 04/16/2014 0950   BILITOT 0.3 04/16/2014 0950     EKG: normal EKG, normal sinus rhythm.  Anesthesia Physical Anesthesia Plan  ASA: III  Anesthesia Plan: MAC   Post-op Pain Management:    Induction:   Airway Management Planned:  Nasal Cannula, Natural Airway and Simple Face Mask  Additional Equipment:   Intra-op Plan:   Post-operative Plan:   Informed Consent: I have reviewed the patients History and Physical, chart, labs and discussed the procedure including the risks, benefits and alternatives for the proposed anesthesia with the patient or authorized representative who has indicated his/her understanding and acceptance.   Dental advisory given  Plan Discussed with: CRNA, Anesthesiologist and Surgeon  Anesthesia Plan Comments:         Anesthesia Quick Evaluation

## 2015-11-24 NOTE — Anesthesia Postprocedure Evaluation (Signed)
Anesthesia Post Note  Patient: Kellie Reynolds  Procedure(s) Performed: Procedure(s) (LRB): COLONOSCOPY WITH PROPOFOL (N/A)  Patient location during evaluation: PACU Anesthesia Type: MAC Level of consciousness: awake and alert and oriented Pain management: pain level controlled Vital Signs Assessment: post-procedure vital signs reviewed and stable Respiratory status: spontaneous breathing, nonlabored ventilation and respiratory function stable Cardiovascular status: blood pressure returned to baseline and stable Postop Assessment: no signs of nausea or vomiting Anesthetic complications: no    Last Vitals:  Vitals:   11/24/15 1300 11/24/15 1305  BP: (!) 130/54   Pulse: 67 (!) 59  Resp: 11 (!) 24  Temp: 36.7 C     Last Pain:  Vitals:   11/24/15 1115  TempSrc: Oral                 Emrah Ariola A.

## 2015-11-25 ENCOUNTER — Encounter (HOSPITAL_COMMUNITY): Payer: Self-pay | Admitting: Gastroenterology

## 2015-12-01 ENCOUNTER — Other Ambulatory Visit: Payer: Self-pay | Admitting: Internal Medicine

## 2016-01-19 ENCOUNTER — Encounter: Payer: Self-pay | Admitting: Internal Medicine

## 2016-01-19 DIAGNOSIS — E7800 Pure hypercholesterolemia, unspecified: Secondary | ICD-10-CM | POA: Diagnosis not present

## 2016-03-24 ENCOUNTER — Other Ambulatory Visit: Payer: Self-pay | Admitting: Internal Medicine

## 2016-03-25 NOTE — Telephone Encounter (Signed)
Medication Detail    Disp Refills Start End   metoprolol (LOPRESSOR) 50 MG tablet 30 tablet 0 03/24/2016    Sig - Route: Take 0.5 tablets (25 mg total) by mouth 2 (two) times daily. *Please call and schedule a one year follow up appointment* - Oral   E-Prescribing Status: Receipt confirmed by pharmacy (03/24/2016 2:37 PM EST)   Pharmacy   [id number] - [place], [place].

## 2016-04-10 ENCOUNTER — Other Ambulatory Visit: Payer: Self-pay | Admitting: Internal Medicine

## 2016-04-13 NOTE — Telephone Encounter (Signed)
Pt requested refill via Pharmacy & sent as requested; refer to medication refill if needed. Per Media tab pt had labs from [facility] on 07/22/15: Cr-0.63, Wt-82.3kg on 11/24/15, Age-61 years old, CrCl-124.15ml/min; therefore the pt will continue to take Xarelto 20mg  daily.

## 2016-04-24 ENCOUNTER — Other Ambulatory Visit: Payer: Self-pay | Admitting: Internal Medicine

## 2016-04-29 ENCOUNTER — Other Ambulatory Visit: Payer: Self-pay | Admitting: Internal Medicine

## 2016-04-30 NOTE — Telephone Encounter (Signed)
Medication Detail    Disp Refills Start End   metoprolol (LOPRESSOR) 50 MG tablet 30 tablet 0 04/26/2016    Sig - Route: Take 0.5 tablets (25 mg total) by mouth 2 (two) times daily. - Oral   Notes to Pharmacy: Please keep upcoming appointment for future refills. Thank you   E-Prescribing Status: Receipt confirmed by pharmacy (04/26/2016 10:59 AM EST)   Pharmacy   [facility] #[id number] - [place], [address]

## 2016-05-03 ENCOUNTER — Encounter: Payer: Self-pay | Admitting: Internal Medicine

## 2016-05-12 ENCOUNTER — Ambulatory Visit (INDEPENDENT_AMBULATORY_CARE_PROVIDER_SITE_OTHER): Payer: BLUE CROSS/BLUE SHIELD | Admitting: Internal Medicine

## 2016-05-12 VITALS — BP 138/80 | HR 64 | Ht 68.0 in | Wt 171.0 lb

## 2016-05-12 DIAGNOSIS — F17200 Nicotine dependence, unspecified, uncomplicated: Secondary | ICD-10-CM | POA: Diagnosis not present

## 2016-05-12 DIAGNOSIS — I480 Paroxysmal atrial fibrillation: Secondary | ICD-10-CM

## 2016-05-12 DIAGNOSIS — I10 Essential (primary) hypertension: Secondary | ICD-10-CM | POA: Diagnosis not present

## 2016-05-12 NOTE — Patient Instructions (Signed)

## 2016-05-12 NOTE — Progress Notes (Signed)
Electrophysiology Office Note   Date:  05/12/2016   ID:  Kellie Reynolds, DOB [birth year], MRN [id number]  PCP:  Donnie Coffin, MD  Cardiologist:  Dr Beau Fanny Primary Electrophysiologist:  Thompson Grayer, MD    Chief Complaint  Patient presents with  . Atrial Fibrillation     History of Present Illness: Kellie Reynolds is a 61 y.o. female who presents today for electrophysiology evaluation.   She presents today for EP follow-up.  She is doing well.   She is maintaining sinus rhythm.  She finds that if she stops metoprolol that she is uncomfortable with increased baseline heart rate and has "skipped beats".  Unfortunately, she continues to smoke.  She was able to quit for 3 months but is now smoking again. Today, she denies symptoms of  chest pain, shortness of breath, orthopnea, PND, lower extremity edema, claudication,  bleeding, or neurologic sequela. The patient is tolerating medications without difficulties and is otherwise without complaint today.    Past Medical History:  Diagnosis Date  . Arthritis    right knee  . Family history of adverse reaction to anesthesia 33 yrs ago   daughter had malignant hyperthermia reaction with acentine, daughter has had surgeries since then and did welll  . Fibrocystic breast disease   . GERD (gastroesophageal reflux disease)   . HTN (hypertension)   . Hypercholesteremia   . MVP (mitral valve prolapse)   . Paroxysmal atrial fibrillation (HCC)    a. s/p PVI  . Skin cancer   . Sleep apnea    uses cpap 4 nights per week, pt does not knoe settings   Past Surgical History:  Procedure Laterality Date  . ATRIAL FIBRILLATION ABLATION N/A 04/18/2012   PVI by Alfonzo Arca  . COLONOSCOPY WITH PROPOFOL N/A 11/24/2015   Procedure: COLONOSCOPY WITH PROPOFOL;  Surgeon: Garlan Fair, MD;  Location: [facility];  Service: Endoscopy;  Laterality: N/A;  . EYE SURGERY Bilateral    lasix eye surgery both eyes  . MENISECTOMY     Right knee  . NASAL POLYP  SURGERY  1990  . NASAL SINUS SURGERY  1990  . TEE WITHOUT CARDIOVERSION  04/17/2012   Procedure: TRANSESOPHAGEAL ECHOCARDIOGRAM (TEE);  Surgeon: Jettie Booze, MD;  Location: [place];  Service: Cardiovascular;  Laterality: N/A;  pre ablation  . TUBAL LIGATION       Current Outpatient Prescriptions  Medication Sig Dispense Refill  . acetaminophen (TYLENOL) 500 MG tablet Take 500 mg by mouth every 6 (six) hours as needed.    Marland Kitchen atorvastatin (LIPITOR) 20 MG tablet Take 20 mg by mouth daily.    . flecainide (TAMBOCOR) 100 MG tablet Take 100 mg by mouth 2 (two) times daily.    Marland Kitchen glucosamine-chondroitin 500-400 MG tablet Take 1 tablet by mouth daily.    Marland Kitchen ibuprofen (ADVIL) 200 MG tablet Take 200 mg by mouth every 6 (six) hours as needed.    Marland Kitchen lisinopril (PRINIVIL,ZESTRIL) 20 MG tablet TAKE 1 TABLET (20 MG TOTAL) BY MOUTH DAILY. 30 tablet 0  . metoprolol (LOPRESSOR) 50 MG tablet Take 0.5 tablets (25 mg total) by mouth 2 (two) times daily. 30 tablet 0  . Omega-3 Fatty Acids (OMEGA-3 FISH OIL PO) Take 2,400 mg by mouth daily.    Marland Kitchen omeprazole (PRILOSEC) 20 MG capsule Take 20 mg by mouth every morning.     . sodium chloride (OCEAN) 0.65 % nasal spray Place 1 spray into the nose 2 (two) times daily as needed. For  dryness    . XARELTO 20 MG TABS tablet TAKE 1 TABLET BY MOUTH EVERY DAY 30 tablet 5   No current facility-administered medications for this visit.     Allergies:   Patient has no known allergies.   Social History:  The patient  reports that she has been smoking.  She has been smoking about 1.00 pack per day. She has never used smokeless tobacco. She reports that she drinks alcohol. She reports that she does not use drugs.   Family History:  The patient's family history includes CAD in her father; Diabetes in her mother; Heart attack in her father; Hyperlipidemia in her father; Hypertension in her father; Liver cancer in her maternal grandmother.    ROS:  Please see the history  of present illness.    All other systems are reviewed and negative.    PHYSICAL EXAM: VS:  BP 138/80   Pulse 64   Ht 5\' 8"  (1.727 m)   Wt 171 lb (77.6 kg)   SpO2 97%   BMI 26.00 kg/m  , BMI Body mass index is 26 kg/m. GEN: Well nourished, well developed, in no acute distress  HEENT: normal  Neck: no JVD, carotid bruits, or masses Cardiac: RRR; no murmurs, rubs, or gallops,no edema  Respiratory:  clear to auscultation bilaterally, normal work of breathing GI: soft, nontender, nondistended, + BS MS: no deformity or atrophy  Skin: warm and dry  Neuro:  Strength and sensation are intact Psych: euthymic mood, full affect  EKG:  EKG is ordered today. The ekg personally reviewed today shows sinus rhythm 65 bpm, PR 186 msec, Qtc 418 msec, otherwise normal ekg   Lipid Panel     Component Value Date/Time   CHOL 184 04/16/2014 0950   TRIG 102.0 04/16/2014 0950   HDL 46.70 04/16/2014 0950   CHOLHDL 4 04/16/2014 0950   VLDL 20.4 04/16/2014 0950   LDLCALC 117 (H) 04/16/2014 0950     Wt Readings from Last 3 Encounters:  05/12/16 171 lb (77.6 kg)  11/24/15 181 lb (82.1 kg)  04/14/15 181 lb 6.4 oz (82.3 kg)     ASSESSMENT AND PLAN:  1.  Paroxysmal atrial fibrillation Well controlled, maintaining sinus rhythm post ablation She is afraid to stop flecainide or metoprolol  chads2vasc score is at least 2.  Continue xarelto. Wishes to have CrCl followed by PCP  2. HTN Stable No change required today Wishes to have PCP follow bmet (labs 07/22/2015 are reviewed today)  3. Tobacco Cessation advised   Return in 1 year She will contact my office should problems arise in the interim   Current medicines are reviewed at length with the patient today.   The patient does not have concerns regarding her medicines.  The following changes were made today:  none   Signed, Thompson Grayer, MD  05/12/2016 10:41 AM     [facility] [address] [place]  [place]  [phone] (office) [phone] (fax)

## 2016-05-15 ENCOUNTER — Other Ambulatory Visit: Payer: Self-pay | Admitting: Internal Medicine

## 2016-05-30 ENCOUNTER — Other Ambulatory Visit: Payer: Self-pay | Admitting: Internal Medicine

## 2016-06-01 ENCOUNTER — Other Ambulatory Visit: Payer: Self-pay | Admitting: Internal Medicine

## 2016-06-22 ENCOUNTER — Telehealth: Payer: Self-pay | Admitting: *Deleted

## 2016-06-22 NOTE — Telephone Encounter (Signed)
PA for patients XARELTO approved, [facility]

## 2016-10-14 ENCOUNTER — Other Ambulatory Visit: Payer: Self-pay | Admitting: Family Medicine

## 2016-10-14 ENCOUNTER — Other Ambulatory Visit (HOSPITAL_COMMUNITY)
Admission: RE | Admit: 2016-10-14 | Discharge: 2016-10-14 | Disposition: A | Discharge status: Home / Self Care | Payer: BLUE CROSS/BLUE SHIELD | Source: Ambulatory Visit | Attending: Family Medicine | Admitting: Family Medicine

## 2016-10-14 DIAGNOSIS — I10 Essential (primary) hypertension: Secondary | ICD-10-CM | POA: Diagnosis not present

## 2016-10-14 DIAGNOSIS — E7800 Pure hypercholesterolemia, unspecified: Secondary | ICD-10-CM | POA: Diagnosis not present

## 2016-10-14 DIAGNOSIS — Z124 Encounter for screening for malignant neoplasm of cervix: Secondary | ICD-10-CM | POA: Diagnosis not present

## 2016-10-14 DIAGNOSIS — Z0000 Encounter for general adult medical examination without abnormal findings: Secondary | ICD-10-CM | POA: Diagnosis not present

## 2016-10-14 DIAGNOSIS — M1990 Unspecified osteoarthritis, unspecified site: Secondary | ICD-10-CM | POA: Diagnosis not present

## 2016-10-18 LAB — CYTOLOGY - PAP: DIAGNOSIS: NEGATIVE

## 2016-10-22 ENCOUNTER — Other Ambulatory Visit: Payer: Self-pay | Admitting: Internal Medicine

## 2016-10-22 NOTE — Telephone Encounter (Signed)
Age 61 years Wt 77.6kg 05/12/2016 Saw Dr Rayann Heman 05/12/2016 SrCr done at [facility] Alroy Dust and was 0.60 done on 10/14/2016 CrCl 122.148 Refill done for Xarelto 20 mg daily

## 2016-10-25 DIAGNOSIS — M1711 Unilateral primary osteoarthritis, right knee: Secondary | ICD-10-CM | POA: Diagnosis not present

## 2016-10-25 DIAGNOSIS — M7062 Trochanteric bursitis, left hip: Secondary | ICD-10-CM | POA: Diagnosis not present

## 2017-02-01 DIAGNOSIS — Z1231 Encounter for screening mammogram for malignant neoplasm of breast: Secondary | ICD-10-CM | POA: Diagnosis not present

## 2017-03-20 ENCOUNTER — Other Ambulatory Visit: Payer: Self-pay | Admitting: Internal Medicine

## 2017-04-21 ENCOUNTER — Other Ambulatory Visit: Payer: Self-pay | Admitting: Internal Medicine

## 2017-04-21 ENCOUNTER — Other Ambulatory Visit: Payer: Self-pay | Admitting: Interventional Cardiology

## 2017-04-21 NOTE — Telephone Encounter (Signed)
Xarelto 20mg  refill request received; pt is 62 yrs old, wt-77.6kg, Crea-0.60 10/14/16, last seen by Dr. Rayann Heman on 05/12/16, CrCl-122.84ml/min; will send in refill to requested pharmacy.

## 2017-05-08 ENCOUNTER — Other Ambulatory Visit: Payer: Self-pay | Admitting: Internal Medicine

## 2017-05-20 ENCOUNTER — Other Ambulatory Visit: Payer: Self-pay | Admitting: Internal Medicine

## 2017-06-06 ENCOUNTER — Other Ambulatory Visit: Payer: Self-pay | Admitting: Internal Medicine

## 2017-06-13 ENCOUNTER — Ambulatory Visit: Payer: BLUE CROSS/BLUE SHIELD | Admitting: Internal Medicine

## 2017-06-13 ENCOUNTER — Encounter: Payer: Self-pay | Admitting: Internal Medicine

## 2017-06-13 VITALS — BP 126/72 | HR 73 | Ht 68.0 in | Wt 159.0 lb

## 2017-06-13 DIAGNOSIS — I10 Essential (primary) hypertension: Secondary | ICD-10-CM | POA: Diagnosis not present

## 2017-06-13 DIAGNOSIS — I480 Paroxysmal atrial fibrillation: Secondary | ICD-10-CM

## 2017-06-13 DIAGNOSIS — R0602 Shortness of breath: Secondary | ICD-10-CM

## 2017-06-13 DIAGNOSIS — F17200 Nicotine dependence, unspecified, uncomplicated: Secondary | ICD-10-CM | POA: Diagnosis not present

## 2017-06-13 NOTE — Progress Notes (Signed)
PCP: Al[facility] Dust, L.Marlou Sa, MD Primary Cardiologist: Dr Irish Lack Primary EP: Dr Rayann Heman  Kellie Reynolds is a 62 y.o. female who presents today for routine electrophysiology followup.  Since last being seen in our clinic, the patient reports doing reasonably well.  She continues to smoke.  She has SOB with moderate activity as well as exertional chest tightness.  She also feels that she is having palpitations, typically lasting only several minutes which she thinks may be afib.   Today, she denies symptoms of lower extremity edema, dizziness, presyncope, or syncope.  The patient is otherwise without complaint today.   Past Medical History:  Diagnosis Date  . Arthritis    right knee  . Family history of adverse reaction to anesthesia 35 yrs ago   daughter had malignant hyperthermia reaction with acentine, daughter has had surgeries since then and did welll  . Fibrocystic breast disease   . GERD (gastroesophageal reflux disease)   . HTN (hypertension)   . Hypercholesteremia   . MVP (mitral valve prolapse)   . Paroxysmal atrial fibrillation (HCC)    a. s/p PVI  . Skin cancer   . Sleep apnea    uses cpap 4 nights per week, pt does not knoe settings   Past Surgical History:  Procedure Laterality Date  . ATRIAL FIBRILLATION ABLATION N/A 04/18/2012   PVI by Stevenson Windmiller  . COLONOSCOPY WITH PROPOFOL N/A 11/24/2015   Procedure: COLONOSCOPY WITH PROPOFOL;  Surgeon: Garlan Fair, MD;  Location: [facility];  Service: Endoscopy;  Laterality: N/A;  . EYE SURGERY Bilateral    lasix eye surgery both eyes  . MENISECTOMY     Right knee  . NASAL POLYP SURGERY  1990  . NASAL SINUS SURGERY  1990  . TEE WITHOUT CARDIOVERSION  04/17/2012   Procedure: TRANSESOPHAGEAL ECHOCARDIOGRAM (TEE);  Surgeon: Jettie Booze, MD;  Location: [place];  Service: Cardiovascular;  Laterality: N/A;  pre ablation  . TUBAL LIGATION      ROS- all systems are reviewed and negatives except as per HPI above  Current  Outpatient Medications  Medication Sig Dispense Refill  . acetaminophen (TYLENOL) 500 MG tablet Take 500 mg by mouth every 6 (six) hours as needed.    Marland Kitchen atorvastatin (LIPITOR) 20 MG tablet Take 20 mg by mouth daily.    . flecainide (TAMBOCOR) 100 MG tablet Take 50 mg by mouth 2 (two) times daily.    Marland Kitchen glucosamine-chondroitin 500-400 MG tablet Take 1 tablet by mouth daily.    Marland Kitchen ibuprofen (ADVIL) 200 MG tablet Take 200 mg by mouth every 6 (six) hours as needed.    Marland Kitchen lisinopril (PRINIVIL,ZESTRIL) 20 MG tablet Take 1 tablet (20 mg total) by mouth daily. Please keep upcoming appt for future refills. Thank you 30 tablet 0  . metoprolol tartrate (LOPRESSOR) 50 MG tablet TAKE 1/2 TABLET BY MOUTH 2 TIMES A DAY 30 tablet 0  . Omega-3 Fatty Acids (OMEGA-3 FISH OIL PO) Take 2,400 mg by mouth daily.    Marland Kitchen omeprazole (PRILOSEC) 20 MG capsule Take 20 mg by mouth every morning.     . sodium chloride (OCEAN) 0.65 % nasal spray Place 1 spray into the nose 2 (two) times daily as needed. For dryness    . XARELTO 20 MG TABS tablet TAKE 1 TABLET BY MOUTH EVERY DAY 30 tablet 3   No current facility-administered medications for this visit.     Physical Exam: Vitals:   06/13/17 1606  BP: 126/72  Pulse: 73  Weight: 72.1 kg (159 lb)  Height: 5\' 8"  (1.727 m)    GEN- The patient is well appearing, alert and oriented x 3 today.   Head- normocephalic, atraumatic Eyes-  Sclera clear, conjunctiva pink Ears- hearing intact Oropharynx- clear Lungs- Clear to ausculation bilaterally, normal work of breathing Heart- Regular rate and rhythm, no murmurs, rubs or gallops, PMI not laterally displaced GI- soft, NT, ND, + BS Extremities- no clubbing, cyanosis, or edema  EKG tracing ordered today is personally reviewed and shows sinus rhythm, poor r wave progression  Assessment and Plan:  1. Paroxysmal atrial fibrillation Previously well controlled post ablation 2014 without recurrence.  She has palpitations of unclear  etiology. I have advised previously that she stop flecainide however she is reluctant to do so.  She worries about ongoing afib. I would therefore advise implantation of an implantable loop recorder to further evaluate her palpitations and for afib management post ablation.  Risks were discussed and she wishes to proceed.  She worries about her work schedule but will contact our office when she is available to have this placed.  chads2vasc score is 2.  Continue xarelto  2. HTN Stable No change required today  3. Tobacco Cessation is advised She is not ready to quit  4. SOB, chest discomfort with exertion Both typical and atypical features Will plan exercise myoview for further CV risk stratification.  I worry that with her ongoing heavy tobacco and elevated bp that she is at increased risk for CAD.  Return in 3 months for further evaluation  Thompson Grayer MD, [facility] 06/13/2017 4:24 PM

## 2017-06-13 NOTE — Patient Instructions (Addendum)
Medication Instructions:  Your physician recommends that you continue on your current medications as directed. Please refer to the Current Medication list given to you today.  Labwork: None ordered.  Testing/Procedure. Your physician has requested that you have en exercise stress myoview. For further information please visit [email or web address]. Please follow instruction sheet, as given.  Please schedule.  Hold metoprolol morning of stress test per Dr. Rayann Heman.   Follow-Up: Your physician wants you to follow-up in: 3 months with Dr. Rayann Heman.     Any Other Special Instructions Will Be Listed Below (If Applicable).  If you need a refill on your cardiac medications before your next appointment, please call your pharmacy.  Dr. Rayann Heman is able to place a loop recorder:  April 9, 12, or 19.  Give me a call when you decide on a day. Sonia Baller RN [phone]  Implantable Loop Recorder Placement An implantable loop recorder is a small electronic device that is placed under the skin of your chest. It is about the size of an AA ("double A") battery. The device records the electrical activity of your heart over a long period of time. Your health care provider can download these recordings to monitor your heart. You may need an implantable loop recorder if you have periods of abnormal heart activity (arrhythmias) or unexplained fainting (syncope) caused by a heart problem. Tell a health care provider about:  Any allergies you have.  All medicines you are taking, including vitamins, herbs, eye drops, creams, and over-the-counter medicines.  Any problems you or family members have had with anesthetic medicines.  Any blood disorders you have.  Any surgeries you have had.  Any medical conditions you have.  Whether you are pregnant or may be pregnant. What are the risks? Generally, this is a safe procedure. However, as with any procedure, problems may occur,  including:  Infection.  Bleeding.  Allergic reactions to anesthetic medicines.  Damage to nerves or blood vessels.  Failure of the device to work. This could require another surgery to replace it.  What happens before the procedure?   You may have a physical exam, blood tests, and imaging tests of your heart, such as a chest X-ray.  Follow instructions from your health care provider about eating or drinking restrictions.  Ask your health care provider about: ? Changing or stopping your regular medicines. This is especially important if you are taking diabetes medicines or blood thinners. ? Taking medicines such as aspirin and ibuprofen. These medicines can thin your blood. Do not take these medicines before your procedure if your surgeon instructs you not to.  Ask your health care provider how your surgical site will be marked or identified.  You may be given antibiotic medicine to help prevent infection.  Plan to have someone take you home after the procedure.  If you will be going home right after the procedure, plan to have someone with you for 24 hours.  Do not use any tobacco products, such as cigarettes, chewing tobacco, and e-cigarettes as told by your surgeon. If you need help quitting, ask your health care provider. What happens during the procedure?  To reduce your risk of infection: ? Your health care team will wash or sanitize their hands. ? Your skin will be washed with soap.  An IV tube will be inserted into one of your veins.  You may be given an antibiotic medicine through the IV tube.  You may be given one or more of the following: ? A medicine to  help you relax (sedative). ? A medicine to numb the area (local anesthetic).  A small cut (incision) will be made on the left side of your upper chest.  A pocket will be created under your skin.  The device will be placed in the pocket.  The incision will be closed with stitches (sutures) or adhesive  strips.  A bandage (dressing) will be placed over the incision. The procedure may vary among health care providers and hospitals. What happens after the procedure?  Your blood pressure, heart rate, breathing rate, and blood oxygen level will be monitored often until the medicines you were given have worn off.  You may be able to go home on the day of your surgery. Before going home: ? Your health care provider will program your recorder. ? You will learn how to trigger your device with a handheld activator. ? You will learn how to send recordings to your health care provider. ? You will get an ID card for your device, and you will be told when to use it.  Do not drive for 24 hours if you received a sedative. This information is not intended to replace advice given to you by your health care provider. Make sure you discuss any questions you have with your health care provider. Document Released: 02/10/2015 Document Revised: 08/07/2015 Document Reviewed: 12/04/2014 Elsevier Interactive Patient Education  [employer].

## 2017-06-14 ENCOUNTER — Telehealth: Payer: Self-pay

## 2017-06-14 NOTE — Telephone Encounter (Signed)
I have done a Xarelto PA through covermymeds and received the following message:  Approved today  Case [id number]; Status:Approved;Review Type:Prior Auth;Coverage Start Date:05/15/2017;Coverage End Date:06/14/2018;  I have notified the pts pharmacy.

## 2017-06-15 ENCOUNTER — Telehealth: Payer: Self-pay | Admitting: Internal Medicine

## 2017-06-15 NOTE — Telephone Encounter (Signed)
Kellie Reynolds is calling to let you know that she is going to wait on the implant at this time . She is not going to schedule it . Please call if you have any questions

## 2017-06-22 ENCOUNTER — Other Ambulatory Visit: Payer: Self-pay | Admitting: Internal Medicine

## 2017-07-06 ENCOUNTER — Other Ambulatory Visit: Payer: Self-pay | Admitting: Internal Medicine

## 2017-07-07 ENCOUNTER — Telehealth (HOSPITAL_COMMUNITY): Payer: Self-pay | Admitting: *Deleted

## 2017-07-07 NOTE — Telephone Encounter (Signed)
Left message on voicemail in reference to upcoming appointment scheduled for  07/11/17 Phone number given for a call back so details instructions can be given. Kirstie Peri, RN

## 2017-07-11 ENCOUNTER — Ambulatory Visit (HOSPITAL_COMMUNITY): Payer: BLUE CROSS/BLUE SHIELD | Attending: Cardiovascular Disease

## 2017-07-11 DIAGNOSIS — R0602 Shortness of breath: Secondary | ICD-10-CM | POA: Insufficient documentation

## 2017-07-11 LAB — MYOCARDIAL PERFUSION IMAGING
CHL CUP NUCLEAR SRS: 1
CSEPEDS: 30 s
CSEPPHR: 153 {beats}/min
Estimated workload: 9.3 METS
Exercise duration (min): 7 min
LVDIAVOL: 131 mL (ref 46–106)
LVSYSVOL: 52 mL
MPHR: 159 {beats}/min
NUC STRESS TID: 0.93
Percent HR: 96 %
RATE: 0.32
Rest HR: 67 {beats}/min
SDS: 1
SSS: 2

## 2017-07-11 MED ORDER — TECHNETIUM TC 99M TETROFOSMIN IV KIT
10.2000 | PACK | Freq: Once | INTRAVENOUS | Status: AC | PRN
  Administered 2017-07-11: 10.2 via INTRAVENOUS
  Filled 2017-07-11: qty 11

## 2017-07-11 MED ORDER — TECHNETIUM TC 99M TETROFOSMIN IV KIT
31.7000 | PACK | Freq: Once | INTRAVENOUS | Status: AC | PRN
  Administered 2017-07-11: 31.7 via INTRAVENOUS
  Filled 2017-07-11: qty 32

## 2017-07-16 ENCOUNTER — Telehealth: Payer: Self-pay | Admitting: Internal Medicine

## 2017-07-18 NOTE — Telephone Encounter (Signed)
Current med list has 50 mg bid listed but pharmacy is requesting 100 mg bid. Looks like it was changed to 50 mg bid on med list at last office visit with a reason of patient reported taking differently. Please advise. Thanks, MI

## 2017-07-19 MED ORDER — FLECAINIDE ACETATE 100 MG PO TABS: 50.0000 mg | ORAL_TABLET | Freq: Two times a day (BID) | ORAL | 3 refills | Status: DC

## 2017-07-19 NOTE — Addendum Note (Signed)
Addended by: Willeen Cass A on: 07/19/2017 08:58 AM   Modules accepted: Orders

## 2017-07-20 ENCOUNTER — Telehealth: Payer: Self-pay

## 2017-07-20 NOTE — Telephone Encounter (Signed)
error 

## 2017-08-21 ENCOUNTER — Other Ambulatory Visit: Payer: Self-pay | Admitting: Internal Medicine

## 2017-08-22 NOTE — Telephone Encounter (Addendum)
Xarelto 20mg  refill request received; pt is 62 yrs old, wt-72.1kg, Crea-0.60 via PCP on 10/14/16, last seen by Dr. Rayann Heman on 06/13/17, CrCl-112.26ml/min; will send in refill to requested pharmacy. Placed a note on pt's appt in July with Dr. Rayann Heman to obtain Xarelto f/u labs.

## 2017-09-09 ENCOUNTER — Encounter: Payer: Self-pay | Admitting: Internal Medicine

## 2017-09-19 ENCOUNTER — Ambulatory Visit: Payer: BLUE CROSS/BLUE SHIELD | Admitting: Internal Medicine

## 2017-09-19 DIAGNOSIS — R0989 Other specified symptoms and signs involving the circulatory and respiratory systems: Secondary | ICD-10-CM

## 2017-09-21 ENCOUNTER — Encounter: Payer: Self-pay | Admitting: Internal Medicine

## 2017-10-20 ENCOUNTER — Other Ambulatory Visit: Payer: Self-pay | Admitting: Internal Medicine

## 2017-11-28 ENCOUNTER — Ambulatory Visit
Admission: RE | Admit: 2017-11-28 | Discharge: 2017-11-28 | Disposition: A | Discharge status: Home / Self Care | Payer: BLUE CROSS/BLUE SHIELD | Source: Ambulatory Visit | Attending: Family Medicine | Admitting: Family Medicine

## 2017-11-28 ENCOUNTER — Other Ambulatory Visit: Payer: Self-pay | Admitting: Family Medicine

## 2017-11-28 DIAGNOSIS — R634 Abnormal weight loss: Secondary | ICD-10-CM

## 2017-11-28 DIAGNOSIS — Z0000 Encounter for general adult medical examination without abnormal findings: Secondary | ICD-10-CM | POA: Diagnosis not present

## 2017-11-28 DIAGNOSIS — E7800 Pure hypercholesterolemia, unspecified: Secondary | ICD-10-CM | POA: Diagnosis not present

## 2017-11-28 DIAGNOSIS — R946 Abnormal results of thyroid function studies: Secondary | ICD-10-CM | POA: Diagnosis not present

## 2017-12-14 ENCOUNTER — Encounter: Payer: Self-pay | Admitting: Internal Medicine

## 2017-12-14 ENCOUNTER — Ambulatory Visit: Payer: BLUE CROSS/BLUE SHIELD | Admitting: Internal Medicine

## 2017-12-14 VITALS — BP 122/68 | HR 64 | Ht 68.0 in | Wt 151.6 lb

## 2017-12-14 DIAGNOSIS — F17200 Nicotine dependence, unspecified, uncomplicated: Secondary | ICD-10-CM | POA: Diagnosis not present

## 2017-12-14 DIAGNOSIS — I480 Paroxysmal atrial fibrillation: Secondary | ICD-10-CM | POA: Diagnosis not present

## 2017-12-14 DIAGNOSIS — R0602 Shortness of breath: Secondary | ICD-10-CM | POA: Diagnosis not present

## 2017-12-14 DIAGNOSIS — I10 Essential (primary) hypertension: Secondary | ICD-10-CM | POA: Diagnosis not present

## 2017-12-14 NOTE — Patient Instructions (Addendum)
Medication Instructions:  Your physician recommends that you continue on your current medications as directed. Please refer to the Current Medication list given to you today.  Labwork: None ordered.  Testing/Procedures: None ordered.  Follow-Up: Your physician wants you to follow-up in: 6 months with Renee Ursuy, PA. You will receive a reminder letter in the mail two months in advance. If you don't receive a letter, please call our office to schedule the follow-up appointment.   Any Other Special Instructions Will Be Listed Below (If Applicable).     If you need a refill on your cardiac medications before your next appointment, please call your pharmacy.  

## 2017-12-14 NOTE — Progress Notes (Signed)
PCP: Alroy Dust, L.Marlou Sa, MD Primary Cardiologist: Dr Irish Lack Primary EP: Dr Rayann Heman  Kellie Reynolds is a 62 y.o. female who presents today for routine electrophysiology followup.  + rare palpitations.  SOB is improved, though she continues to smoke.  Denies chest discomfort.  Today, she denies symptoms of palpitations,  lower extremity edema, dizziness, presyncope, or syncope.  The patient is otherwise without complaint today.   Past Medical History:  Diagnosis Date  . Arthritis    right knee  . Family history of adverse reaction to anesthesia 29 yrs ago   daughter had malignant hyperthermia reaction with acentine, daughter has had surgeries since then and did welll  . Fibrocystic breast disease   . GERD (gastroesophageal reflux disease)   . HTN (hypertension)   . Hypercholesteremia   . MVP (mitral valve prolapse)   . Paroxysmal atrial fibrillation (HCC)    a. s/p PVI  . Skin cancer   . Sleep apnea    uses cpap 4 nights per week, pt does not knoe settings   Past Surgical History:  Procedure Laterality Date  . ATRIAL FIBRILLATION ABLATION N/A 04/18/2012   PVI by Fumiye Lubben  . COLONOSCOPY WITH PROPOFOL N/A 11/24/2015   Procedure: COLONOSCOPY WITH PROPOFOL;  Surgeon: Garlan Fair, MD;  Location: [facility];  Service: Endoscopy;  Laterality: N/A;  . EYE SURGERY Bilateral    lasix eye surgery both eyes  . MENISECTOMY     Right knee  . NASAL POLYP SURGERY  1990  . NASAL SINUS SURGERY  1990  . TEE WITHOUT CARDIOVERSION  04/17/2012   Procedure: TRANSESOPHAGEAL ECHOCARDIOGRAM (TEE);  Surgeon: Jettie Booze, MD;  Location: [facility];  Service: Cardiovascular;  Laterality: N/A;  pre ablation  . TUBAL LIGATION      ROS- all systems are reviewed and negatives except as per HPI above  Current Outpatient Medications  Medication Sig Dispense Refill  . acetaminophen (TYLENOL) 500 MG tablet Take 500 mg by mouth every 6 (six) hours as needed.    Marland Kitchen atorvastatin (LIPITOR) 20 MG  tablet Take 20 mg by mouth daily.    . flecainide (TAMBOCOR) 100 MG tablet Take 0.5 tablets (50 mg total) by mouth 2 (two) times daily. 90 tablet 3  . lisinopril (PRINIVIL,ZESTRIL) 20 MG tablet Take 1 tablet (20 mg total) by mouth daily. 30 tablet 11  . metoprolol tartrate (LOPRESSOR) 50 MG tablet TAKE 1/2 TABLET BY MOUTH 2 TIMES A DAY 30 tablet 7  . Omega-3 Fatty Acids (OMEGA-3 FISH OIL PO) Take 2,400 mg by mouth daily.    . sodium chloride (OCEAN) 0.65 % nasal spray Place 1 spray into the nose 2 (two) times daily as needed. For dryness    . XARELTO 20 MG TABS tablet TAKE 1 TABLET BY MOUTH EVERY DAY 30 tablet 3   No current facility-administered medications for this visit.     Physical Exam: Vitals:   12/14/17 1421  BP: 122/68  Pulse: 64  SpO2: 97%  Weight: 151 lb 9.6 oz (68.8 kg)  Height: 5\' 8"  (1.727 m)    GEN- The patient is well appearing, alert and oriented x 3 today.   Head- normocephalic, atraumatic Eyes-  Sclera clear, conjunctiva pink Ears- hearing intact Oropharynx- clear Lungs- Clear to ausculation bilaterally, normal work of breathing Heart- Regular rate and rhythm, no murmurs, rubs or gallops, PMI not laterally displaced GI- soft, NT, ND, + BS Extremities- no clubbing, cyanosis, or edema  Wt Readings from Last 3 Encounters:  12/14/17 151 lb 9.6 oz (68.8 kg)  07/11/17 159 lb (72.1 kg)  06/13/17 159 lb (72.1 kg)    EKG tracing ordered today is personally reviewed and shows sinus rhythm, 64 bpm, PR 164 msec, QRS 80 msec, Qtc 416 msec  Assessment and Plan:  1. Paroxysmal atrial fibrillation Palpitations have improved  We discussed ILR previously, she decided to not pursue this. chads2vasc score is 2.  We discussed 2019 guidelines update which suggests that she could stop anticoagualtion.  She is clear that she wishes to continue xarelto at this time.  I also advised that we could use ILR to follow for afib burden and that this information may also be helpful in  guiding anticoagulation in the future.  2. Tobacco Cessation advised She is not ready to quit  3. HTN Stable No change required today  4. SOB Improved, myoview 07/11/17 reviewed with her today (normal) No further CV workup planned   Return to see EP PA in 6 months  Thompson Grayer MD, [facility] 12/14/2017 2:27 PM

## 2017-12-19 ENCOUNTER — Other Ambulatory Visit: Payer: Self-pay | Admitting: Internal Medicine

## 2018-02-10 DIAGNOSIS — Z1239 Encounter for other screening for malignant neoplasm of breast: Secondary | ICD-10-CM | POA: Diagnosis not present

## 2018-02-10 DIAGNOSIS — Z1231 Encounter for screening mammogram for malignant neoplasm of breast: Secondary | ICD-10-CM | POA: Diagnosis not present

## 2018-06-09 ENCOUNTER — Telehealth: Payer: Self-pay | Admitting: Internal Medicine

## 2018-06-09 NOTE — Telephone Encounter (Signed)
Returned call to Pt.  Advised we are still seeing patent's but in a virtual environment.   Briefly discussed what that looks like.  Advised Pt as first step to set up her MyChart.  Sent her an invitation.  Advised she would get a call  Next week to complete other parts of her visit.  Pt states she is doing well.  Was happy to participate.

## 2018-06-09 NOTE — Telephone Encounter (Signed)
New Message:    Pt wants to know if she still needs to keep her appt on 06-14-18?

## 2018-06-10 ENCOUNTER — Telehealth: Payer: Self-pay

## 2018-06-10 NOTE — Telephone Encounter (Signed)
Called pt to discuss virtual visit set up. Pt phone did not give an option to leave a message.

## 2018-06-12 ENCOUNTER — Telehealth (INDEPENDENT_AMBULATORY_CARE_PROVIDER_SITE_OTHER): Payer: BLUE CROSS/BLUE SHIELD | Admitting: Internal Medicine

## 2018-06-12 ENCOUNTER — Other Ambulatory Visit: Payer: Self-pay

## 2018-06-12 DIAGNOSIS — F17200 Nicotine dependence, unspecified, uncomplicated: Secondary | ICD-10-CM | POA: Diagnosis not present

## 2018-06-12 DIAGNOSIS — I10 Essential (primary) hypertension: Secondary | ICD-10-CM | POA: Diagnosis not present

## 2018-06-12 DIAGNOSIS — I480 Paroxysmal atrial fibrillation: Secondary | ICD-10-CM

## 2018-06-12 NOTE — Progress Notes (Signed)
Electrophysiology TeleHealth Note   Due to national recommendations of social distancing due to COVID 19, an audio/video telehealth visit is felt to be most appropriate for this patient at this time.  See MyChart message from today for the patient's consent to telehealth for [facility].   Date:  06/12/2018   ID:  Kellie Reynolds, DOB 02/15/1956, MRN [id number]  Location: patient's home  Provider location: [address], [place]  Evaluation Performed: Follow-up visit  PCP:  Kellie Reynolds, L.Marlou Sa, MD  Electrophysiologist:  Dr Rayann Heman  Chief Complaint:  afib  History of Present Illness:    Kellie Reynolds is a 63 y.o. female who presents via audio/video conferencing for a telehealth visit today.  Since last being seen in our clinic, the patient reports doing very well.  Today, she denies symptoms of palpitations, chest pain, shortness of breath,  lower extremity edema, dizziness, presyncope, or syncope.  She has rare palpitations which she attributes to stress.  The patient is otherwise without complaint today.  The patient denies symptoms of fevers, chills, cough, or new SOB worrisome for COVID 19.  Past Medical History:  Diagnosis Date   Arthritis    right knee   Family history of adverse reaction to anesthesia 76 yrs ago   daughter had malignant hyperthermia reaction with acentine, daughter has had surgeries since then and did welll   Fibrocystic breast disease    GERD (gastroesophageal reflux disease)    HTN (hypertension)    Hypercholesteremia    MVP (mitral valve prolapse)    Paroxysmal atrial fibrillation (HCC)    a. s/p PVI   Skin cancer    Sleep apnea    uses cpap 4 nights per week, pt does not knoe settings    Past Surgical History:  Procedure Laterality Date   ATRIAL FIBRILLATION ABLATION N/A 04/18/2012   PVI by Daeron Carreno   COLONOSCOPY WITH PROPOFOL N/A 11/24/2015   Procedure: COLONOSCOPY WITH PROPOFOL;  Surgeon: Garlan Fair, MD;   Location: [facility];  Service: Endoscopy;  Laterality: N/A;   EYE SURGERY Bilateral    lasix eye surgery both eyes   MENISECTOMY     Right knee   NASAL POLYP SURGERY  1990   NASAL SINUS SURGERY  1990   TEE WITHOUT CARDIOVERSION  04/17/2012   Procedure: TRANSESOPHAGEAL ECHOCARDIOGRAM (TEE);  Surgeon: Jettie Booze, MD;  Location: [facility] END[facility]COPY;  Service: Cardiovascular;  Laterality: N/A;  pre ablation   TUBAL LIGATION      Current Outpatient Medications  Medication Sig Dispense Refill   acetaminophen (TYLENOL) 500 MG tablet Take 500 mg by mouth every 6 (six) hours as needed.     atorvastatin (LIPITOR) 20 MG tablet Take 20 mg by mouth daily.     flecainide (TAMBOCOR) 100 MG tablet Take 0.5 tablets (50 mg total) by mouth 2 (two) times daily. 90 tablet 3   lisinopril (PRINIVIL,ZESTRIL) 20 MG tablet Take 1 tablet (20 mg total) by mouth daily. 30 tablet 11   metoprolol tartrate (LOPRESSOR) 50 MG tablet TAKE 1/2 TABLET BY MOUTH 2 TIMES A DAY 30 tablet 7   rivaroxaban (XARELTO) 20 MG TABS tablet Take 1 tablet (20 mg total) by mouth daily with supper. 30 tablet 5   sodium chloride (OCEAN) 0.65 % nasal spray Place 1 spray into the nose 2 (two) times daily as needed. For dryness     No current facility-administered medications for this visit.     Allergies:   Patient  has no known allergies.   Social History:  The patient  reports that she has been smoking. She has been smoking about 1.00 pack per day. She has never used smokeless tobacco. She reports current alcohol use. She reports that she does not use drugs.   Family History:  The patient's  family history includes CAD in her father; Diabetes in her mother; Heart attack in her father; Hyperlipidemia in her father; Hypertension in her father; Liver cancer in her maternal grandmother.   ROS:  Please see the history of present illness.   All other systems are personally reviewed and negative.    Exam:    Vital Signs:  She  did not take today  Well appearing, alert and conversant, regular work of breathing,  good skin color Eyes- anicteric, neuro- grossly intact, skin- no apparent rash or lesions or cyanosis, mouth- oral mucosa is pink   Labs/Other Tests and Data Reviewed:    Recent Labs: No results found for requested labs within last 8760 hours.   Wt Readings from Last 3 Encounters:  12/14/17 151 lb 9.6 oz (68.8 kg)  07/11/17 159 lb (72.1 kg)  06/13/17 159 lb (72.1 kg)     Other studies personally reviewed: Additional studies/ records that were reviewed today include: my prior office notes  Review of the above records today demonstrates: as above   ASSESSMENT & PLAN:    1.  Paroxysmal atrial fibrillation palpitations are much improved.  We could consider long term monitoring with ILR if they worsen. chads2vasc score is 2.  She has decided to stop anticoagulation previously.  2. HTN Stable No change required today  3. Tobacco Cessation advised  4. COVID 19 screen The patient denies symptoms of COVID 19 at this time.  The importance of social distancing was discussed today.  Follow-up:  Return to see me in 6 months  Current medicines are reviewed at length with the patient today.   The patient does not have concerns regarding her medicines.  The following changes were made today:  none  Labs/ tests ordered today include: No orders of the defined types were placed in this encounter.  Patient Risk:  after full review of this patients clinical status, I feel that they are at moderate risk at this time.  Today, I have spent 20 minutes with the patient with telehealth technology discussing afib .    SignedThompson Grayer, MD  06/12/2018 8:59 PM     [place] [address] [phone] (office) [phone] (fax)

## 2018-06-14 ENCOUNTER — Telehealth: Payer: BLUE CROSS/BLUE SHIELD | Admitting: Internal Medicine

## 2018-06-16 ENCOUNTER — Other Ambulatory Visit: Payer: Self-pay | Admitting: Internal Medicine

## 2018-06-17 ENCOUNTER — Other Ambulatory Visit: Payer: Self-pay | Admitting: Internal Medicine

## 2018-07-05 ENCOUNTER — Other Ambulatory Visit: Payer: Self-pay | Admitting: Internal Medicine

## 2018-08-13 ENCOUNTER — Other Ambulatory Visit: Payer: Self-pay | Admitting: Internal Medicine

## 2018-11-30 DIAGNOSIS — Z0000 Encounter for general adult medical examination without abnormal findings: Secondary | ICD-10-CM | POA: Diagnosis not present

## 2018-12-06 ENCOUNTER — Other Ambulatory Visit: Payer: Self-pay | Admitting: Internal Medicine

## 2018-12-12 ENCOUNTER — Other Ambulatory Visit: Payer: Self-pay | Admitting: Internal Medicine

## 2018-12-12 NOTE — Telephone Encounter (Signed)
Pt 64f 68.8kg Scr 0.64 11/28/17 (outdated) needs to update Lovw/allred 06/12/18 ccr 99 Dose appropriate and will send in a refill

## 2018-12-12 NOTE — Telephone Encounter (Signed)
Called pharmacy and requested one refill instead of 4

## 2018-12-12 NOTE — Telephone Encounter (Signed)
Pt 17f 68.8kg Scr 0.64 11/28/17 Lovw/allred 06/12/18 ccr 99 This encounter was created in error - please disregard.

## 2018-12-12 NOTE — Telephone Encounter (Signed)
Xarelto 20mg  refill request received; pt is 63 years old, weight-68.8kg, Crea- 0.64 on 11/28/2017 via [facility] at [facility] and needs updated labs-called Dr. Randel Pigg office and they stated they drew labs on 11/30/2018 and the results have not been viewed at this time and is awaiting Dr. Randel Pigg viewing and she will fax to [place] once reviewed, last seen by Dr. Rayann Heman on 06/12/2018, Diagnosis-Afib, [place].27ml/min; Dose is appropriate based on dosing criteria. Will send in refill to requested pharmacy for a limited supply since awaiting labs to be be reviewed and faxed over.

## 2018-12-13 DIAGNOSIS — Z23 Encounter for immunization: Secondary | ICD-10-CM | POA: Diagnosis not present

## 2018-12-13 DIAGNOSIS — E7800 Pure hypercholesterolemia, unspecified: Secondary | ICD-10-CM | POA: Diagnosis not present

## 2018-12-25 ENCOUNTER — Other Ambulatory Visit: Payer: Self-pay | Admitting: Internal Medicine

## 2019-01-04 NOTE — Progress Notes (Signed)
Cardiology Office Note Date:  01/08/2019  Patient ID:  Kellie Reynolds, Kellie Reynolds 1956/01/03, MRN [id number] PCP:  Aurea Graff.Marlou Sa, MD  Electrophysiologist:  Dr. Rayann Heman     Chief Complaint: annual EP visit  History of Present Illness: Kellie Reynolds is a 63 y.o. female with history of GERD, HTN, HLD, MVP, ongoing smoker, and PAFib s/p PVI ablation 2014.  She comes in today to be seen for Dr. Rayann Heman.   12/2017. She was seen with rare palpitations and previously discussed loop not pursued.She was doing well, discussed with new guidelines, could consider stopping her [place] though the patient wanted to stay on her Xarelto.  No changes were made and she was planned for 6 mo visit. She had a virtual visit with Dr. Rayann Heman March 2020, was doing well, no changes were made.  Discussed ILR if palpitations were to worsen  She is doing well.  She says she takes her Flecainide daily and only a second dose if needed.  This she says in d/w Dr. Rayann Heman in attempt to reduce or eliminate medicines.   She mentions taking a second dose at most 2x a month only and is very happy with her AFib management  She has had a couple high L chest sharp/fleeting pains that shoot up the side of her neck.  This lasts only a second.  No SOB, no dizzy spells, no near syncope or syncope.  She denies bleeding or signs of bleeding. She mentions that she had over the summer worsening mood stability and increased her smoking and drinking , in d/w her PMD started on Citalopram and this has done wonders for her.  She feels better, less agitated and has cut back on her smoking and drinking.   Past Medical History:  Diagnosis Date  . Arthritis    right knee  . Family history of adverse reaction to anesthesia 11 yrs ago   daughter had malignant hyperthermia reaction with acentine, daughter has had surgeries since then and did welll  . Fibrocystic breast disease   . GERD (gastroesophageal reflux disease)   . HTN (hypertension)   .  Hypercholesteremia   . MVP (mitral valve prolapse)   . Paroxysmal atrial fibrillation (HCC)    a. s/p PVI  . Skin cancer   . Sleep apnea    uses cpap 4 nights per week, pt does not knoe settings    Past Surgical History:  Procedure Laterality Date  . ATRIAL FIBRILLATION ABLATION N/A 04/18/2012   PVI by Allred  . COLONOSCOPY WITH PROPOFOL N/A 11/24/2015   Procedure: COLONOSCOPY WITH PROPOFOL;  Surgeon: Garlan Fair, MD;  Location: [facility];  Service: Endoscopy;  Laterality: N/A;  . EYE SURGERY Bilateral    lasix eye surgery both eyes  . MENISECTOMY     Right knee  . NASAL POLYP SURGERY  1990  . NASAL SINUS SURGERY  1990  . TEE WITHOUT CARDIOVERSION  04/17/2012   Procedure: TRANSESOPHAGEAL ECHOCARDIOGRAM (TEE);  Surgeon: Jettie Booze, MD;  Location: [facility];  Service: Cardiovascular;  Laterality: N/A;  pre ablation  . TUBAL LIGATION      Current Outpatient Medications  Medication Sig Dispense Refill  . acetaminophen (TYLENOL) 500 MG tablet Take 500 mg by mouth every 6 (six) hours as needed.    Marland Kitchen atorvastatin (LIPITOR) 20 MG tablet Take 20 mg by mouth daily.    . citalopram (CELEXA) 10 MG tablet Take 10 mg by mouth daily.    . flecainide (TAMBOCOR) 100  MG tablet TAKE 1/2 TABLET BY MOUTH 2 TIMES DAILY 90 tablet 1  . lisinopril (ZESTRIL) 20 MG tablet TAKE 1 TABLET BY MOUTH EVERY DAY 30 tablet 11  . metoprolol tartrate (LOPRESSOR) 50 MG tablet Take 0.5 tablets (25 mg total) by mouth 2 (two) times daily. 90 tablet 1  . sodium chloride (OCEAN) 0.65 % nasal spray Place 1 spray into the nose 2 (two) times daily as needed. For dryness    . XARELTO 20 MG TABS tablet TAKE 1 TABLET (20 MG TOTAL) BY MOUTH DAILY WITH SUPPER. 30 tablet 4   No current facility-administered medications for this visit.     Allergies:   Patient has no known allergies.   Social History:  The patient  reports that she has been smoking. She has been smoking about 1.00 pack per day. She has never  used smokeless tobacco. She reports current alcohol use. She reports that she does not use drugs.   Family History:  The patient's family history includes CAD in her father; Diabetes in her mother; Heart attack in her father; Hyperlipidemia in her father; Hypertension in her father; Liver cancer in her maternal grandmother.  ROS:  Please see the history of present illness.    All other systems are reviewed and otherwise negative.   PHYSICAL EXAM:  VS:  BP 134/72   Pulse (!) 59   Ht 5\' 8"  (1.727 m)   Wt 156 lb 6.4 oz (70.9 kg)   SpO2 99%   BMI 23.78 kg/m  BMI: Body mass index is 23.78 kg/m. Well nourished, well developed, in no acute distress  HEENT: normocephalic, atraumatic  Neck: no JVD, carotid bruits or masses Cardiac:  RRR; no significant murmurs, no rubs, or gallops Lungs:  CTA b/l, no wheezing, rhonchi or rales  Abd: soft, nontender MS: no deformity or atrophy Ext: no edema  Skin: warm and dry, no rash Neuro:  No gross deficits appreciated Psych: euthymic mood, full affect     EKG:  Done today and reviewed by myself shows SB, 59, PR 133ms, QRS 53ms  4/29/219: exercise stress test, walked 72min30sec, reaching 96%MPHR  Nuclear stress EF: 60%.  There was no ST segment deviation noted during stress.  The study is normal.  This is a low risk study.  The left ventricular ejection fraction is normal (55-65%).  Low risk stress nuclear study with normal perfusion and normal left ventricular regional and global systolic function.    04/19/12: TTE Study Conclusions - Left ventricle: The cavity size was normal. Wall thickness  was increased in a pattern of mild LVH. Systolic function  was normal. The estimated ejection fraction was in the  range of 55% to 60%. Wall motion was normal; there were no  regional wall motion abnormalities.  - Mitral valve: Mild regurgitation.  - Pericardium, extracardiac: A trivial pericardial effusion  was identified.      Recent Labs: No results found for requested labs within last 8760 hours.  No results found for requested labs within last 8760 hours.   CrCl cannot be calculated (Patient's most recent lab result is older than the maximum 21 days allowed.).   Wt Readings from Last 3 Encounters:  01/08/19 156 lb 6.4 oz (70.9 kg)  12/14/17 151 lb 9.6 oz (68.8 kg)  07/11/17 159 lb (72.1 kg)     Other studies reviewed: Additional studies/records reviewed today include: summarized above  ASSESSMENT AND PLAN:  1. Paroxysmal Afib     CHA2DS2Vasc is 1 (2 with  female), on xarelto     S/p PVI ablation 2014     Maintained on flecainide and metoprolol     Stable intervals  She is very pleased with her rhythm control currently Discussed ETOH./smoking today at length [facility] 12/13/18 Creat 0.650 (calc CrCl using today's weight is 100)  2. HTN     Looks ok, no changes    Disposition: F/u with [place] in 1 year, sooner if needed  Current medicines are reviewed at length with the patient today.  The patient did not have any concerns regarding medicines.  Venetia Night, PA-C 01/08/2019 3:45 PM     [address] [place] [address] [phone] (office)  [phone] (fax)

## 2019-01-08 ENCOUNTER — Other Ambulatory Visit: Payer: Self-pay

## 2019-01-08 ENCOUNTER — Encounter: Payer: Self-pay | Admitting: Physician Assistant

## 2019-01-08 ENCOUNTER — Ambulatory Visit: Payer: BC Managed Care – PPO | Admitting: Physician Assistant

## 2019-01-08 VITALS — BP 134/72 | HR 59 | Ht 68.0 in | Wt 156.4 lb

## 2019-01-08 DIAGNOSIS — I480 Paroxysmal atrial fibrillation: Secondary | ICD-10-CM | POA: Diagnosis not present

## 2019-01-08 DIAGNOSIS — I10 Essential (primary) hypertension: Secondary | ICD-10-CM

## 2019-01-08 NOTE — Patient Instructions (Signed)
Medication Instructions:   Your physician recommends that you continue on your current medications as directed. Please refer to the Current Medication list given to you today.  *If you need a refill on your cardiac medications before your next appointment, please call your pharmacy*  Lab Work:  None ordered today  Testing/Procedures:  None ordered today  Follow-Up: At [facility], you and your health needs are our priority.  As part of our continuing mission to provide you with exceptional heart care, we have created designated Provider Care Teams.  These Care Teams include your primary Cardiologist (physician) and Advanced Practice Providers (APPs -  Physician Assistants and Nurse Practitioners) who all work together to provide you with the care you need, when you need it.  Your next appointment:   12 months  The format for your next appointment:   In Person  Provider:   You may see Thompson Grayer, MD or one of the following Advanced Practice Providers on your designated Care Team:    Chanetta [facility], NP  Tommye Standard, PA-C  Legrand Como "[place]" [place], [place]

## 2019-01-12 ENCOUNTER — Telehealth: Payer: Self-pay

## 2019-01-12 MED ORDER — FLECAINIDE ACETATE 100 MG PO TABS: 100.0000 mg | ORAL_TABLET | Freq: Two times a day (BID) | ORAL | 3 refills | Status: DC

## 2019-01-12 NOTE — Telephone Encounter (Signed)
RX for flecainide sent to pharmacy.

## 2019-01-15 DIAGNOSIS — E7800 Pure hypercholesterolemia, unspecified: Secondary | ICD-10-CM | POA: Diagnosis not present

## 2019-02-14 ENCOUNTER — Other Ambulatory Visit: Payer: Self-pay | Admitting: Internal Medicine

## 2019-02-14 NOTE — Telephone Encounter (Signed)
Xarelto 20mg  refill request received. Pt is 63 years old, weight- 70.9kg, Crea-0.65 on 12/13/2018 via scanned labs from [facility] PCP on 12/13/2018, last seen by Tommye Standard on 01/08/2019, Diagnosis-Afib, CrCl-100.70ml/min; Dose is appropriate based on dosing criteria. Will send in refill to requested pharmacy.

## 2019-04-05 DIAGNOSIS — Z1231 Encounter for screening mammogram for malignant neoplasm of breast: Secondary | ICD-10-CM | POA: Diagnosis not present

## 2019-04-05 DIAGNOSIS — R928 Other abnormal and inconclusive findings on diagnostic imaging of breast: Secondary | ICD-10-CM | POA: Diagnosis not present

## 2019-05-14 DIAGNOSIS — R922 Inconclusive mammogram: Secondary | ICD-10-CM | POA: Diagnosis not present

## 2019-05-14 DIAGNOSIS — R928 Other abnormal and inconclusive findings on diagnostic imaging of breast: Secondary | ICD-10-CM | POA: Diagnosis not present

## 2019-06-13 ENCOUNTER — Other Ambulatory Visit: Payer: Self-pay | Admitting: Internal Medicine

## 2019-07-02 ENCOUNTER — Other Ambulatory Visit: Payer: Self-pay | Admitting: Internal Medicine

## 2019-11-11 ENCOUNTER — Other Ambulatory Visit: Payer: Self-pay | Admitting: Internal Medicine

## 2019-12-03 ENCOUNTER — Other Ambulatory Visit: Payer: Self-pay | Admitting: Family Medicine

## 2019-12-03 ENCOUNTER — Other Ambulatory Visit (HOSPITAL_COMMUNITY)
Admission: RE | Admit: 2019-12-03 | Discharge: 2019-12-03 | Disposition: A | Discharge status: Home / Self Care | Payer: BC Managed Care – PPO | Source: Ambulatory Visit | Attending: Family Medicine | Admitting: Family Medicine

## 2019-12-03 DIAGNOSIS — E7800 Pure hypercholesterolemia, unspecified: Secondary | ICD-10-CM | POA: Diagnosis not present

## 2019-12-03 DIAGNOSIS — R7303 Prediabetes: Secondary | ICD-10-CM | POA: Diagnosis not present

## 2019-12-03 DIAGNOSIS — Z0000 Encounter for general adult medical examination without abnormal findings: Secondary | ICD-10-CM | POA: Diagnosis not present

## 2019-12-03 DIAGNOSIS — Z124 Encounter for screening for malignant neoplasm of cervix: Secondary | ICD-10-CM | POA: Insufficient documentation

## 2019-12-03 DIAGNOSIS — Z23 Encounter for immunization: Secondary | ICD-10-CM | POA: Diagnosis not present

## 2019-12-05 LAB — CYTOLOGY - PAP: Diagnosis: NEGATIVE

## 2019-12-20 ENCOUNTER — Other Ambulatory Visit: Payer: Self-pay | Admitting: Internal Medicine

## 2019-12-20 NOTE — Telephone Encounter (Signed)
Prescription refill request for Xarelto received.   Last office visit: 01/08/2019, Kellie Reynolds Weight:70.9 kg  Age: 64 y.o. Scr: 0.66, 12/03/2019 CrCl: 97 ml/min   Prescription refill sent.

## 2019-12-23 ENCOUNTER — Other Ambulatory Visit: Payer: Self-pay | Admitting: Internal Medicine

## 2019-12-29 ENCOUNTER — Other Ambulatory Visit: Payer: Self-pay | Admitting: Internal Medicine

## 2020-01-20 ENCOUNTER — Other Ambulatory Visit: Payer: Self-pay | Admitting: Internal Medicine

## 2020-01-31 ENCOUNTER — Ambulatory Visit: Payer: BC Managed Care – PPO | Admitting: Physician Assistant

## 2020-01-31 ENCOUNTER — Other Ambulatory Visit: Payer: Self-pay

## 2020-01-31 ENCOUNTER — Encounter: Payer: Self-pay | Admitting: Physician Assistant

## 2020-01-31 ENCOUNTER — Telehealth: Payer: Self-pay | Admitting: *Deleted

## 2020-01-31 VITALS — BP 118/64 | HR 64 | Ht 68.0 in | Wt 167.4 lb

## 2020-01-31 DIAGNOSIS — I10 Essential (primary) hypertension: Secondary | ICD-10-CM

## 2020-01-31 DIAGNOSIS — G4730 Sleep apnea, unspecified: Secondary | ICD-10-CM

## 2020-01-31 DIAGNOSIS — I480 Paroxysmal atrial fibrillation: Secondary | ICD-10-CM | POA: Diagnosis not present

## 2020-01-31 DIAGNOSIS — Z79899 Other long term (current) drug therapy: Secondary | ICD-10-CM

## 2020-01-31 DIAGNOSIS — Z5181 Encounter for therapeutic drug level monitoring: Secondary | ICD-10-CM

## 2020-01-31 NOTE — Telephone Encounter (Signed)
-----   Message from Claude Manges, [place] sent at 01/31/2020  4:07 PM EST ----- Regarding: PATIENT  NEEDS SLEEP STUDY AND RESTABLISH WITH DR TURNER FOR SLEEP

## 2020-01-31 NOTE — Patient Instructions (Signed)
Medication Instructions:   Your physician recommends that you continue on your current medications as directed. Please refer to the Current Medication list given to you today.   *If you need a refill on your cardiac medications before your next appointment, please call your pharmacy*   Lab Work: [place]   If you have labs (blood work) drawn today and your tests are completely normal, you will receive your results only by: Marland Kitchen MyChart Message (if you have MyChart) OR . A paper copy in the mail If you have any lab test that is abnormal or we need to change your treatment, we will call you to review the results.   Testing/Procedures: Your physician has recommended that you have a sleep study. This test records several body functions during sleep, including: brain activity, eye movement, oxygen and carbon dioxide blood levels, heart rate and rhythm, breathing rate and rhythm, the flow of air through your mouth and nose, snoring, body muscle movements, and chest and belly movement.    Follow-Up: At [facility], you and your health needs are our priority.  As part of our continuing mission to provide you with exceptional heart care, we have created designated Provider Care Teams.  These Care Teams include your primary Cardiologist (physician) and Advanced Practice Providers (APPs -  Physician Assistants and Nurse Practitioners) who all work together to provide you with the care you need, when you need it.  We recommend signing up for the patient portal called "MyChart".  Sign up information is provided on this After Visit Summary.  MyChart is used to connect with patients for Virtual Visits (Telemedicine).  Patients are able to view lab/test results, encounter notes, upcoming appointments, etc.  Non-urgent messages can be sent to your provider as well.   To learn more about what you can do with MyChart, go to [email or web address].    Your next appointment:   1 year(s)  The  format for your next appointment:   In Person  Provider:   You may see Dr. Rayann Heman. or one of the following Advanced Practice Providers on your designated Care Team:     Other Instructions

## 2020-01-31 NOTE — Progress Notes (Signed)
Cardiology Office Note Date:  01/31/2020  Patient ID:  Kellie Reynolds 1955-04-29, MRN [id number] PCP:  Kellie Graff.Marlou Sa, MD  Electrophysiologist:  Dr. Rayann Reynolds     Chief Complaint: annual EP visit  History of Present Illness: Kellie Reynolds is a 64 y.o. female with history of GERD, HTN, HLD, MVP, ongoing smoker, and PAFib s/p PVI ablation 2014.  She comes in today to be seen for Dr. Rayann Reynolds.   12/2017. She was seen with rare palpitations and previously discussed loop not pursued.She was doing well, discussed with new guidelines, could consider stopping her [place] though the patient wanted to stay on her Xarelto.  No changes were made and she was planned for 6 mo visit. She had a virtual visit with Dr. Rayann Reynolds March 2020, was doing well, no changes were made.  Discussed ILR if palpitations were to worsen   I saw her Oct 2020 She is doing well.  She says she takes her Flecainide daily and only a second dose if needed.  This she says in d/w Dr. Rayann Reynolds in attempt to reduce or eliminate medicines.   She mentions taking a second dose at most 2x a month only and is very happy with her AFib management  She has had a couple high L chest sharp/fleeting pains that shoot up the side of her neck.  This lasts only a second.  No SOB, no dizzy spells, no near syncope or syncope. She denies bleeding or signs of bleeding. She mentions that she had over the summer worsening mood stability and increased her smoking and drinking , in d/w her PMD started on Citalopram and this has done wonders for her.  She feels better, less agitated and has cut back on her smoking and drinking.  We discussed the importance of smokking cessation and reduction of ETOH, no changes were made to hr meds, planned for an annual visit.  TODAY She continues to do well.  She continues to work full time and cares for her 2 y/o grandchild evenings! She remains happy with her AFib burden, infrequently feels a fleeting flip flop,  infrequently palpitations that last. No dizzy spells, near syncope or syncope, no CP, SOB or DOE. No bleeding or signs of bleeding  She has a known diagnosis of sleep apnea, historically with CPAP but has been years since she has worn it and fells like she is tired all of the time, never really feels rested. She was historically followed by Dr. Radford Reynolds for this but has let this fall away. She wouldlike to get re-evaluated and back in therapy if needed.   Past Medical History:  Diagnosis Date  . Arthritis    right knee  . Family history of adverse reaction to anesthesia 2 yrs ago   daughter had malignant hyperthermia reaction with acentine, daughter has had surgeries since then and did welll  . Fibrocystic breast disease   . GERD (gastroesophageal reflux disease)   . HTN (hypertension)   . Hypercholesteremia   . MVP (mitral valve prolapse)   . Paroxysmal atrial fibrillation (HCC)    a. s/p PVI  . Skin cancer   . Sleep apnea    uses cpap 4 nights per week, pt does not knoe settings    Past Surgical History:  Procedure Laterality Date  . ATRIAL FIBRILLATION ABLATION N/A 04/18/2012   PVI by Allred  . COLONOSCOPY WITH PROPOFOL N/A 11/24/2015   Procedure: COLONOSCOPY WITH PROPOFOL;  Surgeon: Garlan Fair, MD;  Location: [facility];  Service: Endoscopy;  Laterality: N/A;  . EYE SURGERY Bilateral    lasix eye surgery both eyes  . MENISECTOMY     Right knee  . NASAL POLYP SURGERY  1990  . NASAL SINUS SURGERY  1990  . TEE WITHOUT CARDIOVERSION  04/17/2012   Procedure: TRANSESOPHAGEAL ECHOCARDIOGRAM (TEE);  Surgeon: Jettie Booze, MD;  Location: [facility];  Service: Cardiovascular;  Laterality: N/A;  pre ablation  . TUBAL LIGATION      Current Outpatient Medications  Medication Sig Dispense Refill  . acetaminophen (TYLENOL) 500 MG tablet Take 500 mg by mouth every 6 (six) hours as needed.    Marland Kitchen atorvastatin (LIPITOR) 20 MG tablet Take 20 mg by mouth daily.    .  citalopram (CELEXA) 10 MG tablet Take 10 mg by mouth daily.    . flecainide (TAMBOCOR) 100 MG tablet Take 50 mg by mouth 2 (two) times daily.    Marland Kitchen losartan (COZAAR) 50 MG tablet Take 50 mg by mouth daily.    . metoprolol tartrate (LOPRESSOR) 50 MG tablet TAKE 0.5 TABLETS BY MOUTH 2 TIMES DAILY. PLEASE MAKE YEARLY APPT WITH DR 90 tablet 0  . sodium chloride (OCEAN) 0.65 % nasal spray Place 1 spray into the nose 2 (two) times daily as needed. For dryness    . XARELTO 20 MG TABS tablet TAKE 1 TABLET (20 MG TOTAL) BY MOUTH DAILY WITH SUPPER. 30 tablet 5   No current facility-administered medications for this visit.    Allergies:   Patient has no known allergies.   Social History:  The patient  reports that she has been smoking. She has been smoking about 1.00 pack per day. She has never used smokeless tobacco. She reports current alcohol use. She reports that she does not use drugs.   Family History:  The patient's family history includes CAD in her father; Diabetes in her mother; Heart attack in her father; Hyperlipidemia in her father; Hypertension in her father; Liver cancer in her maternal grandmother.  ROS:  Please see the history of present illness.    All other systems are reviewed and otherwise negative.   PHYSICAL EXAM:  VS:  BP 118/64   Pulse 64   Ht 5\' 8"  (1.727 m)   Wt 167 lb 6.4 oz (75.9 kg)   SpO2 98%   BMI 25.45 kg/m  BMI: Body mass index is 25.45 kg/m. Well nourished, well developed, in no acute distress  HEENT: normocephalic, atraumatic  Neck: no JVD, carotid bruits or masses Cardiac:  RRR; no significant murmurs, no rubs, or gallops Lungs:  CTA b/l, no wheezing, rhonchi or rales  Abd: soft, nontender MS: no deformity or atrophy Ext: no edema  Skin: warm and dry, no rash Neuro:  No gross deficits appreciated Psych: euthymic mood, full affect     EKG:  Done today and reviewed by myself shows  SR 64bpm, stable intervals   4/29/219: exercise stress test,  walked 50min30sec, reaching 96%MPHR  Nuclear stress EF: 60%.  There was no ST segment deviation noted during stress.  The study is normal.  This is a low risk study.  The left ventricular ejection fraction is normal (55-65%).  Low risk stress nuclear study with normal perfusion and normal left ventricular regional and global systolic function.    04/19/12: TTE Study Conclusions - Left ventricle: The cavity size was normal. Wall thickness  was increased in a pattern of mild LVH. Systolic function  was normal. The estimated ejection fraction  was in the  range of 55% to 60%. Wall motion was normal; there were no  regional wall motion abnormalities.  - Mitral valve: Mild regurgitation.  - Pericardium, extracardiac: A trivial pericardial effusion  was identified.     Recent Labs: No results found for requested labs within last 8760 hours.  No results found for requested labs within last 8760 hours.   CrCl cannot be calculated (Patient's most recent lab result is older than the maximum 21 days allowed.).   Wt Readings from Last 3 Encounters:  01/31/20 167 lb 6.4 oz (75.9 kg)  01/08/19 156 lb 6.4 oz (70.9 kg)  12/14/17 151 lb 9.6 oz (68.8 kg)     Other studies reviewed: Additional studies/records reviewed today include: summarized above  ASSESSMENT AND PLAN:  1. Paroxysmal Afib     CHA2DS2Vasc is 1 (2 with female), on xarelto, appropriately dosed     S/p PVI ablation 2014     Maintained on flecainide and metoprolol     Stable intervals  She remains pleased with her rhythm control currently [facility], 12/03/19 Creat 0.66 Hgb 12.9  2. HTN     Looks ok, no changes  3. Untreated sleep apnea     Fatigued     Will order sleep study with Dr. Radford Reynolds, to re-establish with her/care    Disposition: will continue to follow her annually   Current medicines are reviewed at length with the patient today.  The patient did not have any concerns regarding  medicines.  Venetia Night, PA-C 01/31/2020 5:37 PM     [address] [place] [address] [phone] (office)  [phone] (fax)

## 2020-02-04 ENCOUNTER — Telehealth: Payer: Self-pay | Admitting: Cardiology

## 2020-02-04 NOTE — Telephone Encounter (Signed)
No PA required per [facility] #[phone].  Waiting on sleep lab to call back to schedule.

## 2020-02-04 NOTE — Telephone Encounter (Signed)
Called and lm for patient that she is scheduled for Monday, Jan. 3 at 8 pm, to expect info packet and left the sleep lab #.

## 2020-02-21 ENCOUNTER — Other Ambulatory Visit: Payer: Self-pay | Admitting: Internal Medicine

## 2020-03-11 DIAGNOSIS — U071 COVID-19: Secondary | ICD-10-CM | POA: Diagnosis not present

## 2020-03-11 DIAGNOSIS — Z20828 Contact with and (suspected) exposure to other viral communicable diseases: Secondary | ICD-10-CM | POA: Diagnosis not present

## 2020-03-15 DIAGNOSIS — C50919 Malignant neoplasm of unspecified site of unspecified female breast: Secondary | ICD-10-CM

## 2020-03-15 HISTORY — DX: Malignant neoplasm of unspecified site of unspecified female breast: C50.919

## 2020-03-15 HISTORY — PX: BREAST LUMPECTOMY: SHX2

## 2020-03-17 ENCOUNTER — Encounter (HOSPITAL_BASED_OUTPATIENT_CLINIC_OR_DEPARTMENT_OTHER): Payer: BC Managed Care – PPO | Admitting: Cardiology

## 2020-06-06 DIAGNOSIS — N6489 Other specified disorders of breast: Secondary | ICD-10-CM | POA: Diagnosis not present

## 2020-06-06 DIAGNOSIS — Z1231 Encounter for screening mammogram for malignant neoplasm of breast: Secondary | ICD-10-CM | POA: Diagnosis not present

## 2020-06-16 ENCOUNTER — Other Ambulatory Visit: Payer: Self-pay

## 2020-06-16 ENCOUNTER — Ambulatory Visit (HOSPITAL_BASED_OUTPATIENT_CLINIC_OR_DEPARTMENT_OTHER): Payer: BC Managed Care – PPO | Attending: Physician Assistant | Admitting: Cardiology

## 2020-06-16 DIAGNOSIS — I480 Paroxysmal atrial fibrillation: Secondary | ICD-10-CM | POA: Diagnosis not present

## 2020-06-16 DIAGNOSIS — G4730 Sleep apnea, unspecified: Secondary | ICD-10-CM | POA: Diagnosis not present

## 2020-06-17 DIAGNOSIS — R922 Inconclusive mammogram: Secondary | ICD-10-CM | POA: Diagnosis not present

## 2020-06-17 DIAGNOSIS — R928 Other abnormal and inconclusive findings on diagnostic imaging of breast: Secondary | ICD-10-CM | POA: Diagnosis not present

## 2020-06-17 NOTE — Procedures (Signed)
   Patient Name: Tumeka, Chimenti Date: 06/16/2020 Gender: Female D.O.B: [birth year] Age (years): 64 Referring Provider: Brien Few Charlcie Cradle PA-C Height (inches): 26 Interpreting Physician: Fransico Him MD, ABSM Weight (lbs): 165 RPSGT: Gwenyth Allegra BMI: 25 MRN: [id number] Neck Size: 15.00  CLINICAL INFORMATION Sleep Study Type: NPSG  Indication for sleep study: Hypertension, OSA, Snoring  Epworth Sleepiness Score: 9  SLEEP STUDY TECHNIQUE As per the AASM Manual for the Scoring of Sleep and Associated Events v2.3 (April 2016) with a hypopnea requiring 4% desaturations.  The channels recorded and monitored were frontal, central and occipital EEG, electrooculogram (EOG), submentalis EMG (chin), nasal and oral airflow, thoracic and abdominal wall motion, anterior tibialis EMG, snore microphone, electrocardiogram, and pulse oximetry.  MEDICATIONS Medications self-administered by patient taken the night of the study : TYLENOL PM  SLEEP ARCHITECTURE The study was initiated at 10:48:29 PM and ended at 5:00:20 AM.  Sleep onset time was 32.4 minutes and the sleep efficiency was 64.3%. The total sleep time was 239 minutes.  Stage REM latency was 123.0 minutes.  The patient spent 9.0% of the night in stage N1 sleep, 69.0% in stage N2 sleep, 0.0% in stage N3 and 22% in REM.  Alpha intrusion was absent.  Supine sleep was 31.59%.  RESPIRATORY PARAMETERS The overall apnea/hypopnea index (AHI) was 0.5 per hour. There were 0 total apneas, including 0 obstructive, 0 central and 0 mixed apneas. There were 2 hypopneas and 10 RERAs.  The AHI during Stage REM sleep was 1.1 per hour.  AHI while supine was 0.0 per hour.  The mean oxygen saturation was 92.2%. The minimum SpO2 during sleep was 88.0%.  soft snoring was noted during this study.  CARDIAC DATA The 2 lead EKG demonstrated sinus rhythm. The mean heart rate was 61.3 beats per minute. Other EKG findings include: None.  LEG  MOVEMENT DATA The total PLMS were 0 with a resulting PLMS index of 0.0. Associated arousal with leg movement index was 5.8 .  IMPRESSIONS - No significant obstructive sleep apnea occurred during this study (AHI = 0.5/h). - The patient had minimal or no oxygen desaturation during the study (Min O2 = 88.0%) - The patient snored with soft snoring volume. - No cardiac abnormalities were noted during this study. - Clinically significant periodic limb movements did not occur during sleep. Associated arousals were significant.  DIAGNOSIS Normal Study  RECOMMENDATIONS - Avoid alcohol, sedatives and other CNS depressants that may worsen sleep apnea and disrupt normal sleep architecture. - Sleep hygiene should be reviewed to assess factors that may improve sleep quality. - Weight management and regular exercise should be initiated or continued if appropriate.  [Electronically signed] 06/17/2020 09:51 PM  Fransico Him MD, ABSM Diplomate, American Board of Sleep Medicine

## 2020-06-25 ENCOUNTER — Telehealth: Payer: Self-pay | Admitting: *Deleted

## 2020-06-25 NOTE — Telephone Encounter (Signed)
Informed patient of sleep study results and patient understanding was verbalized. Patient understands her sleep study showed no significant sleep apnea.    Left detailed message on voicemail and informed patient to call back with questions

## 2020-06-25 NOTE — Telephone Encounter (Signed)
-----   Message from Sueanne Margarita, MD sent at 06/17/2020  9:54 PM EDT ----- Please let patient know that sleep study showed no significant sleep apnea.

## 2020-06-26 ENCOUNTER — Other Ambulatory Visit: Payer: Self-pay | Admitting: Internal Medicine

## 2020-06-27 NOTE — Telephone Encounter (Signed)
Pt last saw Kellie Reynolds, [place] on 01/31/20, last labs 12/03/19 Creat 0.66, age 65, weight 74.8kg, CrCl 101.69, based on CrCl pt is on appropriate dosage of Xarelto 20mg  QD.  Will refill rx.

## 2020-07-01 DIAGNOSIS — N6321 Unspecified lump in the left breast, upper outer quadrant: Secondary | ICD-10-CM | POA: Diagnosis not present

## 2020-07-01 DIAGNOSIS — R928 Other abnormal and inconclusive findings on diagnostic imaging of breast: Secondary | ICD-10-CM | POA: Diagnosis not present

## 2020-07-01 DIAGNOSIS — N6489 Other specified disorders of breast: Secondary | ICD-10-CM | POA: Diagnosis not present

## 2020-07-01 DIAGNOSIS — N6032 Fibrosclerosis of left breast: Secondary | ICD-10-CM | POA: Diagnosis not present

## 2020-07-14 DIAGNOSIS — I4891 Unspecified atrial fibrillation: Secondary | ICD-10-CM | POA: Diagnosis not present

## 2020-07-14 DIAGNOSIS — Z7901 Long term (current) use of anticoagulants: Secondary | ICD-10-CM | POA: Diagnosis not present

## 2020-07-14 DIAGNOSIS — R928 Other abnormal and inconclusive findings on diagnostic imaging of breast: Secondary | ICD-10-CM | POA: Diagnosis not present

## 2020-07-14 DIAGNOSIS — F17210 Nicotine dependence, cigarettes, uncomplicated: Secondary | ICD-10-CM | POA: Diagnosis not present

## 2020-07-29 DIAGNOSIS — I4891 Unspecified atrial fibrillation: Secondary | ICD-10-CM | POA: Diagnosis not present

## 2020-07-29 DIAGNOSIS — R928 Other abnormal and inconclusive findings on diagnostic imaging of breast: Secondary | ICD-10-CM | POA: Diagnosis not present

## 2020-07-29 DIAGNOSIS — C50812 Malignant neoplasm of overlapping sites of left female breast: Secondary | ICD-10-CM | POA: Diagnosis not present

## 2020-07-29 DIAGNOSIS — N6032 Fibrosclerosis of left breast: Secondary | ICD-10-CM | POA: Diagnosis not present

## 2020-07-29 DIAGNOSIS — F17200 Nicotine dependence, unspecified, uncomplicated: Secondary | ICD-10-CM | POA: Diagnosis not present

## 2020-07-29 DIAGNOSIS — N6489 Other specified disorders of breast: Secondary | ICD-10-CM | POA: Diagnosis not present

## 2020-07-29 DIAGNOSIS — N6022 Fibroadenosis of left breast: Secondary | ICD-10-CM | POA: Diagnosis not present

## 2020-07-29 DIAGNOSIS — R920 Mammographic microcalcification found on diagnostic imaging of breast: Secondary | ICD-10-CM | POA: Diagnosis not present

## 2020-07-29 DIAGNOSIS — I10 Essential (primary) hypertension: Secondary | ICD-10-CM | POA: Diagnosis not present

## 2020-07-29 DIAGNOSIS — C50912 Malignant neoplasm of unspecified site of left female breast: Secondary | ICD-10-CM | POA: Diagnosis not present

## 2020-07-29 DIAGNOSIS — F17210 Nicotine dependence, cigarettes, uncomplicated: Secondary | ICD-10-CM | POA: Diagnosis not present

## 2020-07-29 DIAGNOSIS — Z7901 Long term (current) use of anticoagulants: Secondary | ICD-10-CM | POA: Diagnosis not present

## 2020-08-14 DIAGNOSIS — Z170 Estrogen receptor positive status [ER+]: Secondary | ICD-10-CM | POA: Insufficient documentation

## 2020-08-14 DIAGNOSIS — C50412 Malignant neoplasm of upper-outer quadrant of left female breast: Secondary | ICD-10-CM | POA: Insufficient documentation

## 2020-09-02 DIAGNOSIS — C50912 Malignant neoplasm of unspecified site of left female breast: Secondary | ICD-10-CM | POA: Diagnosis not present

## 2020-09-02 DIAGNOSIS — C50412 Malignant neoplasm of upper-outer quadrant of left female breast: Secondary | ICD-10-CM | POA: Diagnosis not present

## 2020-09-02 DIAGNOSIS — Z170 Estrogen receptor positive status [ER+]: Secondary | ICD-10-CM | POA: Diagnosis not present

## 2020-09-03 DIAGNOSIS — C50819 Malignant neoplasm of overlapping sites of unspecified female breast: Secondary | ICD-10-CM | POA: Diagnosis not present

## 2020-09-03 DIAGNOSIS — N6489 Other specified disorders of breast: Secondary | ICD-10-CM | POA: Diagnosis not present

## 2020-09-03 DIAGNOSIS — C50412 Malignant neoplasm of upper-outer quadrant of left female breast: Secondary | ICD-10-CM | POA: Diagnosis not present

## 2020-09-03 DIAGNOSIS — Z170 Estrogen receptor positive status [ER+]: Secondary | ICD-10-CM | POA: Diagnosis not present

## 2020-09-04 DIAGNOSIS — Z170 Estrogen receptor positive status [ER+]: Secondary | ICD-10-CM | POA: Diagnosis not present

## 2020-09-04 DIAGNOSIS — N6082 Other benign mammary dysplasias of left breast: Secondary | ICD-10-CM | POA: Diagnosis not present

## 2020-09-04 DIAGNOSIS — F17210 Nicotine dependence, cigarettes, uncomplicated: Secondary | ICD-10-CM | POA: Diagnosis not present

## 2020-09-04 DIAGNOSIS — N6489 Other specified disorders of breast: Secondary | ICD-10-CM | POA: Diagnosis not present

## 2020-09-04 DIAGNOSIS — Z7901 Long term (current) use of anticoagulants: Secondary | ICD-10-CM | POA: Diagnosis not present

## 2020-09-04 DIAGNOSIS — N6022 Fibroadenosis of left breast: Secondary | ICD-10-CM | POA: Diagnosis not present

## 2020-09-04 DIAGNOSIS — C50412 Malignant neoplasm of upper-outer quadrant of left female breast: Secondary | ICD-10-CM | POA: Diagnosis not present

## 2020-09-29 DIAGNOSIS — Z170 Estrogen receptor positive status [ER+]: Secondary | ICD-10-CM | POA: Diagnosis not present

## 2020-09-29 DIAGNOSIS — C50412 Malignant neoplasm of upper-outer quadrant of left female breast: Secondary | ICD-10-CM | POA: Diagnosis not present

## 2020-09-29 DIAGNOSIS — C50112 Malignant neoplasm of central portion of left female breast: Secondary | ICD-10-CM | POA: Diagnosis not present

## 2020-10-01 DIAGNOSIS — C50412 Malignant neoplasm of upper-outer quadrant of left female breast: Secondary | ICD-10-CM | POA: Diagnosis not present

## 2020-10-01 DIAGNOSIS — Z170 Estrogen receptor positive status [ER+]: Secondary | ICD-10-CM | POA: Diagnosis not present

## 2020-10-03 DIAGNOSIS — C50412 Malignant neoplasm of upper-outer quadrant of left female breast: Secondary | ICD-10-CM | POA: Diagnosis not present

## 2020-10-03 DIAGNOSIS — C50912 Malignant neoplasm of unspecified site of left female breast: Secondary | ICD-10-CM | POA: Diagnosis not present

## 2020-10-03 DIAGNOSIS — Z170 Estrogen receptor positive status [ER+]: Secondary | ICD-10-CM | POA: Diagnosis not present

## 2020-10-03 DIAGNOSIS — Z510 Encounter for antineoplastic radiation therapy: Secondary | ICD-10-CM | POA: Diagnosis not present

## 2020-10-10 DIAGNOSIS — Z510 Encounter for antineoplastic radiation therapy: Secondary | ICD-10-CM | POA: Diagnosis not present

## 2020-10-10 DIAGNOSIS — C50412 Malignant neoplasm of upper-outer quadrant of left female breast: Secondary | ICD-10-CM | POA: Diagnosis not present

## 2020-10-10 DIAGNOSIS — C50912 Malignant neoplasm of unspecified site of left female breast: Secondary | ICD-10-CM | POA: Diagnosis not present

## 2020-10-10 DIAGNOSIS — Z170 Estrogen receptor positive status [ER+]: Secondary | ICD-10-CM | POA: Diagnosis not present

## 2020-10-14 DIAGNOSIS — Z170 Estrogen receptor positive status [ER+]: Secondary | ICD-10-CM | POA: Diagnosis not present

## 2020-10-14 DIAGNOSIS — Z510 Encounter for antineoplastic radiation therapy: Secondary | ICD-10-CM | POA: Diagnosis not present

## 2020-10-14 DIAGNOSIS — C50912 Malignant neoplasm of unspecified site of left female breast: Secondary | ICD-10-CM | POA: Diagnosis not present

## 2020-10-14 DIAGNOSIS — C50412 Malignant neoplasm of upper-outer quadrant of left female breast: Secondary | ICD-10-CM | POA: Diagnosis not present

## 2020-10-15 DIAGNOSIS — Z170 Estrogen receptor positive status [ER+]: Secondary | ICD-10-CM | POA: Diagnosis not present

## 2020-10-15 DIAGNOSIS — C50912 Malignant neoplasm of unspecified site of left female breast: Secondary | ICD-10-CM | POA: Diagnosis not present

## 2020-10-15 DIAGNOSIS — Z510 Encounter for antineoplastic radiation therapy: Secondary | ICD-10-CM | POA: Diagnosis not present

## 2020-10-15 DIAGNOSIS — C50412 Malignant neoplasm of upper-outer quadrant of left female breast: Secondary | ICD-10-CM | POA: Diagnosis not present

## 2020-10-16 DIAGNOSIS — C50912 Malignant neoplasm of unspecified site of left female breast: Secondary | ICD-10-CM | POA: Diagnosis not present

## 2020-10-16 DIAGNOSIS — C50412 Malignant neoplasm of upper-outer quadrant of left female breast: Secondary | ICD-10-CM | POA: Diagnosis not present

## 2020-10-16 DIAGNOSIS — Z170 Estrogen receptor positive status [ER+]: Secondary | ICD-10-CM | POA: Diagnosis not present

## 2020-10-16 DIAGNOSIS — Z510 Encounter for antineoplastic radiation therapy: Secondary | ICD-10-CM | POA: Diagnosis not present

## 2020-10-17 DIAGNOSIS — Z170 Estrogen receptor positive status [ER+]: Secondary | ICD-10-CM | POA: Diagnosis not present

## 2020-10-17 DIAGNOSIS — Z510 Encounter for antineoplastic radiation therapy: Secondary | ICD-10-CM | POA: Diagnosis not present

## 2020-10-17 DIAGNOSIS — C50412 Malignant neoplasm of upper-outer quadrant of left female breast: Secondary | ICD-10-CM | POA: Diagnosis not present

## 2020-10-20 DIAGNOSIS — M7062 Trochanteric bursitis, left hip: Secondary | ICD-10-CM | POA: Diagnosis not present

## 2020-10-20 DIAGNOSIS — Z510 Encounter for antineoplastic radiation therapy: Secondary | ICD-10-CM | POA: Diagnosis not present

## 2020-10-20 DIAGNOSIS — C50412 Malignant neoplasm of upper-outer quadrant of left female breast: Secondary | ICD-10-CM | POA: Diagnosis not present

## 2020-10-20 DIAGNOSIS — Z170 Estrogen receptor positive status [ER+]: Secondary | ICD-10-CM | POA: Diagnosis not present

## 2020-10-20 DIAGNOSIS — M25552 Pain in left hip: Secondary | ICD-10-CM | POA: Diagnosis not present

## 2020-10-21 DIAGNOSIS — C50412 Malignant neoplasm of upper-outer quadrant of left female breast: Secondary | ICD-10-CM | POA: Diagnosis not present

## 2020-10-21 DIAGNOSIS — Z170 Estrogen receptor positive status [ER+]: Secondary | ICD-10-CM | POA: Diagnosis not present

## 2020-10-21 DIAGNOSIS — Z510 Encounter for antineoplastic radiation therapy: Secondary | ICD-10-CM | POA: Diagnosis not present

## 2020-10-22 DIAGNOSIS — Z510 Encounter for antineoplastic radiation therapy: Secondary | ICD-10-CM | POA: Diagnosis not present

## 2020-10-22 DIAGNOSIS — C50912 Malignant neoplasm of unspecified site of left female breast: Secondary | ICD-10-CM | POA: Diagnosis not present

## 2020-10-22 DIAGNOSIS — C50412 Malignant neoplasm of upper-outer quadrant of left female breast: Secondary | ICD-10-CM | POA: Diagnosis not present

## 2020-10-22 DIAGNOSIS — Z170 Estrogen receptor positive status [ER+]: Secondary | ICD-10-CM | POA: Diagnosis not present

## 2020-10-23 DIAGNOSIS — C50412 Malignant neoplasm of upper-outer quadrant of left female breast: Secondary | ICD-10-CM | POA: Diagnosis not present

## 2020-10-23 DIAGNOSIS — Z510 Encounter for antineoplastic radiation therapy: Secondary | ICD-10-CM | POA: Diagnosis not present

## 2020-10-23 DIAGNOSIS — Z170 Estrogen receptor positive status [ER+]: Secondary | ICD-10-CM | POA: Diagnosis not present

## 2020-10-24 DIAGNOSIS — Z170 Estrogen receptor positive status [ER+]: Secondary | ICD-10-CM | POA: Diagnosis not present

## 2020-10-24 DIAGNOSIS — C50412 Malignant neoplasm of upper-outer quadrant of left female breast: Secondary | ICD-10-CM | POA: Diagnosis not present

## 2020-10-24 DIAGNOSIS — Z510 Encounter for antineoplastic radiation therapy: Secondary | ICD-10-CM | POA: Diagnosis not present

## 2020-10-27 DIAGNOSIS — Z170 Estrogen receptor positive status [ER+]: Secondary | ICD-10-CM | POA: Diagnosis not present

## 2020-10-27 DIAGNOSIS — Z510 Encounter for antineoplastic radiation therapy: Secondary | ICD-10-CM | POA: Diagnosis not present

## 2020-10-27 DIAGNOSIS — C50412 Malignant neoplasm of upper-outer quadrant of left female breast: Secondary | ICD-10-CM | POA: Diagnosis not present

## 2020-10-28 DIAGNOSIS — C50412 Malignant neoplasm of upper-outer quadrant of left female breast: Secondary | ICD-10-CM | POA: Diagnosis not present

## 2020-10-28 DIAGNOSIS — Z170 Estrogen receptor positive status [ER+]: Secondary | ICD-10-CM | POA: Diagnosis not present

## 2020-10-28 DIAGNOSIS — Z510 Encounter for antineoplastic radiation therapy: Secondary | ICD-10-CM | POA: Diagnosis not present

## 2020-10-29 DIAGNOSIS — C50412 Malignant neoplasm of upper-outer quadrant of left female breast: Secondary | ICD-10-CM | POA: Diagnosis not present

## 2020-10-29 DIAGNOSIS — Z170 Estrogen receptor positive status [ER+]: Secondary | ICD-10-CM | POA: Diagnosis not present

## 2020-10-29 DIAGNOSIS — C50912 Malignant neoplasm of unspecified site of left female breast: Secondary | ICD-10-CM | POA: Diagnosis not present

## 2020-10-29 DIAGNOSIS — Z510 Encounter for antineoplastic radiation therapy: Secondary | ICD-10-CM | POA: Diagnosis not present

## 2020-10-30 DIAGNOSIS — Z510 Encounter for antineoplastic radiation therapy: Secondary | ICD-10-CM | POA: Diagnosis not present

## 2020-10-30 DIAGNOSIS — Z170 Estrogen receptor positive status [ER+]: Secondary | ICD-10-CM | POA: Diagnosis not present

## 2020-10-30 DIAGNOSIS — C50412 Malignant neoplasm of upper-outer quadrant of left female breast: Secondary | ICD-10-CM | POA: Diagnosis not present

## 2020-10-31 DIAGNOSIS — Z170 Estrogen receptor positive status [ER+]: Secondary | ICD-10-CM | POA: Diagnosis not present

## 2020-10-31 DIAGNOSIS — Z510 Encounter for antineoplastic radiation therapy: Secondary | ICD-10-CM | POA: Diagnosis not present

## 2020-10-31 DIAGNOSIS — C50412 Malignant neoplasm of upper-outer quadrant of left female breast: Secondary | ICD-10-CM | POA: Diagnosis not present

## 2020-11-02 DIAGNOSIS — Z510 Encounter for antineoplastic radiation therapy: Secondary | ICD-10-CM | POA: Diagnosis not present

## 2020-11-02 DIAGNOSIS — C50412 Malignant neoplasm of upper-outer quadrant of left female breast: Secondary | ICD-10-CM | POA: Diagnosis not present

## 2020-11-02 DIAGNOSIS — C50912 Malignant neoplasm of unspecified site of left female breast: Secondary | ICD-10-CM | POA: Diagnosis not present

## 2020-11-02 DIAGNOSIS — Z170 Estrogen receptor positive status [ER+]: Secondary | ICD-10-CM | POA: Diagnosis not present

## 2020-11-03 DIAGNOSIS — Z510 Encounter for antineoplastic radiation therapy: Secondary | ICD-10-CM | POA: Diagnosis not present

## 2020-11-03 DIAGNOSIS — Z170 Estrogen receptor positive status [ER+]: Secondary | ICD-10-CM | POA: Diagnosis not present

## 2020-11-03 DIAGNOSIS — C50412 Malignant neoplasm of upper-outer quadrant of left female breast: Secondary | ICD-10-CM | POA: Diagnosis not present

## 2020-11-04 DIAGNOSIS — C50912 Malignant neoplasm of unspecified site of left female breast: Secondary | ICD-10-CM | POA: Diagnosis not present

## 2020-11-04 DIAGNOSIS — Z170 Estrogen receptor positive status [ER+]: Secondary | ICD-10-CM | POA: Diagnosis not present

## 2020-11-04 DIAGNOSIS — C50412 Malignant neoplasm of upper-outer quadrant of left female breast: Secondary | ICD-10-CM | POA: Diagnosis not present

## 2020-11-04 DIAGNOSIS — Z510 Encounter for antineoplastic radiation therapy: Secondary | ICD-10-CM | POA: Diagnosis not present

## 2020-11-05 DIAGNOSIS — C50412 Malignant neoplasm of upper-outer quadrant of left female breast: Secondary | ICD-10-CM | POA: Diagnosis not present

## 2020-11-05 DIAGNOSIS — Z510 Encounter for antineoplastic radiation therapy: Secondary | ICD-10-CM | POA: Diagnosis not present

## 2020-11-05 DIAGNOSIS — Z170 Estrogen receptor positive status [ER+]: Secondary | ICD-10-CM | POA: Diagnosis not present

## 2020-11-06 DIAGNOSIS — Z170 Estrogen receptor positive status [ER+]: Secondary | ICD-10-CM | POA: Diagnosis not present

## 2020-11-06 DIAGNOSIS — Z510 Encounter for antineoplastic radiation therapy: Secondary | ICD-10-CM | POA: Diagnosis not present

## 2020-11-06 DIAGNOSIS — C50412 Malignant neoplasm of upper-outer quadrant of left female breast: Secondary | ICD-10-CM | POA: Diagnosis not present

## 2020-11-06 DIAGNOSIS — C50912 Malignant neoplasm of unspecified site of left female breast: Secondary | ICD-10-CM | POA: Diagnosis not present

## 2020-11-07 DIAGNOSIS — C50412 Malignant neoplasm of upper-outer quadrant of left female breast: Secondary | ICD-10-CM | POA: Diagnosis not present

## 2020-11-07 DIAGNOSIS — R197 Diarrhea, unspecified: Secondary | ICD-10-CM | POA: Diagnosis not present

## 2020-11-07 DIAGNOSIS — Z510 Encounter for antineoplastic radiation therapy: Secondary | ICD-10-CM | POA: Diagnosis not present

## 2020-11-07 DIAGNOSIS — Z170 Estrogen receptor positive status [ER+]: Secondary | ICD-10-CM | POA: Diagnosis not present

## 2020-11-10 DIAGNOSIS — C50412 Malignant neoplasm of upper-outer quadrant of left female breast: Secondary | ICD-10-CM | POA: Diagnosis not present

## 2020-11-10 DIAGNOSIS — Z510 Encounter for antineoplastic radiation therapy: Secondary | ICD-10-CM | POA: Diagnosis not present

## 2020-11-10 DIAGNOSIS — Z170 Estrogen receptor positive status [ER+]: Secondary | ICD-10-CM | POA: Diagnosis not present

## 2020-11-11 DIAGNOSIS — Z170 Estrogen receptor positive status [ER+]: Secondary | ICD-10-CM | POA: Diagnosis not present

## 2020-11-11 DIAGNOSIS — C50412 Malignant neoplasm of upper-outer quadrant of left female breast: Secondary | ICD-10-CM | POA: Diagnosis not present

## 2020-11-11 DIAGNOSIS — Z510 Encounter for antineoplastic radiation therapy: Secondary | ICD-10-CM | POA: Diagnosis not present

## 2020-11-12 DIAGNOSIS — Z510 Encounter for antineoplastic radiation therapy: Secondary | ICD-10-CM | POA: Diagnosis not present

## 2020-11-12 DIAGNOSIS — Z170 Estrogen receptor positive status [ER+]: Secondary | ICD-10-CM | POA: Diagnosis not present

## 2020-11-12 DIAGNOSIS — C50412 Malignant neoplasm of upper-outer quadrant of left female breast: Secondary | ICD-10-CM | POA: Diagnosis not present

## 2020-11-24 DIAGNOSIS — Z170 Estrogen receptor positive status [ER+]: Secondary | ICD-10-CM | POA: Diagnosis not present

## 2020-11-24 DIAGNOSIS — C50412 Malignant neoplasm of upper-outer quadrant of left female breast: Secondary | ICD-10-CM | POA: Diagnosis not present

## 2020-12-11 ENCOUNTER — Telehealth: Payer: Self-pay | Admitting: *Deleted

## 2020-12-11 NOTE — Telephone Encounter (Signed)
   [facility] Pre-operative Risk Assessment    Patient Name: Kellie Reynolds  DOB: August 29, 1955 MRN: [id number]  HEARTCARE STAFF:  - IMPORTANT!!!!!! Under Visit Info/Reason for Call, type in Other and utilize the format Clearance MM/DD/YY or Clearance TBD. Do not use dashes or single digits. - Please review there is not already an duplicate clearance open for this procedure. - If request is for dental extraction, please clarify the # of teeth to be extracted. - If the patient is currently at the dentist's office, call Pre-Op Callback Staff (MA/nurse) to input urgent request.  - If the patient is not currently in the dentist office, please route to the Pre-Op pool.  Request for surgical clearance: PT WILL NEED CLEANING AND FILLINGS AT FUTURE APPT; I DID INFORM DDS OFFICE WE WILL NEED NEW CLEARANCE REQUEST WHEN READY FOR NEXT PART OF Tx PLAN.    What type of surgery is being performed? PROCEDURE TO BE DONE AT THIS TIME IS FOR 1 TOOTH EXTRACTION.   When is this surgery scheduled? TBD  What type of clearance is required (medical clearance vs. Pharmacy clearance to hold med vs. Both)? BOTH  Are there any medications that need to be held prior to surgery and how long? [facility]  Practice name and name of physician performing surgery? [place]; DR. Leonides Schanz  What is the office phone number? [phone]   6.   What is the office fax number? [phone]  8.   Anesthesia type (None, local, MAC, general) ? LOCAL   Julaine Hua 12/11/2020, 3:42 PM  _________________________________________________________________   (provider comments below)

## 2020-12-12 NOTE — Telephone Encounter (Signed)
   Patient Name: [name]  DOB: 30-Sep-1955 MRN: [id number]  Primary Cardiologist: Thompson Grayer, MD  Chart reviewed as part of pre-operative protocol coverage.   Simple dental extractions are considered low risk procedures per guidelines and generally do not require any specific cardiac clearance. It is also generally accepted that for simple extractions and dental cleanings, there is no need to interrupt blood thinner therapy.   SBE prophylaxis is not required for the patient from a cardiac standpoint.  I will route this recommendation to the requesting party via Epic fax function and remove from pre-op pool.  Please call with questions.  Tami Lin Alejo Beamer, PA 12/12/2020, 11:13 AM

## 2020-12-22 ENCOUNTER — Other Ambulatory Visit: Payer: Self-pay | Admitting: Internal Medicine

## 2020-12-24 DIAGNOSIS — C50412 Malignant neoplasm of upper-outer quadrant of left female breast: Secondary | ICD-10-CM | POA: Diagnosis not present

## 2020-12-24 DIAGNOSIS — Z170 Estrogen receptor positive status [ER+]: Secondary | ICD-10-CM | POA: Diagnosis not present

## 2021-01-01 DIAGNOSIS — Z0000 Encounter for general adult medical examination without abnormal findings: Secondary | ICD-10-CM | POA: Diagnosis not present

## 2021-01-01 DIAGNOSIS — E7800 Pure hypercholesterolemia, unspecified: Secondary | ICD-10-CM | POA: Diagnosis not present

## 2021-01-01 DIAGNOSIS — I10 Essential (primary) hypertension: Secondary | ICD-10-CM | POA: Diagnosis not present

## 2021-01-01 DIAGNOSIS — R7303 Prediabetes: Secondary | ICD-10-CM | POA: Diagnosis not present

## 2021-01-01 DIAGNOSIS — I4891 Unspecified atrial fibrillation: Secondary | ICD-10-CM | POA: Diagnosis not present

## 2021-01-24 ENCOUNTER — Other Ambulatory Visit: Payer: Self-pay | Admitting: Internal Medicine

## 2021-01-26 NOTE — Telephone Encounter (Signed)
Xarelto 20mg  refill request received. Pt is 65 years old, weight-74.8kg, Crea-0.63 on 01/01/21 via scanned labs from [place], last seen by on Tommye Standard on 01/31/2020 and has appt on 01/30/21 with Dr. Rayann Heman, Diagnosis-Afib, CrCl-106.31ml/min; Dose is appropriate based on dosing criteria. Will send in refill to requested pharmacy.

## 2021-01-30 ENCOUNTER — Encounter: Payer: Self-pay | Admitting: Internal Medicine

## 2021-01-30 ENCOUNTER — Ambulatory Visit: Payer: BC Managed Care – PPO | Admitting: Internal Medicine

## 2021-01-30 ENCOUNTER — Other Ambulatory Visit: Payer: Self-pay

## 2021-01-30 VITALS — BP 130/76 | HR 67 | Ht 68.0 in | Wt 176.8 lb

## 2021-01-30 DIAGNOSIS — I480 Paroxysmal atrial fibrillation: Secondary | ICD-10-CM

## 2021-01-30 DIAGNOSIS — I10 Essential (primary) hypertension: Secondary | ICD-10-CM

## 2021-01-30 DIAGNOSIS — F17200 Nicotine dependence, unspecified, uncomplicated: Secondary | ICD-10-CM

## 2021-01-30 NOTE — Patient Instructions (Addendum)
Medication Instructions:  Your physician recommends that you continue on your current medications as directed. Please refer to the Current Medication list given to you today. *If you need a refill on your cardiac medications before your next appointment, please call your pharmacy*  Lab Work: None. If you have labs (blood work) drawn today and your tests are completely normal, you will receive your results only by: MyChart Message (if you have MyChart) OR A paper copy in the mail If you have any lab test that is abnormal or we need to change your treatment, we will call you to review the results.  Testing/Procedures: None.  Follow-Up: At [facility], you and your health needs are our priority.  As part of our continuing mission to provide you with exceptional heart care, we have created designated Provider Care Teams.  These Care Teams include your primary Cardiologist (physician) and Advanced Practice Providers (APPs -  Physician Assistants and Nurse Practitioners) who all work together to provide you with the care you need, when you need it.  Your physician wants you to follow-up in: 12 months with  the Afib Clinic. They will contact you to schedule.     You will receive a reminder letter in the mail two months in advance. If you don't receive a letter, please call our office to schedule the follow-up appointment.  We recommend signing up for the patient portal called "MyChart".  Sign up information is provided on this After Visit Summary.  MyChart is used to connect with patients for Virtual Visits (Telemedicine).  Patients are able to view lab/test results, encounter notes, upcoming appointments, etc.  Non-urgent messages can be sent to your provider as well.   To learn more about what you can do with MyChart, go to [email or web address].    Any Other Special Instructions Will Be Listed Below (If Applicable).         

## 2021-01-30 NOTE — Progress Notes (Signed)
PCP: Alroy Dust, L.Marlou Sa, MD   Primary EP: Dr Freddie Breech is a 65 y.o. female who presents today for routine electrophysiology followup.  She has been diagnosed with breast cancer since I saw her last.  She underwent lumpectomy and XRT this year.  She is soon to start hormonal therapy.  She has rare afib events.  Today, she denies symptoms of palpitations, chest pain, shortness of breath,  lower extremity edema, dizziness, presyncope, or syncope.  The patient is otherwise without complaint today.   Past Medical History:  Diagnosis Date   Arthritis    right knee   Family history of adverse reaction to anesthesia 18 yrs ago   daughter had malignant hyperthermia reaction with acentine, daughter has had surgeries since then and did welll   Fibrocystic breast disease    GERD (gastroesophageal reflux disease)    HTN (hypertension)    Hypercholesteremia    MVP (mitral valve prolapse)    Paroxysmal atrial fibrillation (HCC)    a. s/p PVI   Skin cancer    Sleep apnea    uses cpap 4 nights per week, pt does not knoe settings   Past Surgical History:  Procedure Laterality Date   ATRIAL FIBRILLATION ABLATION N/A 04/18/2012   PVI by Thorin Starner   COLONOSCOPY WITH PROPOFOL N/A 11/24/2015   Procedure: COLONOSCOPY WITH PROPOFOL;  Surgeon: Garlan Fair, MD;  Location: [facility];  Service: Endoscopy;  Laterality: N/A;   EYE SURGERY Bilateral    lasix eye surgery both eyes   MENISECTOMY     Right knee   NASAL POLYP SURGERY  1990   NASAL SINUS SURGERY  1990   TEE WITHOUT CARDIOVERSION  04/17/2012   Procedure: TRANSESOPHAGEAL ECHOCARDIOGRAM (TEE);  Surgeon: Jettie Booze, MD;  Location: [place];  Service: Cardiovascular;  Laterality: N/A;  pre ablation   TUBAL LIGATION      ROS- all systems are reviewed and negatives except as per HPI above  Current Outpatient Medications  Medication Sig Dispense Refill   acetaminophen (TYLENOL) 500 MG tablet Take 500 mg by mouth every 6  (six) hours as needed.     atorvastatin (LIPITOR) 20 MG tablet Take 20 mg by mouth daily.     citalopram (CELEXA) 10 MG tablet Take 10 mg by mouth daily.     flecainide (TAMBOCOR) 100 MG tablet TAKE 1/2 TABLET BY MOUTH TWICE A DAY (NEED OFFICE VISIT) 90 tablet 0   losartan (COZAAR) 50 MG tablet Take 50 mg by mouth daily.     metoprolol tartrate (LOPRESSOR) 50 MG tablet TAKE 0.5 TABLETS BY MOUTH 2 TIMES DAILY. 90 tablet 3   sodium chloride (OCEAN) 0.65 % nasal spray Place 1 spray into the nose 2 (two) times daily as needed. For dryness     XARELTO 20 MG TABS tablet TAKE 1 TABLET BY MOUTH DAILY WITH SUPPER. 30 tablet 5   No current facility-administered medications for this visit.    Physical Exam: Vitals:   01/30/21 1512  BP: 130/76  Pulse: 67  SpO2: 96%  Weight: 176 lb 12.8 oz (80.2 kg)  Height: 5\' 8"  (1.727 m)    GEN- The patient is well appearing, alert and oriented x 3 today.   Head- normocephalic, atraumatic Eyes-  Sclera clear, conjunctiva pink Ears- hearing intact Oropharynx- clear Lungs- Clear to ausculation bilaterally, normal work of breathing Heart- Regular rate and rhythm, no murmurs, rubs or gallops, PMI not laterally displaced GI- soft, NT, ND, + BS Extremities-  no clubbing, cyanosis, or edema  Wt Readings from Last 3 Encounters:  01/30/21 176 lb 12.8 oz (80.2 kg)  06/16/20 165 lb (74.8 kg)  01/31/20 167 lb 6.4 oz (75.9 kg)    EKG tracing ordered today is personally reviewed and shows sinus rhythm  Assessment and Plan:  Paroxysaml atrial fibrillation Well controlled post PVI in 2014 Chads2vasc score is 1.  She has preferred to stay on xarelto   2. HTN Stable No change required today  Return to see AF clinic in a year  Thompson Grayer MD, [facility] 01/30/2021 3:28 PM

## 2021-02-02 DIAGNOSIS — Z170 Estrogen receptor positive status [ER+]: Secondary | ICD-10-CM | POA: Diagnosis not present

## 2021-02-02 DIAGNOSIS — C50412 Malignant neoplasm of upper-outer quadrant of left female breast: Secondary | ICD-10-CM | POA: Diagnosis not present

## 2021-02-10 DIAGNOSIS — M8589 Other specified disorders of bone density and structure, multiple sites: Secondary | ICD-10-CM | POA: Diagnosis not present

## 2021-02-10 DIAGNOSIS — M47816 Spondylosis without myelopathy or radiculopathy, lumbar region: Secondary | ICD-10-CM | POA: Diagnosis not present

## 2021-02-10 DIAGNOSIS — Z170 Estrogen receptor positive status [ER+]: Secondary | ICD-10-CM | POA: Diagnosis not present

## 2021-02-10 DIAGNOSIS — Z780 Asymptomatic menopausal state: Secondary | ICD-10-CM | POA: Diagnosis not present

## 2021-02-10 DIAGNOSIS — C50412 Malignant neoplasm of upper-outer quadrant of left female breast: Secondary | ICD-10-CM | POA: Diagnosis not present

## 2021-02-24 DIAGNOSIS — R5383 Other fatigue: Secondary | ICD-10-CM | POA: Diagnosis not present

## 2021-02-24 DIAGNOSIS — R0602 Shortness of breath: Secondary | ICD-10-CM | POA: Diagnosis not present

## 2021-02-24 DIAGNOSIS — I10 Essential (primary) hypertension: Secondary | ICD-10-CM | POA: Diagnosis not present

## 2021-02-24 DIAGNOSIS — R799 Abnormal finding of blood chemistry, unspecified: Secondary | ICD-10-CM | POA: Diagnosis not present

## 2021-02-24 DIAGNOSIS — E7849 Other hyperlipidemia: Secondary | ICD-10-CM | POA: Diagnosis not present

## 2021-02-24 DIAGNOSIS — R002 Palpitations: Secondary | ICD-10-CM | POA: Diagnosis not present

## 2021-02-27 ENCOUNTER — Other Ambulatory Visit: Payer: Self-pay | Admitting: Family Medicine

## 2021-02-27 ENCOUNTER — Other Ambulatory Visit (HOSPITAL_COMMUNITY): Payer: Self-pay | Admitting: Family Medicine

## 2021-02-27 DIAGNOSIS — E079 Disorder of thyroid, unspecified: Secondary | ICD-10-CM

## 2021-02-27 DIAGNOSIS — Z87891 Personal history of nicotine dependence: Secondary | ICD-10-CM

## 2021-03-17 ENCOUNTER — Encounter: Payer: Self-pay | Admitting: Family Medicine

## 2021-03-25 ENCOUNTER — Encounter: Payer: Self-pay | Admitting: Gastroenterology

## 2021-03-25 ENCOUNTER — Encounter (HOSPITAL_COMMUNITY)
Admission: RE | Admit: 2021-03-25 | Discharge: 2021-03-25 | Disposition: A | Discharge status: Home / Self Care | Payer: BC Managed Care – PPO | Source: Ambulatory Visit | Attending: Family Medicine | Admitting: Family Medicine

## 2021-03-25 ENCOUNTER — Ambulatory Visit (HOSPITAL_COMMUNITY)
Admission: RE | Admit: 2021-03-25 | Discharge: 2021-03-25 | Disposition: A | Discharge status: Home / Self Care | Payer: BC Managed Care – PPO | Source: Ambulatory Visit | Attending: Family Medicine | Admitting: Family Medicine

## 2021-03-25 ENCOUNTER — Other Ambulatory Visit: Payer: Self-pay

## 2021-03-25 DIAGNOSIS — Z87891 Personal history of nicotine dependence: Secondary | ICD-10-CM | POA: Diagnosis not present

## 2021-03-25 DIAGNOSIS — F17210 Nicotine dependence, cigarettes, uncomplicated: Secondary | ICD-10-CM | POA: Diagnosis not present

## 2021-03-25 DIAGNOSIS — E079 Disorder of thyroid, unspecified: Secondary | ICD-10-CM | POA: Insufficient documentation

## 2021-03-25 DIAGNOSIS — E041 Nontoxic single thyroid nodule: Secondary | ICD-10-CM | POA: Diagnosis not present

## 2021-03-25 MED ORDER — SODIUM IODIDE I-123 7.4 MBQ CAPS
436.0000 | ORAL_CAPSULE | Freq: Once | ORAL | Status: AC
  Administered 2021-03-25: 436 via ORAL

## 2021-03-26 ENCOUNTER — Encounter (HOSPITAL_COMMUNITY)
Admission: RE | Admit: 2021-03-26 | Discharge: 2021-03-26 | Disposition: A | Discharge status: Home / Self Care | Payer: BC Managed Care – PPO | Source: Ambulatory Visit | Attending: Family Medicine | Admitting: Family Medicine

## 2021-03-26 DIAGNOSIS — E0590 Thyrotoxicosis, unspecified without thyrotoxic crisis or storm: Secondary | ICD-10-CM | POA: Diagnosis not present

## 2021-03-27 DIAGNOSIS — K625 Hemorrhage of anus and rectum: Secondary | ICD-10-CM | POA: Diagnosis not present

## 2021-03-27 DIAGNOSIS — I10 Essential (primary) hypertension: Secondary | ICD-10-CM | POA: Diagnosis not present

## 2021-03-27 DIAGNOSIS — E7849 Other hyperlipidemia: Secondary | ICD-10-CM | POA: Diagnosis not present

## 2021-03-27 DIAGNOSIS — F419 Anxiety disorder, unspecified: Secondary | ICD-10-CM | POA: Diagnosis not present

## 2021-04-17 ENCOUNTER — Telehealth: Payer: Self-pay

## 2021-04-17 ENCOUNTER — Encounter: Payer: Self-pay | Admitting: Gastroenterology

## 2021-04-17 ENCOUNTER — Ambulatory Visit (INDEPENDENT_AMBULATORY_CARE_PROVIDER_SITE_OTHER): Payer: BC Managed Care – PPO | Admitting: Gastroenterology

## 2021-04-17 VITALS — BP 150/80 | HR 61 | Ht 68.0 in | Wt 180.0 lb

## 2021-04-17 DIAGNOSIS — K219 Gastro-esophageal reflux disease without esophagitis: Secondary | ICD-10-CM

## 2021-04-17 DIAGNOSIS — K921 Melena: Secondary | ICD-10-CM | POA: Diagnosis not present

## 2021-04-17 DIAGNOSIS — Z86010 Personal history of colonic polyps: Secondary | ICD-10-CM | POA: Diagnosis not present

## 2021-04-17 DIAGNOSIS — I700 Atherosclerosis of aorta: Secondary | ICD-10-CM

## 2021-04-17 MED ORDER — SUTAB 1479-225-188 MG PO TABS: 1.0000 | ORAL_TABLET | Freq: Once | ORAL | 0 refills | Status: AC

## 2021-04-17 MED ORDER — OMEPRAZOLE 40 MG PO CPDR: 40.0000 mg | DELAYED_RELEASE_CAPSULE | Freq: Two times a day (BID) | ORAL | 3 refills | Status: DC

## 2021-04-17 NOTE — Progress Notes (Signed)
HPI : Kellie Reynolds is a pleasant 66 year old female with atrial fibrillation, chronic tobacco use and HTN who is referred to by Duard Larsen, NP for further evaluation and management of GERD, as well as for surveillance colonoscopy.  The patient states she has had GERD symptoms for many years.  Previously her symptoms were infrequent and not very bothersome.  In the past year, her symptoms have worsened in severity and frequency.  She has symptoms pretty much every day.  Her symptoms consist of severe burning pain in her chest and throat as well as acid regurgitation.  Symptoms occur both during the day and night.  She is awakened from sleep because of GERD about 4 nights/week.  She denies symptoms of esophageal dysphagia.  Sometimes has nausea and vomiting.  No significant abdominal pain.  No weight loss, rather she has gained about 15-20 lbs over the past year. She used to take Zantac for her GERD symptoms, which typically worked well.  She tried taking OTC Nexium once a day, but continued to have symptoms.  She started taking Protonix 40 mg PO daily about 2 weeks ago and says that this has helped reduce the severity, but not the frequency, of her symptoms.  She sleeps on two pillows at night.  She typically eats dinner at 6:30 and goes to bed around 8:30-9:00.   She reports having an EGD many years ago because of an episode of blood tinged emesis.  She thinks this was normal. Her last colonoscopy was in 2017 and a 10 mm sessile serrated polyp was removed in the ascending colon. She has been having loose stools recently, usually 3-4/day.  She also started seeing bright red blood per rectum a few months ago.  She will notice bright red blood on the toilet paper, in the water and on the stool about twice a week.  No other symptoms accompany the blood (abdominal pain, pain with passage of stool, straining) She has a history of a-fib s/p ablation and is rate controlled with lopressor and flecainide.  She  still sometimes has episodes of rapid HR with palpitations, but these are infrequent.  She denies symptoms of chest pain/pressure or exertional dyspnea.  She is active home and watches after her 19 year old grandchild.  She rides a bicycle and does yard work and is not limited by any cardiopulmonary symptoms.  She denies history of adverse reactions to anesthesia other than remembering significant burning pain when she got proprofol last time.  Past Medical History:  Diagnosis Date   Arthritis    right knee   Family history of adverse reaction to anesthesia 15 yrs ago   daughter had malignant hyperthermia reaction with acentine, daughter has had surgeries since then and did welll   Fibrocystic breast disease    GERD (gastroesophageal reflux disease)    HTN (hypertension)    Hypercholesteremia    MVP (mitral valve prolapse)    Paroxysmal atrial fibrillation (HCC)    a. s/p PVI   Skin cancer    Sleep apnea    uses cpap 4 nights per week, pt does not knoe settings     Past Surgical History:  Procedure Laterality Date   ATRIAL FIBRILLATION ABLATION N/A 04/18/2012   PVI by Allred   COLONOSCOPY WITH PROPOFOL N/A 11/24/2015   Procedure: COLONOSCOPY WITH PROPOFOL;  Surgeon: Garlan Fair, MD;  Location: [facility];  Service: Endoscopy;  Laterality: N/A;   EYE SURGERY Bilateral    lasix eye  surgery both eyes   MENISECTOMY     Right knee   NASAL POLYP SURGERY  1990   NASAL SINUS SURGERY  1990   TEE WITHOUT CARDIOVERSION  04/17/2012   Procedure: TRANSESOPHAGEAL ECHOCARDIOGRAM (TEE);  Surgeon: Jettie Booze, MD;  [facility] END[facility]COPY;  Service: Cardiovascular;  Laterality: N/A;  pre ablation   TUBAL LIGATION     Family History  Problem Relation Age of Onset   Diabetes Mother    Hyperlipidemia Father    Hypertension Father    CAD Father    Heart attack Father    Liver cancer Maternal Grandmother    Social History   Tobacco Use   Smoking status: Every Day    Packs/day:  1.00    Types: Cigarettes   Smokeless tobacco: Never  Vaping Use   Vaping Use: Never used  Substance Use Topics   Alcohol use: Yes    Comment: 4 beers oper day, regular sized beers   Drug use: No   Current Outpatient Medications  Medication Sig Dispense Refill   acetaminophen (TYLENOL) 500 MG tablet Take 500 mg by mouth every 6 (six) hours as needed.     atorvastatin (LIPITOR) 20 MG tablet Take 20 mg by mouth daily.     citalopram (CELEXA) 10 MG tablet Take 10 mg by mouth daily.     flecainide (TAMBOCOR) 100 MG tablet TAKE 1/2 TABLET BY MOUTH TWICE A DAY (NEED OFFICE VISIT) 90 tablet 0   losartan (COZAAR) 50 MG tablet Take 50 mg by mouth daily.     metoprolol tartrate (LOPRESSOR) 50 MG tablet TAKE 0.5 TABLETS BY MOUTH 2 TIMES DAILY. 90 tablet 3   sodium chloride (OCEAN) 0.65 % nasal spray Place 1 spray into the nose 2 (two) times daily as needed. For dryness     XARELTO 20 MG TABS tablet TAKE 1 TABLET BY MOUTH DAILY WITH SUPPER. 30 tablet 5   No current facility-administered medications for this visit.   No Known Allergies   Review of Systems: All systems reviewed and negative except where noted in HPI.    NM THYROID MULT UPTAKE W/IMAGING  Result Date: 03/26/2021 CLINICAL DATA:  Hyperthyroidism EXAM: THYROID SCAN AND UPTAKE - 4 AND 24 HOURS TECHNIQUE: Following oral administration of I-123 capsule, anterior planar imaging was acquired at 24 hours. Thyroid uptake was calculated with a thyroid probe at 4-6 hours and 24 hours. RADIOPHARMACEUTICALS:  436 uCi I-123 sodium iodide p.o. COMPARISON:  Thyroid ultrasound 03/25/2021 FINDINGS: Homogeneous tracer distribution LEFT thyroid lobe. Subtle areas of decreased tracer uptake at lateral aspect of mid and inferior RIGHT thyroid lobe; these correspond to nodules identified on ultrasound. No additional areas of increased or decreased tracer uptake seen. 4 hour I-123 uptake = 10.1% (normal 5-20%) 24 hour I-123 uptake = 25.5% (normal 10-30%)  IMPRESSION: Normal 4 hour and 24 hour radio iodine uptakes. Subtle cold nodules at the lateral aspect of the mid and inferior RIGHT thyroid lobe, corresponding to nodules identified by thyroid ultrasound; please see recommendations of recent thyroid ultrasound. Electronically Signed   By: Lavonia Dana M.D.   On: 03/26/2021 10:20   CT CHEST LUNG CA SCREEN LOW DOSE W/O CM  Result Date: 03/25/2021 CLINICAL DATA:  Eighty-three pack-year smoking history, current smoker. EXAM: CT CHEST WITHOUT CONTRAST LOW-DOSE FOR LUNG CANCER SCREENING TECHNIQUE: Multidetector CT imaging of the chest was performed following the standard protocol without IV contrast. RADIATION DOSE REDUCTION: This exam was performed according to the departmental dose-optimization program which  includes automated exposure control, adjustment of the mA and/or kV according to patient size and/or use of iterative reconstruction technique. COMPARISON:  None. FINDINGS: Cardiovascular: Atherosclerotic calcification of the aorta and coronary arteries. Heart size normal. No pericardial effusion. Mediastinum/Nodes: No pathologically enlarged mediastinal or axillary lymph nodes. Hilar regions are difficult to definitively evaluate without IV contrast. Esophagus is grossly unremarkable. Lungs/Pleura: Biapical pleuroparenchymal scarring. Centrilobular and paraseptal emphysema. Smoking related respiratory bronchiolitis. 4.8 mm lymph node along the left major fissure. No suspicious pulmonary nodules. No pleural fluid. Airway is unremarkable. Upper Abdomen: Visualized portions of the liver, adrenal glands, kidneys, spleen, pancreas and stomach are grossly unremarkable. Musculoskeletal: Degenerative changes in the spine. No worrisome lytic or sclerotic lesions. IMPRESSION: 1. Lung-RADS 2, benign appearance or behavior. Continue annual screening with low-dose chest CT without contrast in 12 months. 2. Aortic atherosclerosis (ICD10-I70.0). Coronary artery calcification.  3.  Emphysema (ICD10-J43.9). Electronically Signed   By: Lorin Picket M.D.   On: 03/25/2021 11:48   US THYROID  Result Date: 03/25/2021 CLINICAL DATA:  Thyroid disorder EXAM: THYROID ULTRASOUND TECHNIQUE: Ultrasound examination of the thyroid gland and adjacent soft tissues was performed. COMPARISON:  12/21/2006 nuclear medicine thyroid scan FINDINGS: Parenchymal Echotexture: Mildly heterogenous Isthmus: 4 mm Right lobe: 5.6 x 2.0 x 2.7 cm Left lobe: 5.8 x 1.2 x 2.8 cm _________________________________________________________ Estimated total number of nodules >/= 1 cm: 3 Number of spongiform nodules >/=  2 cm not described below (TR1): 0 Number of mixed cystic and solid nodules >/= 1.5 cm not described below (TR2): 0 _________________________________________________________ Nodule # 1: Location: Right; Mid Maximum size: 1.1 cm; Other 2 dimensions: 0.8 x 1.1 cm Composition: solid/almost completely solid (2) Echogenicity: hypoechoic (2) Shape: not taller-than-wide (0) Margins: smooth (0) Echogenic foci: macrocalcifications (1) ACR TI-RADS total points: 5. ACR TI-RADS risk category: TR4 (4-6 points). ACR TI-RADS recommendations: *Given size (>/= 1 - 1.4 cm) and appearance, a follow-up ultrasound in 1 year should be considered based on TI-RADS criteria. _________________________________________________________ Nodule # 2: Location: Right; Mid Maximum size: 1.9 cm; Other 2 dimensions: 0.9 x 1.4 cm Composition: mixed cystic and solid (1) Echogenicity: isoechoic (1) Shape: not taller-than-wide (0) Margins: ill-defined (0) Echogenic foci: none (0) ACR TI-RADS total points: 2. ACR TI-RADS risk category: TR2 (2 points). ACR TI-RADS recommendations: This nodule does NOT meet TI-RADS criteria for biopsy or dedicated follow-up. _________________________________________________________ Nodule # 3: Location: Right; Inferior Maximum size: 1.2 cm; Other 2 dimensions: 1.1 x 0.7 cm Composition: spongiform (0) Echogenicity:  isoechoic (1) Shape: not taller-than-wide (0) Margins: smooth (0) Echogenic foci: none (0) ACR TI-RADS total points: 1. ACR TI-RADS risk category: TR1 (0-1 points). ACR TI-RADS recommendations: This nodule does NOT meet TI-RADS criteria for biopsy or dedicated follow-up. _________________________________________________________ Tiny punctate cystic nodules in the left thyroid incidentally noted. No hypervascularity. No regional adenopathy. IMPRESSION: 1.1 cm right mid thyroid TR 4 nodule meets criteria for follow-up in 1 year. Currently no biopsy indicated. Additional benign nodules noted as above. The above is in keeping with the ACR TI-RADS recommendations - J Am Coll Radiol 2017;14:587-595. Electronically Signed   By: Jerilynn Mages.  Shick M.D.   On: 03/25/2021 09:42    Physical Exam: BP (!) 150/80    Pulse 61    Ht 5\' 8"  (1.727 m)    Wt 180 lb (81.6 kg)    BMI 27.37 kg/m  Constitutional: Pleasant,well-developed, Caucasian female in no acute distress. HEENT: Normocephalic and atraumatic. Conjunctivae are normal. No scleral icterus. Cardiovascular: Normal rate, regular rhythm.  Pulmonary/chest: Effort normal and breath sounds normal. No wheezing, rales or rhonchi. Abdominal: Soft, nondistended, nontender. Bowel sounds active throughout. There are no masses palpable. No hepatomegaly. Extremities: no edema Rectal: Deferred until time of colonoscopy Neurological: Alert and oriented to person place and time. Skin: Skin is warm and dry. No rashes noted. Psychiatric: Normal mood and affect. Behavior is normal.  CBC    Component Value Date/Time   WBC 6.7 10/07/2014 1600   RBC 4.26 10/07/2014 1600   HGB 13.5 10/07/2014 1600   HCT 39.2 10/07/2014 1600   PLT 144 (L) 10/07/2014 1600   MCV 92.0 10/07/2014 1600   MCH 31.7 10/07/2014 1600   MCHC 34.4 10/07/2014 1600   RDW 13.3 10/07/2014 1600   LYMPHSABS 1.8 04/16/2014 0950   MONOABS 0.6 04/16/2014 0950   EOSABS 0.7 04/16/2014 0950   BASOSABS 0.0 04/16/2014  0950    CMP     Component Value Date/Time   NA 138 10/07/2014 1600   K 4.5 10/07/2014 1600   CL 103 10/07/2014 1600   CO2 29 10/07/2014 1600   GLUCOSE 126 (H) 10/07/2014 1600   BUN 10 10/07/2014 1600   CREATININE 0.73 10/07/2014 1600   CALCIUM 9.6 10/07/2014 1600   PROT 6.6 04/16/2014 0950   ALBUMIN 3.6 04/16/2014 0950   AST 18 04/16/2014 0950   ALT 16 04/16/2014 0950   ALKPHOS 65 04/16/2014 0950   BILITOT 0.3 04/16/2014 0950   GFRNONAA >60 10/07/2014 1600   GFRAA >60 10/07/2014 1600     ASSESSMENT AND PLAN: 66 year old female with atrial fibrillation and chronic tobacco use, with daily severe typical GERD symptoms persistent despite Protonix 40 mg PO daily, also overdue for surveillance colonoscopy. We discussed the pathophysiology of GERD and the principles of GERD management to include lifestyle modifications  such as dietary discretion (avoidance of alcohol, tobacco, caffeinated and carbonated beverages, spicy/greasy foods, citrus, peppermint/chocolate), weight loss if applicable, head of bed elevation andconsuming last meal of day within 3 hours of bedtime; pharmacologic options to include PPIs, H2RAs and OTC antacids; and finally surgical or endoscopic fundoplication. I suspect the patient's GERD symptoms worsened from their baseline due to her weight gain.  I think her symptoms would likely improve significantly with weight loss and smoking cessation.  Given the PPI refractory nature of her symptoms, an EGD is indicated to assess anatomy and assess for presence of esophagitis.  Recommended she increase her Protonix to twice daily (30-45 min before meals) in the meantime With regards to her loose stools and intermittent hematochezia, I recommended the patient start taking a daily fiber supplement such as Metamucil, to improve her stool bulk/consistency.  The hematochezia is most likely secondary to hemorrhoids.  She is overdue for surveillance colonoscopy.  Will schedule for  routine colonoscopy.  She will need to hold Xarelto for 2 days prior to her colonoscopy.  Will reach out to her cardiologist to ensure this is reasonable.  GERD - Increase Prilosec 40 mg BID - EGD - Reinforced weight loss and smoking cessation  History of colon polyp (10 mm SSP in [facility] 2017) - Surveillance Colonoscopy - Xarelto:  Hold x 2 days prior to EGD/colonoscopy  Hematochezia - Most likely hemorrhoidal, recommended daily fiber supplementation  The details, risks (including bleeding, perforation, infection, missed lesions, medication reactions and possible hospitalization or surgery if complications occur), benefits, and alternatives to EGD/colonoscopy with possible biopsy and possible polypectomy were discussed with the patient and she consents to proceed.   Raedyn Klinck E.  Candis Schatz, MD [facility]  CC:  Barnetta Chapel, NP

## 2021-04-17 NOTE — Telephone Encounter (Signed)
[facility] Pre-operative Risk Assessment     Request for surgical clearance:     Endoscopy Procedure  What type of surgery is being performed?     Endoscopy and Colonoscopy   When is this surgery scheduled?     06/05/21  What type of clearance is required ?   Pharmacy  Are there any medications that need to be held prior to surgery and how long? Xarelto 2 days  Practice name and name of physician performing surgery?      [facility]  What is your office phone and fax number?      Phone- [phone]  Fax501 [phone]  Anesthesia type (None, local, MAC, general) ?       MAC

## 2021-04-17 NOTE — Telephone Encounter (Signed)
Please comment on holding Xarelto prior to endoscopy and colonoscopy and send back to [facility]

## 2021-04-17 NOTE — Patient Instructions (Signed)
If you are age 65 or older, your body mass index should be between 23-30. Your Body mass index is 27.37 kg/m. If this is out of the aforementioned range listed, please consider follow up with your Primary Care Provider.  If you are age 38 or younger, your body mass index should be between 19-25. Your Body mass index is 27.37 kg/m. If this is out of the aformentioned range listed, please consider follow up with your Primary Care Provider.   Please purchase Metamucil over the counter. Take as directed.   You have been scheduled for an endoscopy and colonoscopy. Please follow the written instructions given to you at your visit today. Please pick up your prep supplies at the pharmacy within the next 1-3 days. If you use inhalers (even only as needed), please bring them with you on the day of your procedure.   We have sent the following medications to your pharmacy for you to pick up at your convenience: Omeprazole 40 mg twice daily 30-45 minutes before meals.  The [facility] providers would like to encourage you to use [facility] to communicate with providers for non-urgent requests or questions.  Due to long hold times on the telephone, sending your provider a message by [facility] may be a faster and more efficient way to get a response.  Please allow 48 business hours for a response.  Please remember that this is for non-urgent requests.   It was a pleasure to see you today!  Thank you for trusting me with your gastrointestinal care!    Scott E.Candis Schatz ,MD

## 2021-04-18 ENCOUNTER — Encounter: Payer: Self-pay | Admitting: Gastroenterology

## 2021-04-18 ENCOUNTER — Other Ambulatory Visit: Payer: Self-pay | Admitting: Internal Medicine

## 2021-04-20 DIAGNOSIS — F17200 Nicotine dependence, unspecified, uncomplicated: Secondary | ICD-10-CM | POA: Insufficient documentation

## 2021-04-20 DIAGNOSIS — I700 Atherosclerosis of aorta: Secondary | ICD-10-CM | POA: Insufficient documentation

## 2021-04-20 DIAGNOSIS — Z86010 Personal history of colonic polyps: Secondary | ICD-10-CM | POA: Insufficient documentation

## 2021-04-20 DIAGNOSIS — F419 Anxiety disorder, unspecified: Secondary | ICD-10-CM | POA: Insufficient documentation

## 2021-04-20 DIAGNOSIS — R7303 Prediabetes: Secondary | ICD-10-CM | POA: Insufficient documentation

## 2021-04-20 DIAGNOSIS — M1990 Unspecified osteoarthritis, unspecified site: Secondary | ICD-10-CM | POA: Insufficient documentation

## 2021-04-20 DIAGNOSIS — K219 Gastro-esophageal reflux disease without esophagitis: Secondary | ICD-10-CM | POA: Insufficient documentation

## 2021-04-20 NOTE — Telephone Encounter (Signed)
° ° °  Patient Name: Kellie Reynolds  DOB: [birth year] MRN: [id number]  Primary Cardiologist: Thompson Grayer, MD  Chart reviewed as part of pre-operative protocol coverage. Patient has an upcoming endoscopy planned and we were asked to give our recommendations for holding Xarelto. Per Pharmacy and office protocol, patient can hold Xarelto for 2 days prior to procedure. Please restart this as soon as possible afterwards.   I will route this recommendation to the requesting party via Epic fax function and remove from pre-op pool.  Please call with questions.  Darreld Mclean, PA-C 04/20/2021, 4:39 PM

## 2021-04-20 NOTE — Telephone Encounter (Signed)
Patient with diagnosis of afib on Xarelto for anticoagulation.    Procedure: endoscopy and colonoscopy Date of procedure: 06/05/21  CHA2DS2-VASc Score = 3  This indicates a 3.2% annual risk of stroke. The patient's score is based upon: CHF History: 0 HTN History: 1 Diabetes History: 0 Stroke History: 0 Vascular Disease History: 1 Age Score: 0 Gender Score: 1  Aortic atherosclerosis noted on chest CT 03/2021, this has been added to her problem list.  CrCl >116mL/min  Per office protocol, patient can hold Xarelto for 2 days prior to procedure as requested.

## 2021-04-22 DIAGNOSIS — R197 Diarrhea, unspecified: Secondary | ICD-10-CM | POA: Diagnosis not present

## 2021-04-22 DIAGNOSIS — I10 Essential (primary) hypertension: Secondary | ICD-10-CM | POA: Diagnosis not present

## 2021-04-22 DIAGNOSIS — Z0289 Encounter for other administrative examinations: Secondary | ICD-10-CM | POA: Diagnosis not present

## 2021-04-22 DIAGNOSIS — R5383 Other fatigue: Secondary | ICD-10-CM | POA: Diagnosis not present

## 2021-04-22 NOTE — Telephone Encounter (Signed)
Patient returned call and I let her know to hold Xarelto 2 days prior to her procedure on 06/05/21.

## 2021-04-22 NOTE — Telephone Encounter (Signed)
Left VM for patient to call back regarding clearance to hold Xarelto 2 days.

## 2021-05-18 DIAGNOSIS — C50412 Malignant neoplasm of upper-outer quadrant of left female breast: Secondary | ICD-10-CM | POA: Diagnosis not present

## 2021-05-18 DIAGNOSIS — Z170 Estrogen receptor positive status [ER+]: Secondary | ICD-10-CM | POA: Diagnosis not present

## 2021-05-18 DIAGNOSIS — Z853 Personal history of malignant neoplasm of breast: Secondary | ICD-10-CM | POA: Diagnosis not present

## 2021-05-18 DIAGNOSIS — R922 Inconclusive mammogram: Secondary | ICD-10-CM | POA: Diagnosis not present

## 2021-05-20 DIAGNOSIS — Z170 Estrogen receptor positive status [ER+]: Secondary | ICD-10-CM | POA: Diagnosis not present

## 2021-05-20 DIAGNOSIS — C50412 Malignant neoplasm of upper-outer quadrant of left female breast: Secondary | ICD-10-CM | POA: Diagnosis not present

## 2021-05-29 ENCOUNTER — Encounter: Payer: Self-pay | Admitting: Gastroenterology

## 2021-06-05 ENCOUNTER — Encounter: Payer: Self-pay | Admitting: Gastroenterology

## 2021-06-05 ENCOUNTER — Other Ambulatory Visit: Payer: Self-pay

## 2021-06-05 ENCOUNTER — Ambulatory Visit: Payer: BC Managed Care – PPO | Admitting: Gastroenterology

## 2021-06-05 VITALS — BP 128/78 | HR 64 | Temp 97.3°F | Resp 16 | Ht 68.0 in | Wt 180.0 lb

## 2021-06-05 DIAGNOSIS — Z86010 Personal history of colonic polyps: Secondary | ICD-10-CM | POA: Diagnosis not present

## 2021-06-05 DIAGNOSIS — D125 Benign neoplasm of sigmoid colon: Secondary | ICD-10-CM

## 2021-06-05 DIAGNOSIS — K3189 Other diseases of stomach and duodenum: Secondary | ICD-10-CM | POA: Diagnosis not present

## 2021-06-05 DIAGNOSIS — K529 Noninfective gastroenteritis and colitis, unspecified: Secondary | ICD-10-CM | POA: Diagnosis not present

## 2021-06-05 DIAGNOSIS — K219 Gastro-esophageal reflux disease without esophagitis: Secondary | ICD-10-CM

## 2021-06-05 DIAGNOSIS — K2970 Gastritis, unspecified, without bleeding: Secondary | ICD-10-CM | POA: Diagnosis not present

## 2021-06-05 DIAGNOSIS — K635 Polyp of colon: Secondary | ICD-10-CM | POA: Diagnosis not present

## 2021-06-05 MED ORDER — SODIUM CHLORIDE 0.9 % IV SOLN: 500.0000 mL | INTRAVENOUS | Status: DC

## 2021-06-05 NOTE — Op Note (Signed)
[place] ?Patient Name: Kellie Reynolds ?Procedure Date: 06/05/2021 9:10 AM ?MRN: [id number] ?Endoscopist: Derin Granquist E. Candis Schatz , MD ?Age: 66 ?Referring MD:  ?Date of Birth: [birth year] ?Gender: Female ?Account #: [id number] ?Procedure:                Colonoscopy ?Indications:              High risk colon cancer surveillance: Personal  ?                          history of sessile serrated colon polyp (10 mm or  ?                          greater in size) ?Medicines:                Monitored Anesthesia Care ?Procedure:                Pre-Anesthesia Assessment: ?                          - Prior to the procedure, a History and Physical  ?                          was performed, and patient medications and  ?                          allergies were reviewed. The patient's tolerance of  ?                          previous anesthesia was also reviewed. The risks  ?                          and benefits of the procedure and the sedation  ?                          options and risks were discussed with the patient.  ?                          All questions were answered, and informed consent  ?                          was obtained. Prior Anticoagulants: The patient has  ?                          taken Xarelto (rivaroxaban), last dose was 4 days  ?                          prior to procedure. ASA Grade Assessment: III - A  ?                          patient with severe systemic disease. After  ?                          reviewing the risks and benefits, the patient was  ?  deemed in satisfactory condition to undergo the  ?                          procedure. ?                          After obtaining informed consent, the colonoscope  ?                          was passed under direct vision. Throughout the  ?                          procedure, the patient's blood pressure, pulse, and  ?                          oxygen saturations were monitored continuously. The  ?                           Olympus CF-HQ190L (#[id number]) Colonoscope was  ?                          introduced through the anus and advanced to the the  ?                          cecum, identified by appendiceal orifice and  ?                          ileocecal valve. The colonoscopy was performed  ?                          without difficulty. The patient tolerated the  ?                          procedure well. The quality of the bowel  ?                          preparation was good. The ileocecal valve,  ?                          appendiceal orifice, and rectum were photographed. ?Scope In: 9:28:27 AM ?Scope Out: 9:49:58 AM ?Scope Withdrawal Time: 0 hours 17 minutes 30 seconds  ?Total Procedure Duration: 0 hours 21 minutes 31 seconds  ?Findings:                 The perianal and digital rectal examinations were  ?                          normal. Pertinent negatives include normal  ?                          sphincter tone and no palpable rectal lesions. ?                          An area of granular mucosa was found in the  ?  ascending colon, suspicious for a flat polyp. The  ?                          borders of the lesion were very difficult to  ?                          ascertain. Area was successfully injected with 2 mL  ?                          saline for lesion assessment, and the mucosa  ?                          appeared to lift the lesion adequately. No discreet  ?                          polyp was seen. Estimated blood loss was minimal.  ?                          Biopsies were taken with a cold forceps for  ?                          histology. Estimated blood loss was minimal. ?                          A 3 mm polyp was found in the sigmoid colon. The  ?                          polyp was flat. The polyp was removed with a cold  ?                          snare. Resection and retrieval were complete.  ?                          Estimated blood loss was minimal. ?                          A few  small and large-mouthed diverticula were  ?                          found in the sigmoid colon, descending colon and  ?                          ascending colon. ?                          A single medium-sized angioectasia without bleeding  ?                          was found in the cecum. ?                          The exam was otherwise normal throughout the  ?  examined colon. ?                          The retroflexed view of the distal rectum and anal  ?                          verge was normal and showed no anal or rectal  ?                          abnormalities. ?Complications:            No immediate complications. ?Estimated Blood Loss:     Estimated blood loss was minimal. ?Impression:               - Granular mucosa in the ascending colon. Injected.  ?                          Biopsied to assess for adenomatous features. ?                          - One 3 mm polyp in the sigmoid colon, removed with  ?                          a cold snare. Resected and retrieved. ?                          - Diverticulosis in the sigmoid colon, in the  ?                          descending colon and in the ascending colon. ?                          - A single non-bleeding colonic angioectasia. ?                          - The distal rectum and anal verge are normal on  ?                          retroflexion view. ?Recommendation:           - Patient has a contact number available for  ?                          emergencies. The signs and symptoms of potential  ?                          delayed complications were discussed with the  ?                          patient. Return to normal activities tomorrow.  ?                          Written discharge instructions were provided to the  ?                          patient. ?                          -  Resume previous diet. ?                          - Continue present medications. ?                          - Await pathology results. ?                           - Repeat colonoscopy (date not yet determined) for  ?                          surveillance based on pathology results. ?Sladen Plancarte E. Candis Schatz, MD ?06/05/2021 10:11:57 AM ?This report has been signed electronically. ?

## 2021-06-05 NOTE — Progress Notes (Signed)
Called to room to assist during endoscopic procedure.  Patient ID and intended procedure confirmed with present staff. Received instructions for my participation in the procedure from the performing physician.  

## 2021-06-05 NOTE — Progress Notes (Signed)
[facility] History and Physical ? ? ?Primary Care Physician:  Barnetta Chapel, NP ? ? ?Reason for Procedure:   History of colon polyps, GERD symptoms despite therapy ? ?Plan:    Surveillance colonoscopy, EGD ? ? ? ? ?HPI: Kellie Reynolds is a 66 y.o. female undergoing surveillance colonoscopy.  She had a 10 mm SSP removed in 2017.  She has chronic typical GERD symptoms which have worsened recently despite PPI use.  No dysphagia or weight loss. ? ?Past Medical History:  ?Diagnosis Date  ? Arthritis   ? right knee  ? Family history of adverse reaction to anesthesia 35 yrs ago  ? daughter had malignant hyperthermia reaction with acentine, daughter has had surgeries since then and did welll  ? Fibrocystic breast disease   ? GERD (gastroesophageal reflux disease)   ? HTN (hypertension)   ? Hypercholesteremia   ? MVP (mitral valve prolapse)   ? Paroxysmal atrial fibrillation (HCC)   ? a. s/p PVI  ? Skin cancer   ? Sleep apnea   ? uses cpap 4 nights per week, pt does not knoe settings  ? ? ?Past Surgical History:  ?Procedure Laterality Date  ? ATRIAL FIBRILLATION ABLATION N/A 04/18/2012  ? PVI by Allred  ? COLONOSCOPY WITH PROPOFOL N/A 11/24/2015  ? Procedure: COLONOSCOPY WITH PROPOFOL;  Surgeon: Garlan Fair, MD;  Location: [facility];  Service: Endoscopy;  Laterality: N/A;  ? EYE SURGERY Bilateral   ? lasix eye surgery both eyes  ? MENISECTOMY    ? Right knee  ? [place]  ? NASAL SINUS SURGERY  1990  ? TEE WITHOUT CARDIOVERSION  04/17/2012  ? Procedure: TRANSESOPHAGEAL ECHOCARDIOGRAM (TEE);  Surgeon: Jettie Booze, MD;  Location: [facility] END[facility]COPY;  Service: Cardiovascular;  Laterality: N/A;  pre ablation  ? TUBAL LIGATION    ? ? ?Prior to Admission medications   ?Medication Sig Start Date End Date Taking? Authorizing Provider  ?acetaminophen (TYLENOL) 500 MG tablet Take 500 mg by mouth every 6 (six) hours as needed.   Yes [provider]  ?atorvastatin (LIPITOR) 20 MG tablet  Take 20 mg by mouth daily.   Yes [provider]  ?citalopram (CELEXA) 10 MG tablet Take 10 mg by mouth daily. 12/28/18  Yes [provider]  ?flecainide (TAMBOCOR) 100 MG tablet Take 0.5 tablets (50 mg total) by mouth 2 (two) times daily. 04/20/21  Yes Allred, Jeneen Rinks, MD  ?losartan (COZAAR) 50 MG tablet Take 50 mg by mouth daily. 01/01/20  Yes [provider]  ?metoprolol tartrate (LOPRESSOR) 50 MG tablet TAKE 0.5 TABLETS BY MOUTH 2 TIMES DAILY. 01/26/21  Yes Allred, Jeneen Rinks, MD  ?sodium chloride (OCEAN) 0.65 % nasal spray Place 1 spray into the nose 2 (two) times daily as needed. For dryness   Yes [provider]  ?omeprazole (PRILOSEC) 40 MG capsule Take 1 capsule (40 mg total) by mouth 2 (two) times daily. Take one capsule 20-30 minutes before meals. 04/17/21   Daryel November, MD  ?Alveda Reasons 20 MG TABS tablet TAKE 1 TABLET BY MOUTH DAILY WITH SUPPER. 01/26/21   Thompson Grayer, MD  ? ? ?Current Outpatient Medications  ?Medication Sig Dispense Refill  ? acetaminophen (TYLENOL) 500 MG tablet Take 500 mg by mouth every 6 (six) hours as needed.    ? atorvastatin (LIPITOR) 20 MG tablet Take 20 mg by mouth daily.    ? citalopram (CELEXA) 10 MG tablet Take 10 mg by mouth daily.    ?  flecainide (TAMBOCOR) 100 MG tablet Take 0.5 tablets (50 mg total) by mouth 2 (two) times daily. 90 tablet 3  ? losartan (COZAAR) 50 MG tablet Take 50 mg by mouth daily.    ? metoprolol tartrate (LOPRESSOR) 50 MG tablet TAKE 0.5 TABLETS BY MOUTH 2 TIMES DAILY. 90 tablet 3  ? sodium chloride (OCEAN) 0.65 % nasal spray Place 1 spray into the nose 2 (two) times daily as needed. For dryness    ? omeprazole (PRILOSEC) 40 MG capsule Take 1 capsule (40 mg total) by mouth 2 (two) times daily. Take one capsule 20-30 minutes before meals. 90 capsule 3  ? XARELTO 20 MG TABS tablet TAKE 1 TABLET BY MOUTH DAILY WITH SUPPER. 30 tablet 5  ? ?Current Facility-Administered Medications  ?Medication Dose Route Frequency Provider  Last Rate Last Admin  ? 0.9 %  sodium chloride infusion  500 mL Intravenous Continuous Daryel November, MD      ? ? ?Allergies as of 06/05/2021  ? (No Known Allergies)  ? ? ?Family History  ?Problem Relation Age of Onset  ? Diabetes Mother   ? Hyperlipidemia Father   ? Hypertension Father   ? CAD Father   ? Heart attack Father   ? Liver cancer Maternal Grandmother   ? Colon cancer Neg Hx   ? Esophageal cancer Neg Hx   ? Stomach cancer Neg Hx   ? Rectal cancer Neg Hx   ? ? ?Social History  ? ?Socioeconomic History  ? Marital status: Divorced  ?  Spouse name: Not on file  ? Number of children: Not on file  ? Years of education: Not on file  ? Highest education level: Not on file  ?Occupational History  ? Not on file  ?Tobacco Use  ? Smoking status: Every Day  ?  Packs/day: 1.00  ?  Types: Cigarettes  ? Smokeless tobacco: Never  ?Vaping Use  ? Vaping Use: Never used  ?Substance and Sexual Activity  ? Alcohol use: Yes  ?  Comment: 4 beers oper day, regular sized beers  ? Drug use: No  ? Sexual activity: Not on file  ?Other Topics Concern  ? Not on file  ?Social History Narrative  ? Not on file  ? ?Social Determinants of Health  ? ?Financial Resource Strain: Not on file  ?Food Insecurity: Not on file  ?Transportation Needs: Not on file  ?Physical Activity: Not on file  ?Stress: Not on file  ?Social Connections: Not on file  ?Intimate Partner Violence: Not on file  ? ? ?Review of Systems: ? ?All other review of systems negative except as mentioned in the HPI. ? ?Physical Exam: ?Vital signs ?BP (!) 121/59   Pulse 70   Temp (!) 97.3 ?F (36.3 ?C)   Ht '5\' 8"'$  (1.727 m)   Wt 180 lb (81.6 kg)   SpO2 97%   BMI 27.37 kg/m?  ? ?General:   Alert,  Well-developed, well-nourished, pleasant and cooperative in NAD ?Airway:  Mallampati 1 ?Lungs:  Clear throughout to auscultation.   ?Heart:  Regular rate and rhythm; no murmurs, clicks, rubs,  or gallops. ?Abdomen:  Soft, nontender and nondistended. Normal bowel sounds.    ?Neuro/Psych:  Normal mood and affect. A and O x 3 ? ? ?Adrain Butrick E. Candis Schatz, MD ?[facility] ? ?

## 2021-06-05 NOTE — Progress Notes (Signed)
Pt non-responsive, VVS, Report to RN  °

## 2021-06-05 NOTE — Patient Instructions (Addendum)
Handouts given for Hiatal hernia, GERD, Barrett's esophagus, diverticulosis and polyps. ? ?RESUME XARELTO TOMORROW, Saturday 06/06/21, AT YOUR USUAL DOSE. ? ?YOU HAD AN ENDOSCOPIC PROCEDURE TODAY AT [facility]:   Refer to the procedure report that was given to you for any specific questions about what was found during the examination.  If the procedure report does not answer your questions, please call your gastroenterologist to clarify.  If you requested that your care partner not be given the details of your procedure findings, then the procedure report has been included in a sealed envelope for you to review at your convenience later. ? ?YOU SHOULD EXPECT: Some feelings of bloating in the abdomen. Passage of more gas than usual.  Walking can help get rid of the air that was put into your GI tract during the procedure and reduce the bloating. If you had a lower endoscopy (such as a colonoscopy or flexible sigmoidoscopy) you may notice spotting of blood in your stool or on the toilet paper. If you underwent a bowel prep for your procedure, you may not have a normal bowel movement for a few days. ? ?Please Note:  You might notice some irritation and congestion in your nose or some drainage.  This is from the oxygen used during your procedure.  There is no need for concern and it should clear up in a day or so. ? ?SYMPTOMS TO REPORT IMMEDIATELY: ? ?Following lower endoscopy (colonoscopy): ? Excessive amounts of blood in the stool ? Significant tenderness or worsening of abdominal pains ? Swelling of the abdomen that is new, acute ? Fever of 100?F or higher ? ?Following upper endoscopy (EGD) ? Vomiting of blood or coffee ground material ? New chest pain or pain under the shoulder blades ? Painful or persistently difficult swallowing ? New shortness of breath ? Black, tarry-looking stools ? ?For urgent or emergent issues, a gastroenterologist can be reached at any hour by calling [phone]. ?Do  not use MyChart messaging for urgent concerns.  ? ? ?DIET:  We do recommend a small meal at first, but then you may proceed to your regular diet.  Drink plenty of fluids but you should avoid alcoholic beverages for 24 hours. ? ?ACTIVITY:  You should plan to take it easy for the rest of today and you should NOT DRIVE or use heavy machinery until tomorrow (because of the sedation medicines used during the test).   ? ?FOLLOW UP: ?Our staff will call the number listed on your records 48-72 hours following your procedure to check on you and address any questions or concerns that you may have regarding the information given to you following your procedure. If we do not reach you, we will leave a message.  We will attempt to reach you two times.  During this call, we will ask if you have developed any symptoms of COVID 19. If you develop any symptoms (ie: fever, flu-like symptoms, shortness of breath, cough etc.) before then, please call [phone].  If you test positive for Covid 19 in the 2 weeks post procedure, please call and report this information to [place].   ? ?If any biopsies were taken you will be contacted by phone or by letter within the next 1-3 weeks.  Please call us at [phone] if you have not heard about the biopsies in 3 weeks.  ? ? ?SIGNATURES/CONFIDENTIALITY: ?You and/or your care partner have signed paperwork which will be entered into your electronic medical record.  These  signatures attest to the fact that that the information above on your After Visit Summary has been reviewed and is understood.  Full responsibility of the confidentiality of this discharge information lies with you and/or your care-partner.  ?

## 2021-06-05 NOTE — Op Note (Signed)
[facility] ?Patient Name: Kellie Reynolds ?Procedure Date: 06/05/2021 9:11 AM ?MRN: [id number] ?Endoscopist: Jaxn Chiquito E. Candis Schatz , MD ?Age: 66 ?Referring MD:  ?Date of Birth: January 01, 1956 ?Gender: Female ?Account #: [id number] ?Procedure:                Upper GI endoscopy ?Indications:              Esophageal reflux symptoms that persist despite  ?                          appropriate therapy ?Medicines:                Monitored Anesthesia Care ?Procedure:                Pre-Anesthesia Assessment: ?                          - Prior to the procedure, a History and Physical  ?                          was performed, and patient medications and  ?                          allergies were reviewed. The patient's tolerance of  ?                          previous anesthesia was also reviewed. The risks  ?                          and benefits of the procedure and the sedation  ?                          options and risks were discussed with the patient.  ?                          All questions were answered, and informed consent  ?                          was obtained. Prior Anticoagulants: The patient has  ?                          taken Xarelto (rivaroxaban), last dose was 4 days  ?                          prior to procedure. ASA Grade Assessment: III - A  ?                          patient with severe systemic disease. After  ?                          reviewing the risks and benefits, the patient was  ?                          deemed in satisfactory condition to undergo the  ?  procedure. ?                          After obtaining informed consent, the endoscope was  ?                          passed under direct vision. Throughout the  ?                          procedure, the patient's blood pressure, pulse, and  ?                          oxygen saturations were monitored continuously. The  ?                          Endoscope was introduced through the mouth, and  ?                           advanced to the third part of duodenum. The upper  ?                          GI endoscopy was accomplished without difficulty.  ?                          The patient tolerated the procedure well. ?Scope In: ?Scope Out: ?Findings:                 The examined portions of the nasopharynx,  ?                          oropharynx and larynx were normal. ?                          There were esophageal mucosal changes suspicious  ?                          for short-segment Barrett's esophagus present in  ?                          the lower third of the esophagus. The maximum  ?                          longitudinal extent of these mucosal changes was 1  ?                          cm in length. Mucosa was biopsied with a cold  ?                          forceps for histology in 4 quadrants at 40 cm from  ?                          the incisors. One specimen bottle was sent to  ?  pathology. Estimated blood loss was minimal. ?                          The gastroesophageal flap valve was visualized  ?                          endoscopically and classified as Hill Grade IV (no  ?                          fold, wide open lumen, hiatal hernia present). ?                          The exam of the esophagus was otherwise normal. ?                          A 3 cm hiatal hernia was present. ?                          Localized moderately erythematous mucosa without  ?                          bleeding was found in the gastric body and in the  ?                          gastric antrum. Biopsies were taken with a cold  ?                          forceps for Helicobacter pylori testing. Estimated  ?                          blood loss was minimal. ?                          The exam of the stomach was otherwise normal. ?                          The examined duodenum was normal. ?Complications:            No immediate complications. ?Estimated Blood Loss:     Estimated blood loss was  minimal. ?Impression:               - The examined portions of the nasopharynx,  ?                          oropharynx and larynx were normal. ?                          - Esophageal mucosal changes suspicious for  ?                          short-segment Barrett's esophagus. Biopsied. ?                          - Gastroesophageal flap valve classified as Hill  ?  Grade IV (no fold, wide open lumen, hiatal hernia  ?                          present). ?                          - 3 cm hiatal hernia. ?                          - Erythematous mucosa in the gastric body and  ?                          antrum. Biopsied. ?                          - Normal examined duodenum. ?Recommendation:           - Patient has a contact number available for  ?                          emergencies. The signs and symptoms of potential  ?                          delayed complications were discussed with the  ?                          patient. Return to normal activities tomorrow.  ?                          Written discharge instructions were provided to the  ?                          patient. ?                          - Resume previous diet. ?                          - Continue present medications. ?                          - Await pathology results. ?Gustavo Dispenza E. Candis Schatz, MD ?06/05/2021 9:57:53 AM ?This report has been signed electronically. ?

## 2021-06-09 ENCOUNTER — Telehealth: Payer: Self-pay | Admitting: *Deleted

## 2021-06-09 NOTE — Telephone Encounter (Signed)
?  Follow up Call- ? ? ?  06/05/2021  ?  8:37 AM  ?Call back number  ?Post procedure Call Back phone  # [phone]  ?Permission to leave phone message Yes  ?  ? ?Patient questions: ? ?Do you have a fever, pain , or abdominal swelling? No. ?Pain Score  0 * ? ?Have you tolerated food without any problems? Yes.   ? ?Have you been able to return to your normal activities? Yes.   ? ?Do you have any questions about your discharge instructions: ?Diet   No. ?Medications  No. ?Follow up visit  No. ? ?Do you have questions or concerns about your Care? No. ? ?Actions: ?* If pain score is 4 or above: ?No action needed, pain <4. ? ? ?

## 2021-06-10 NOTE — Progress Notes (Signed)
Ms. Rooks,  ?Good news: the polyp (or polyps) that I removed during your recent examination were NOT precancerous.  You should continue to follow current colorectal cancer screening guidelines with a repeat colonoscopy in 10 years.   ? ?Also, the biopsies in your esophagus did not demonstrate the presence of Barrett's esophagus (intestinal metaplasia).  This means you are not at increased risk of esophageal cancer and do not require surveillance endoscopies. ? ?The biopsies taken from your stomach were notable for mild vascular ectasia which is a benign finding, but there was no evidence of Helicobacter pylori infection. This finding is not felt to necessarily be a cause of any particular symptom and there is no specific treatment or further evaluation recommended. ? ?Please follow up with me in clinic as needed for further management of your chronic upper GI symptoms. ? ?

## 2021-06-18 ENCOUNTER — Encounter: Payer: Self-pay | Admitting: Internal Medicine

## 2021-06-18 ENCOUNTER — Ambulatory Visit: Payer: BC Managed Care – PPO | Admitting: Internal Medicine

## 2021-06-18 VITALS — BP 140/70 | HR 73 | Ht 68.0 in | Wt 185.0 lb

## 2021-06-18 DIAGNOSIS — E038 Other specified hypothyroidism: Secondary | ICD-10-CM | POA: Diagnosis not present

## 2021-06-18 DIAGNOSIS — E042 Nontoxic multinodular goiter: Secondary | ICD-10-CM

## 2021-06-18 LAB — TSH: TSH: 0.31 u[IU]/mL — ABNORMAL LOW (ref 0.35–5.50)

## 2021-06-18 LAB — T4, FREE: Free T4: 0.76 ng/dL (ref 0.60–1.60)

## 2021-06-18 NOTE — Progress Notes (Signed)
? ? ?Name: Kellie Reynolds  ?MRN/ DOB: [id number], 1955-10-10    ?Age/ Sex: 66 y.o., female   ? ?PCP: Barnetta Chapel, NP   ?Reason for Endocrinology Evaluation: MNG  ?   ?Date of Initial Endocrinology Evaluation: 06/18/2021   ? ? ?HPI: ?Kellie Reynolds is a 66 y.o. female with a past medical history of MNG, A.Fib, osteoporosis and DM, Hx of breast Ca ( Left lumpectomy . The patient presented for initial endocrinology clinic visit on 06/18/2021 for consultative assistance with her Subclinical Hyperthyroidism and MNG.  ? ?Patient states that she has had diarrhea since 2020 which was started following radiation treatment for left breast cancer.  During evaluation for causes of diarrhea the patient has been noted to have multinodular goiter ? ? ?I have reviewed her GI follow-up visit 04/17/2021, and it seems like the visit was mainly focused on GERD symptoms, there is no mention of diarrhea but there has been a mention of loose stools and additional fiber was recommended ? ? ? ? ?She has occasional local neck swelling  ?Has noted weight gain rather than weight loss ?Denies tremors  ?Has occasional palpitations, A. Fib stable ( S/P cardioversion ) ?Denies prior use of Amiodarone  ?No worsening anxiety  ? ?No FH of thyroid disease  ? ? ?In review of her chart she had a Hx of thyroid uptake and scan in 2008 with a 24-hr uptake at 12%  ? ?No biotin intake  ? ?HISTORY:  ?Past Medical History:  ?Past Medical History:  ?Diagnosis Date  ? Arthritis   ? right knee  ? Family history of adverse reaction to anesthesia 35 yrs ago  ? daughter had malignant hyperthermia reaction with acentine, daughter has had surgeries since then and did welll  ? Fibrocystic breast disease   ? GERD (gastroesophageal reflux disease)   ? HTN (hypertension)   ? Hypercholesteremia   ? MVP (mitral valve prolapse)   ? Paroxysmal atrial fibrillation (HCC)   ? a. s/p PVI  ? Skin cancer   ? Sleep apnea   ? uses cpap 4 nights per week, pt does not knoe settings   ? ?Past Surgical History:  ?Past Surgical History:  ?Procedure Laterality Date  ? ATRIAL FIBRILLATION ABLATION N/A 04/18/2012  ? PVI by Allred  ? COLONOSCOPY WITH PROPOFOL N/A 11/24/2015  ? Procedure: COLONOSCOPY WITH PROPOFOL;  Surgeon: Garlan Fair, MD;  Location: [facility] ENDOSCOPY;  Service: Endoscopy;  Laterality: N/A;  ? EYE SURGERY Bilateral   ? lasix eye surgery both eyes  ? MENISECTOMY    ? Right knee  ? [place]  ? NASAL SINUS SURGERY  1990  ? TEE WITHOUT CARDIOVERSION  04/17/2012  ? Procedure: TRANSESOPHAGEAL ECHOCARDIOGRAM (TEE);  Surgeon: Jettie Booze, MD;  Location: [facility] ENDOSCOPY;  Service: Cardiovascular;  Laterality: N/A;  pre ablation  ? TUBAL LIGATION    ?  ?Social History:  reports that she has been smoking cigarettes. She has been smoking an average of 1 pack per day. She has never used smokeless tobacco. She reports current alcohol use. She reports that she does not use drugs. ?Family History: family history includes CAD in her father; Diabetes in her mother; Heart attack in her father; Hyperlipidemia in her father; Hypertension in her father; Liver cancer in her maternal grandmother. ? ? ?HOME MEDICATIONS: ?Allergies as of 06/18/2021   ? ?   Reactions  ? Anastrozole Other (See Comments)  ? Latex Other (See Comments),  Rash  ? Elastic in underwear  ? ?  ? ?  ?Medication List  ?  ? ?  ? Accurate as of June 18, 2021  1:49 PM. If you have any questions, ask your nurse or doctor.  ?  ?  ? ?  ? ?STOP taking these medications   ? ?triamcinolone cream 0.1 % ?Commonly known as: KENALOG ?Stopped by: Dorita Sciara, MD ?  ? ?  ? ?TAKE these medications   ? ?acetaminophen 500 MG tablet ?Commonly known as: TYLENOL ?Take 500 mg by mouth every 6 (six) hours as needed. ?  ?alendronate 70 MG tablet ?Commonly known as: FOSAMAX ?PLEASE SEE ATTACHED FOR DETAILED DIRECTIONS ?  ?atorvastatin 20 MG tablet ?Commonly known as: LIPITOR ?Take 20 mg by mouth daily. ?  ?citalopram 10 MG  tablet ?Commonly known as: CELEXA ?Take 10 mg by mouth daily. ?  ?flecainide 100 MG tablet ?Commonly known as: TAMBOCOR ?Take 0.5 tablets (50 mg total) by mouth 2 (two) times daily. ?  ?letrozole 2.5 MG tablet ?Commonly known as: [place] ?Take 2.5 mg by mouth daily. ?  ?losartan 50 MG tablet ?Commonly known as: COZAAR ?Take 50 mg by mouth daily. ?  ?metoprolol tartrate 50 MG tablet ?Commonly known as: LOPRESSOR ?TAKE 0.5 TABLETS BY MOUTH 2 TIMES DAILY. ?  ?omeprazole 40 MG capsule ?Commonly known as: PRILOSEC ?Take 1 capsule (40 mg total) by mouth 2 (two) times daily. Take one capsule 20-30 minutes before meals. ?  ?sodium chloride 0.65 % nasal spray ?Commonly known as: OCEAN ?Place 1 spray into the nose 2 (two) times daily as needed. For dryness ?  ?Vitamin D3 1.25 MG (50000 UT) Caps ?Take 1 capsule by mouth once a week. ?  ?Xarelto 20 MG Tabs tablet ?Generic drug: rivaroxaban ?TAKE 1 TABLET BY MOUTH DAILY WITH SUPPER. ?  ? ?  ?  ? ? ?REVIEW OF SYSTEMS: ?A comprehensive ROS was conducted with the patient and is negative except as per HPI and below:  ?ROS  ? ? ? ?OBJECTIVE:  ?VS: BP 140/70 (BP Location: Left Arm, Patient Position: Sitting, Cuff Size: Small)   Pulse 73   Ht '5\' 8"'$  (1.727 m)   Wt 185 lb (83.9 kg)   SpO2 96%   BMI 28.13 kg/m?   ? ?Wt Readings from Last 3 Encounters:  ?06/18/21 185 lb (83.9 kg)  ?06/05/21 180 lb (81.6 kg)  ?04/17/21 180 lb (81.6 kg)  ? ? ? ?EXAM: ?General: Pt appears well and is in NAD  ?Hydration: Well-hydrated with moist mucous membranes and good skin turgor  ?Eyes: External eye exam normal without stare, lid lag or exophthalmos.  EOM intact.  PERRL.  ?Ears, Nose, Throat: Hearing: Grossly intact bilaterally ?Dental: Good dentition  ?Throat: Clear without mass, erythema or exudate  ?Neck: General: Supple without adenopathy. ?Thyroid: Thyroid size normal.  No goiter or nodules appreciated. No thyroid bruit.  ?Lungs: Clear with good BS bilat with no rales, rhonchi, or wheezes  ?Heart:  Auscultation: RRR.  ?Abdomen: Normoactive bowel sounds, soft, nontender, without masses or organomegaly palpable  ?Extremities: Gait and station: Normal gait  ?Digits and nails: No clubbing, cyanosis, petechiae, or nodes ?Head and neck: Normal alignment and mobility ?BL UE: Normal ROM and strength. ?BL LE: No pretibial edema normal ROM and strength.  ?Skin: Hair: Texture and amount normal with gender appropriate distribution ?Skin Inspection: No rashes, acanthosis nigricans/skin tags. No lipohypertrophy ?Skin Palpation: Skin temperature, texture, and thickness normal to palpation  ?Neuro: Cranial nerves:  II - XII grossly intact  ?Cerebellar: Normal coordination and movement; no tremor ?Motor: Normal strength throughout ?DTRs: 2+ and symmetric in UE without delay in relaxation phase  ?Mental Status: Judgment, insight: Intact ?Orientation: Oriented to time, place, and person ?Memory: Intact for recent and remote events ?Mood and affect: No depression, anxiety, or agitation  ? ? ? ?DATA REVIEWED: ? ?  ? Latest Reference Range & Units 06/18/21 12:22  ?TSH 0.35 - 5.50 uIU/mL 0.31 (L)  ?Triiodothyronine (T3) 76 - 181 ng/dL 97  ?T4,Free(Direct) 0.60 - 1.60 ng/dL 0.76  ? ? ? ?Thyroid ultrasound 03/25/2021 ?Estimated total number of nodules >/= 1 cm: 3 ?  ?Number of spongiform nodules >/=  2 cm not described below (TR1): 0 ?  ?Number of mixed cystic and solid nodules >/= 1.5 cm not described ?below ([place]): 0 ?  ?_________________________________________________________ ?  ?Nodule # 1: ?  ?Location: Right; Mid ?  ?Maximum size: 1.1 cm; Other 2 dimensions: 0.8 x 1.1 cm ?  ?Composition: solid/almost completely solid (2) ?  ?Echogenicity: hypoechoic (2) ?  ?Shape: not taller-than-wide (0) ?  ?Margins: smooth (0) ?  ?Echogenic foci: macrocalcifications (1) ?  ?ACR TI-RADS total points: 5. ?  ?ACR TI-RADS risk category: TR4 (4-6 points). ?  ?ACR TI-RADS recommendations: ?  ?*Given size (>/= 1 - 1.4 cm) and appearance, a follow-up  ultrasound ?in 1 year should be considered based on TI-RADS criteria. ?  ?_________________________________________________________ ?  ?Nodule # 2: ?  ?Location: Right; Mid ?  ?Maximum size: 1.9 cm; Other 2 dimensions: 0

## 2021-06-19 LAB — T3: T3, Total: 97 ng/dL (ref 76–181)

## 2021-06-22 DIAGNOSIS — E038 Other specified hypothyroidism: Secondary | ICD-10-CM | POA: Insufficient documentation

## 2021-06-22 DIAGNOSIS — E042 Nontoxic multinodular goiter: Secondary | ICD-10-CM | POA: Insufficient documentation

## 2021-06-22 MED ORDER — METHIMAZOLE 5 MG PO TABS: 2.5000 mg | ORAL_TABLET | Freq: Every day | ORAL | 1 refills | Status: DC

## 2021-06-26 DIAGNOSIS — F419 Anxiety disorder, unspecified: Secondary | ICD-10-CM | POA: Diagnosis not present

## 2021-06-26 DIAGNOSIS — R5383 Other fatigue: Secondary | ICD-10-CM | POA: Diagnosis not present

## 2021-06-26 DIAGNOSIS — Z6827 Body mass index (BMI) 27.0-27.9, adult: Secondary | ICD-10-CM | POA: Diagnosis not present

## 2021-06-26 DIAGNOSIS — E7849 Other hyperlipidemia: Secondary | ICD-10-CM | POA: Diagnosis not present

## 2021-06-26 DIAGNOSIS — I10 Essential (primary) hypertension: Secondary | ICD-10-CM | POA: Diagnosis not present

## 2021-06-26 DIAGNOSIS — M25551 Pain in right hip: Secondary | ICD-10-CM | POA: Diagnosis not present

## 2021-07-02 ENCOUNTER — Ambulatory Visit: Payer: BC Managed Care – PPO | Admitting: Internal Medicine

## 2021-07-30 ENCOUNTER — Other Ambulatory Visit: Payer: Self-pay | Admitting: Internal Medicine

## 2021-07-30 NOTE — Telephone Encounter (Signed)
Prescription refill request for Xarelto received.  Indication: PAF Last office visit:  01/30/21  Clearnce Hasten MD Weight: 80.2kg Age: 66 Scr: 0.63 on 01/01/21 CrCl: 112.71  Based on above findings Xarelto '20mg'$  daily is the appropriate dose.  Refill approved.

## 2021-08-25 ENCOUNTER — Other Ambulatory Visit: Payer: Self-pay | Admitting: Gastroenterology

## 2021-09-30 ENCOUNTER — Telehealth: Payer: Self-pay | Admitting: Internal Medicine

## 2021-09-30 DIAGNOSIS — I480 Paroxysmal atrial fibrillation: Secondary | ICD-10-CM

## 2021-09-30 NOTE — Telephone Encounter (Signed)
Patient states her PCP is requesting that she have a BNP due to fluid buildup around her heart and she would like to have an order placed if possible. Please assist.

## 2021-10-01 ENCOUNTER — Other Ambulatory Visit: Payer: Self-pay | Admitting: Internal Medicine

## 2021-10-01 NOTE — Telephone Encounter (Signed)
Left message requesting call back.  Need to clarify request.

## 2021-10-03 ENCOUNTER — Emergency Department (HOSPITAL_COMMUNITY)
Admission: EM | Admit: 2021-10-03 | Discharge: 2021-10-03 | Disposition: A | Discharge status: Home / Self Care | Payer: Medicare HMO | Attending: Emergency Medicine | Admitting: Emergency Medicine

## 2021-10-03 ENCOUNTER — Other Ambulatory Visit: Payer: Self-pay

## 2021-10-03 ENCOUNTER — Emergency Department (HOSPITAL_COMMUNITY): Payer: Medicare HMO

## 2021-10-03 ENCOUNTER — Encounter (HOSPITAL_COMMUNITY): Payer: Self-pay

## 2021-10-03 DIAGNOSIS — R0602 Shortness of breath: Secondary | ICD-10-CM | POA: Diagnosis not present

## 2021-10-03 DIAGNOSIS — R1084 Generalized abdominal pain: Secondary | ICD-10-CM | POA: Insufficient documentation

## 2021-10-03 DIAGNOSIS — D509 Iron deficiency anemia, unspecified: Secondary | ICD-10-CM | POA: Insufficient documentation

## 2021-10-03 DIAGNOSIS — Z91040 Latex allergy status: Secondary | ICD-10-CM | POA: Insufficient documentation

## 2021-10-03 DIAGNOSIS — R109 Unspecified abdominal pain: Secondary | ICD-10-CM | POA: Diagnosis present

## 2021-10-03 DIAGNOSIS — I4891 Unspecified atrial fibrillation: Secondary | ICD-10-CM | POA: Diagnosis not present

## 2021-10-03 DIAGNOSIS — Z20822 Contact with and (suspected) exposure to covid-19: Secondary | ICD-10-CM | POA: Diagnosis not present

## 2021-10-03 DIAGNOSIS — Z7901 Long term (current) use of anticoagulants: Secondary | ICD-10-CM | POA: Diagnosis not present

## 2021-10-03 DIAGNOSIS — I10 Essential (primary) hypertension: Secondary | ICD-10-CM | POA: Diagnosis not present

## 2021-10-03 LAB — URINALYSIS, ROUTINE W REFLEX MICROSCOPIC
Bacteria, UA: NONE SEEN
Bilirubin Urine: NEGATIVE
Glucose, UA: NEGATIVE mg/dL
Hgb urine dipstick: NEGATIVE
Ketones, ur: NEGATIVE mg/dL
Leukocytes,Ua: NEGATIVE
Nitrite: NEGATIVE
Protein, ur: 30 mg/dL — AB
Specific Gravity, Urine: 1.013 (ref 1.005–1.030)
pH: 7 (ref 5.0–8.0)

## 2021-10-03 LAB — PROTIME-INR
INR: 1.8 — ABNORMAL HIGH (ref 0.8–1.2)
Prothrombin Time: 20.4 seconds — ABNORMAL HIGH (ref 11.4–15.2)

## 2021-10-03 LAB — RESP PANEL BY RT-PCR (FLU A&B, COVID) ARPGX2
Influenza A by PCR: NEGATIVE
Influenza B by PCR: NEGATIVE
SARS Coronavirus 2 by RT PCR: NEGATIVE

## 2021-10-03 LAB — CBC WITH DIFFERENTIAL/PLATELET
Abs Immature Granulocytes: 0.04 10*3/uL (ref 0.00–0.07)
Basophils Absolute: 0 10*3/uL (ref 0.0–0.1)
Basophils Relative: 0 %
Eosinophils Absolute: 0 10*3/uL (ref 0.0–0.5)
Eosinophils Relative: 0 %
HCT: 26.4 % — ABNORMAL LOW (ref 36.0–46.0)
Hemoglobin: 7.8 g/dL — ABNORMAL LOW (ref 12.0–15.0)
Immature Granulocytes: 0 %
Lymphocytes Relative: 8 %
Lymphs Abs: 0.8 10*3/uL (ref 0.7–4.0)
MCH: 22.3 pg — ABNORMAL LOW (ref 26.0–34.0)
MCHC: 29.5 g/dL — ABNORMAL LOW (ref 30.0–36.0)
MCV: 75.4 fL — ABNORMAL LOW (ref 80.0–100.0)
Monocytes Absolute: 1.1 10*3/uL — ABNORMAL HIGH (ref 0.1–1.0)
Monocytes Relative: 12 %
Neutro Abs: 7.4 10*3/uL (ref 1.7–7.7)
Neutrophils Relative %: 80 %
Platelets: 260 10*3/uL (ref 150–400)
RBC: 3.5 MIL/uL — ABNORMAL LOW (ref 3.87–5.11)
RDW: 16.2 % — ABNORMAL HIGH (ref 11.5–15.5)
WBC: 9.3 10*3/uL (ref 4.0–10.5)
nRBC: 0 % (ref 0.0–0.2)

## 2021-10-03 LAB — COMPREHENSIVE METABOLIC PANEL
ALT: 10 U/L (ref 0–44)
AST: 14 U/L — ABNORMAL LOW (ref 15–41)
Albumin: 3.7 g/dL (ref 3.5–5.0)
Alkaline Phosphatase: 59 U/L (ref 38–126)
Anion gap: 12 (ref 5–15)
BUN: 6 mg/dL — ABNORMAL LOW (ref 8–23)
CO2: 24 mmol/L (ref 22–32)
Calcium: 8.5 mg/dL — ABNORMAL LOW (ref 8.9–10.3)
Chloride: 98 mmol/L (ref 98–111)
Creatinine, Ser: 0.75 mg/dL (ref 0.44–1.00)
GFR, Estimated: >60 mL/min (ref >60)
Glucose, Bld: 185 mg/dL — ABNORMAL HIGH (ref 70–99)
Potassium: 3.4 mmol/L — ABNORMAL LOW (ref 3.5–5.1)
Sodium: 134 mmol/L — ABNORMAL LOW (ref 135–145)
Total Bilirubin: 1 mg/dL (ref 0.3–1.2)
Total Protein: 7.5 g/dL (ref 6.5–8.1)

## 2021-10-03 LAB — BRAIN NATRIURETIC PEPTIDE: B Natriuretic Peptide: 381.7 pg/mL — ABNORMAL HIGH (ref 0.0–100.0)

## 2021-10-03 LAB — LACTIC ACID, PLASMA
Lactic Acid, Venous: 1.9 mmol/L (ref 0.5–1.9)
Lactic Acid, Venous: 1 mmol/L (ref 0.5–1.9)

## 2021-10-03 MED ORDER — LACTATED RINGERS IV BOLUS
1000.0000 mL | Freq: Once | INTRAVENOUS | Status: AC
  Administered 2021-10-03: 1000 mL via INTRAVENOUS

## 2021-10-03 MED ORDER — ACETAMINOPHEN 325 MG PO TABS
650.0000 mg | ORAL_TABLET | Freq: Once | ORAL | Status: AC
  Administered 2021-10-03: 650 mg via ORAL
  Filled 2021-10-03: qty 2

## 2021-10-03 NOTE — ED Notes (Signed)
Pt was put on the bedpan but was unable to urinate.

## 2021-10-03 NOTE — ED Notes (Signed)
Pt was ambulated around the room, her oxygen stayed at 97.

## 2021-10-03 NOTE — Discharge Instructions (Signed)
The testing today was reassuring.  There were no serious findings.  If you are still feeling uncomfortable after a couple of days of rest, call your PCP for a follow-up appointment.  You can also return here as needed for problems.

## 2021-10-03 NOTE — ED Provider Notes (Signed)
[facility] DEPT Provider Note   CSN: [id number] Arrival date & time: 10/03/21  5397     History  Chief Complaint  Patient presents with   Abnormal Lab   Fever    Kellie Reynolds is a 66 y.o. female.  HPI Patient presents for evaluation of abdominal pain with a sensation of bloating.  She has also had intermittent left foot swelling.  Her cardiologist ordered a BNP, which was apparently checked by her PCP and found to be elevated.  These results are not available in the EMR.  Patient states that she had fever and chills last night but did not take her temperature.  She has been coughing for several days and producing white mucus with coughing.  She denies vomiting, dysuria, constipation or diarrhea.    Home Medications Prior to Admission medications   Medication Sig Start Date End Date Taking? Authorizing Provider  acetaminophen (TYLENOL) 500 MG tablet Take 1,500 mg by mouth 3 (three) times daily as needed for headache.   Yes [provider]  alendronate (FOSAMAX) 70 MG tablet Take 70 mg by mouth once a week. 05/04/21  Yes [provider]  atorvastatin (LIPITOR) 20 MG tablet Take 20 mg by mouth daily.   Yes [provider]  citalopram (CELEXA) 10 MG tablet Take 10 mg by mouth daily. 12/28/18  Yes [provider]  flecainide (TAMBOCOR) 100 MG tablet Take 0.5 tablets (50 mg total) by mouth 2 (two) times daily. 04/20/21  Yes Allred, Jeneen Rinks, MD  guaiFENesin (MUCINEX) 600 MG 12 hr tablet Take 600 mg by mouth daily as needed (allergies).   Yes [provider]  letrozole (FEMARA) 2.5 MG tablet Take 2.5 mg by mouth daily. 04/24/21  Yes [provider]  losartan (COZAAR) 50 MG tablet Take 50 mg by mouth in the morning and at bedtime. 01/01/20  Yes [provider]  meloxicam (MOBIC) 15 MG tablet Take 15 mg by mouth daily. 09/21/21  Yes [provider]  methimazole (TAPAZOLE) 5 MG tablet TAKE 1/2  TABLET BY MOUTH DAILY 10/01/21  Yes Shamleffer, Melanie Crazier, MD  metoprolol tartrate (LOPRESSOR) 50 MG tablet TAKE 0.5 TABLETS BY MOUTH 2 TIMES DAILY. Patient taking differently: Take 25 mg by mouth 2 (two) times daily. 01/26/21  Yes Allred, Jeneen Rinks, MD  omeprazole (PRILOSEC) 40 MG capsule TAKE 1 CAPSULE BY MOUTH 2 (TWO) TIMES DAILY 20-30 MINUTES BEFORE MEALS. Patient taking differently: Take 40 mg by mouth in the morning and at bedtime. 08/25/21  Yes Daryel November, MD  sodium chloride (OCEAN) 0.65 % nasal spray Place 1 spray into the nose 2 (two) times daily as needed. For dryness   Yes [provider]  XARELTO 20 MG TABS tablet TAKE 1 TABLET BY MOUTH DAILY WITH SUPPER Patient taking differently: Take 20 mg by mouth daily with supper. 07/30/21  Yes Allred, Jeneen Rinks, MD  Cholecalciferol (VITAMIN D3) 1.25 MG (50000 UT) CAPS Take 1 capsule by mouth once a week. Patient not taking: Reported on 10/03/2021 02/25/21   [provider]      Allergies    Anastrozole and Latex    Review of Systems   Review of Systems  Physical Exam Updated Vital Signs BP 115/75 (BP Location: Left Arm)   Pulse 95   Temp 98 F (36.7 C) (Oral)   Resp (!) 35   Ht '5\' 8"'$  (1.727 m)   Wt 86.6 kg   SpO2 96%   BMI 29.04 kg/m  Physical  Exam  ED Results / Procedures / Treatments   Labs (all labs ordered are listed, but only abnormal results are displayed) Labs Reviewed  COMPREHENSIVE METABOLIC PANEL - Abnormal; Notable for the following components:      Result Value   Sodium 134 (*)    Potassium 3.4 (*)    Glucose, Bld 185 (*)    BUN 6 (*)    Calcium 8.5 (*)    AST 14 (*)    All other components within normal limits  CBC WITH DIFFERENTIAL/PLATELET - Abnormal; Notable for the following components:   RBC 3.50 (*)    Hemoglobin 7.8 (*)    HCT 26.4 (*)    MCV 75.4 (*)    MCH 22.3 (*)    MCHC 29.5 (*)    RDW 16.2 (*)    Monocytes Absolute 1.1 (*)    All other components within normal  limits  PROTIME-INR - Abnormal; Notable for the following components:   Prothrombin Time 20.4 (*)    INR 1.8 (*)    All other components within normal limits  BRAIN NATRIURETIC PEPTIDE - Abnormal; Notable for the following components:   B Natriuretic Peptide 381.7 (*)    All other components within normal limits  RESP PANEL BY RT-PCR (FLU A&B, COVID) ARPGX2  CULTURE, BLOOD (ROUTINE X 2)  CULTURE, BLOOD (ROUTINE X 2)  LACTIC ACID, PLASMA  LACTIC ACID, PLASMA  URINALYSIS, ROUTINE W REFLEX MICROSCOPIC    EKG EKG Interpretation  Date/Time:  Saturday October 03 2021 10:41:31 EDT Ventricular Rate:  69 PR Interval:  184 QRS Duration: 88 QT Interval:  411 QTC Calculation: 441 R Axis:   51 Text Interpretation: Sinus rhythm since last tracing no significant change Confirmed by Daleen Bo [phone]) on 10/03/2021 10:47:09 AM  Radiology DG Chest Port 1 View  Result Date: 10/03/2021 CLINICAL DATA:  Suspected sepsis. Shortness of breath several days. Hypertension and AFib. EXAM: PORTABLE CHEST 1 VIEW COMPARISON:  11/28/2017 FINDINGS: Patient slightly rotated to the left. Lungs are adequately inflated without focal airspace consolidation or effusion. Cardiomediastinal silhouette is unremarkable. No focal bony abnormality. IMPRESSION: No acute cardiopulmonary disease. Electronically Signed   By: Marin Olp M.D.   On: 10/03/2021 10:15    Procedures Procedures    Medications Ordered in ED Medications  acetaminophen (TYLENOL) tablet 650 mg (650 mg Oral Given 10/03/21 1049)  lactated ringers bolus 1,000 mL (0 mLs Intravenous Stopped 10/03/21 1206)    ED Course/ Medical Decision Making/ A&P Clinical Course as of 10/03/21 1727  Sat Oct 03, 2021  1524 At this time she is on room air with an oxygen saturation of 95%.  She denies shortness of breath, cough or weakness at this time.  She reiterates that she does not have any urinary tract symptoms. [EW]    Clinical Course User Index [EW] Daleen Bo, MD                           Medical Decision Making Patient with a history of atrial fibrillation, on flecainide and Xarelto, presenting with abdominal pain.  She also has a history of breast cancer, and is on chronic suppressive therapy for that.  She has abdominal bloating without specific GI symptoms.  She has complained of left leg pain but no swelling on initial evaluation.  She requires evaluation for intra-abdominal disorders including infection, bowel blockage, metabolic disorders and acute cardiac disease.  Patient febrile and hypotensive on arrival.  She has had a cough productive of white-colored sputum Will screen for infectious processes.  Amount and/or Complexity of Data Reviewed Independent Historian:     Details: She is a cogent historian Labs: ordered.    Details: Lactic acid, viral panel, BNP, metabolic panel, CBC -- normal except hemoglobin low, MCV low-sodium low, potassium low, glucose high, calcium low, BNP high, INR high Radiology: ordered.    Details: Chest x-ray -- no infiltrate or edema  Risk OTC drugs. Decision regarding hospitalization. Risk Details: Patient presenting with nonspecific abdominal pain by clinical history.  Initially she was hypoxic however after period of observation, she was no longer hypoxic and had no ongoing respiratory distress.  She does not have chest pain.  No etiology found for abdominal discomfort.  Doubt UTI, colitis, appendicitis.  Suspect nonspecific viral process.  At the time of disposition she had mild tachypnea and tachycardia.  She is a smoker and states she stopped smoking several days ago.  Doubt COPD exacerbation, PE or pneumonia.  Incidental anemia, suspected to be from iron deficiency.  Patient does not have a known source of bleeding.  Patient comfortable at discharge, ambulating without symptoms.  She is instructed to follow-up with her PCP for checkup next week and to discuss with them her anemia and receive further care  as needed.           Final Clinical Impression(s) / ED Diagnoses Final diagnoses:  Generalized abdominal pain  Iron deficiency anemia, unspecified iron deficiency anemia type    Rx / DC Orders ED Discharge Orders     None         Daleen Bo, MD 10/03/21 1728

## 2021-10-03 NOTE — ED Triage Notes (Signed)
Pt reports recent abdominal bloating and nausea. Pt also endorses left foot swelling. Pt reports she had bloodwork down on Wednesday and was told that her BNP is elevated. Pt is febrile, tachycardiac, and hypotensive in triage.

## 2021-10-05 NOTE — Telephone Encounter (Signed)
Pt with ER visit on 10/03/2021.  Lab work obtained at that visit.  Pt to f/u with PCP per documentation.  Await further needs.

## 2021-10-05 NOTE — Telephone Encounter (Signed)
Pt calling back stating that she followed up with her PCP and they recommended an ultrasound to check for fluid around her heart

## 2021-10-07 NOTE — Addendum Note (Signed)
Addended by: Antonieta Iba on: 10/07/2021 08:25 AM   Modules accepted: Orders

## 2021-10-07 NOTE — Telephone Encounter (Signed)
Per Cristopher Peru, okay to order and echocardiogram. Echo has been ordered and patient is aware.

## 2021-10-08 LAB — CULTURE, BLOOD (ROUTINE X 2)
Culture: NO GROWTH
Special Requests: ADEQUATE

## 2021-10-13 ENCOUNTER — Telehealth: Payer: Self-pay | Admitting: Gastroenterology

## 2021-10-13 NOTE — Telephone Encounter (Signed)
Pt was seen in the ER recently for fever cough and found to have low Hgb. Pt has hx of IDA. PCP has placed her on Iron pills but levels do not seem to be coming up quick. Pt report she has dark stools, explained the iron will make you have dark stools. Pt scheduled to see Vicie Mutters PA 10/23/21 at 1:30pm. Pt aware of appt.

## 2021-10-13 NOTE — Telephone Encounter (Signed)
Inbound call from patient stating that she had a physical in June or July and her iron was dropping. Patient also stated that she is still having issues with diarrhea and it is now looking like a black color and sometimes has blood when she wipes. Patient is requesting a call back to discuss. Please advise.

## 2021-10-14 ENCOUNTER — Other Ambulatory Visit (HOSPITAL_COMMUNITY): Payer: Self-pay | Admitting: Family Medicine

## 2021-10-14 ENCOUNTER — Other Ambulatory Visit: Payer: Self-pay | Admitting: Family Medicine

## 2021-10-14 DIAGNOSIS — R339 Retention of urine, unspecified: Secondary | ICD-10-CM

## 2021-10-14 DIAGNOSIS — R0602 Shortness of breath: Secondary | ICD-10-CM

## 2021-10-14 DIAGNOSIS — R140 Abdominal distension (gaseous): Secondary | ICD-10-CM

## 2021-10-21 ENCOUNTER — Ambulatory Visit (HOSPITAL_COMMUNITY): Payer: Medicare HMO

## 2021-10-21 ENCOUNTER — Encounter (HOSPITAL_COMMUNITY): Payer: Self-pay

## 2021-10-22 NOTE — Progress Notes (Signed)
10/23/2021 Kellie Reynolds [id number] 06/28/1955  Referring provider: Barnetta Chapel, NP Primary GI doctor: Dr. Candis Schatz  ASSESSMENT AND PLAN:   Assessment: 66 y.o. female here for assessment of the following: 1. Iron deficiency anemia due to chronic blood loss   2. Melena   3. Malignant neoplasm of upper-outer quadrant of left breast in female, estrogen receptor positive (HCC)   4. Paroxysmal atrial fibrillation ([place])   5. Gastroesophageal reflux disease without esophagitis   6. History of adenomatous polyp of colon     66 year old female with history of left breast cancer 2022 status postlumpectomy with hematoma left breast, A-fib on Xarelto, follows with Dr. Rayann Heman last dose yesterday evening presents with melena and IDA. Patient had EGD and colonoscopy with Dr. 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  Endoscopy showed 3 cm hiatal hernia, gastritis otherwise unremarkable. Patient having worsening melena with associated fatigue and shortness of breath, presented to the ER found to have hemoglobin 7.8 with microcytosis 75.4 patient's INR 1.8 on Xarelto, normal platelets 260. Since that time patient's been on iron once daily, continues with shortness of breath, denies chest pain, dizziness. Patient had serial CBCs with PCP last 1 yesterday unable to see these records, per patient has had continuing decrease of hemoglobin. Hemoccult in the office with brown stool, very faintly positive .  Plan: -Stat CBC, iron today. -If CBC comes back below 7 patient will be advised to go to the ER for transfusion, in the meantime can increase iron to twice daily. -will see if we can to do supportive care outpatient versus inpatient. -Advised to stop Mobic/meloxicam/all NSAIDS -Advised to hold Xarelto just for today. -We will plan for capsule endoscopy to see if we can identify area of bleeding within the small bowel that we would be able to  reach with endoscopy. -Patient has history of breast cancer 08/2020, abdominal distention, pending CT chest abdomen pelvis from PCP, agree  -Strict ER precautions for worsening shortness of breath, chest pain, abdominal pain, dizziness, weakness.  Orders Placed This Encounter  Procedures   CBC with Differential/Platelet   IBC + Ferritin    Patient Care Team: Barnetta Chapel, NP as PCP - General (Family Medicine) Thompson Grayer, MD as PCP - Cardiology (Cardiology)   History of Present Illness:  66 y.o. female  with a past medical history of atrial fibrillation on Xarelto, history of breast cancer left breast estrogen + 08/14/2020 status postlumpectomy with hematoma of left breast and others listed below, returns to clinic today for evaluation of IDA, dark stools.  06/05/2021 colonoscopy and EGD with Dr. Candis Schatz, good bowel prep 3 mm polyp sigmoid colon flat, diverticula, and angiodysplastic lesion without bleeding in cecum-benign polyp recall 10 years Endoscopy showed mucosal changes suspicious for Barrett's, Hill grade 4, 3 cm hiatal hernia, gastritis, normal duodenum.  Pathology negative for Barrett's esophagus negative H. pylori gastritis.  She has kept loose stools for months, even with metamucil.  Has had darker stools for 2-3 months, worse black stools with continuing diarrhea since may/June.  Can have diarrhea up to 6 times small volume before she leaves for the house, can have black stools, rare BRB. She states if she urinates or strains at all will have small volume stools.  She states with BM can have nausea, can have vomiting with this, has happened once a week. No blood in vomit.  Feels AB distention,  Rare RUQ pain lasts seconds, not associated with anything.  She has SOB with exertion, denies chest pain. No dizziness, has fatigue.  She has been on the iron since the ER, takes one pills a day.   09/30/2020 PCP with fatigue, has low CBC then.  10/03/2021 ER presentation  with abdominal pain, abdominal bloating , and fatigued. Found to have hemoglobin 7.8, MCV 75.4, platelets 260 had been low previously, normal white blood cell count INR 1.8 Patient she has been following up with PCP for CBC weekly, I do not have these records. She is on an iron supplement.   She has scheduled CT chest AB and pelvis for SOB, AB distension and urinary retention, has appointment upcoming end of this month.   Patient is on Fosamax x 1 year, Xarelto.  Current Medications:   Current Outpatient Medications (Endocrine & Metabolic):    alendronate (FOSAMAX) 70 MG tablet, Take 70 mg by mouth once a week.   methimazole (TAPAZOLE) 5 MG tablet, TAKE 1/2 TABLET BY MOUTH DAILY  Current Outpatient Medications (Cardiovascular):    atorvastatin (LIPITOR) 20 MG tablet, Take 20 mg by mouth daily.   flecainide (TAMBOCOR) 100 MG tablet, Take 0.5 tablets (50 mg total) by mouth 2 (two) times daily.   losartan (COZAAR) 50 MG tablet, Take 50 mg by mouth in the morning and at bedtime.   metoprolol tartrate (LOPRESSOR) 50 MG tablet, TAKE 0.5 TABLETS BY MOUTH 2 TIMES DAILY. (Patient taking differently: Take 25 mg by mouth 2 (two) times daily.)  Current Outpatient Medications (Respiratory):    guaiFENesin (MUCINEX) 600 MG 12 hr tablet, Take 600 mg by mouth daily as needed (allergies).   sodium chloride (OCEAN) 0.65 % nasal spray, Place 1 spray into the nose 2 (two) times daily as needed. For dryness  Current Outpatient Medications (Analgesics):    acetaminophen (TYLENOL) 500 MG tablet, Take 1,500 mg by mouth 3 (three) times daily as needed for headache.   meloxicam (MOBIC) 15 MG tablet, Take 15 mg by mouth daily.  Current Outpatient Medications (Hematological):    ferrous sulfate 325 (65 FE) MG tablet, Take 325 mg by mouth daily.   XARELTO 20 MG TABS tablet, TAKE 1 TABLET BY MOUTH DAILY WITH SUPPER (Patient taking differently: Take 20 mg by mouth daily with supper.)  Current Outpatient Medications  (Other):    Cholecalciferol (VITAMIN D3) 1.25 MG (50000 UT) CAPS, Take 1 capsule by mouth once a week.   citalopram (CELEXA) 10 MG tablet, Take 10 mg by mouth daily.   letrozole (FEMARA) 2.5 MG tablet, Take 2.5 mg by mouth daily.   omeprazole (PRILOSEC) 40 MG capsule, TAKE 1 CAPSULE BY MOUTH 2 (TWO) TIMES DAILY 20-30 MINUTES BEFORE MEALS. (Patient taking differently: Take 40 mg by mouth in the morning and at bedtime.)  Surgical History:  She  has a past surgical history that includes Tubal ligation; Menisectomy; Nasal sinus surgery (1990); Nasal polyp surgery (1990); TEE without cardioversion (04/17/2012); atrial fibrillation ablation (N/A, 04/18/2012); Eye surgery (Bilateral); and Colonoscopy with propofol (N/A, 11/24/2015). Family History:  Her family history includes CAD in her father; Diabetes in her mother; Heart attack in her father; Hyperlipidemia in her father; Hypertension in her father; Liver cancer in her maternal grandmother. Social History:   reports that she has been smoking cigarettes. She has been smoking an average of 1 pack per day. She has never used smokeless tobacco. She reports current alcohol use. She reports that she does not use drugs.  Current Medications, Allergies, Past Medical History, Past Surgical History, Family History and Social  History were reviewed in [facility] record.  Physical Exam: BP 114/60 (BP Location: Left Arm, Patient Position: Sitting, Cuff Size: Normal)   Pulse 68   Ht '5\' 7"'$  (1.702 m) Comment: height measured without shoes  Wt 187 lb 4 oz (84.9 kg)   BMI 29.33 kg/m  General:   Pleasant, well developed female in no acute distress Heart : Regular rate and rhythm; no murmurs Pulm: Clear anteriorly; no wheezing Abdomen:  Soft, Obese AB, Active bowel sounds. No tenderness . Without guarding and Without rebound, No organomegaly appreciated. Rectal: Normal external rectal exam, normal rectal tone, no internal hemorrhoids  appreciated, no masses, non tender, brown stool, hemoccult very slightly positive Extremities:  with  edema non pitting bilateral, left worse slightly than right.  Neurologic:  Alert and  oriented x4;  No focal deficits.  Psych:  Cooperative. Normal mood and affect.   Vladimir Crofts, PA-C 10/23/21

## 2021-10-23 ENCOUNTER — Ambulatory Visit: Payer: Medicare HMO | Admitting: Physician Assistant

## 2021-10-23 ENCOUNTER — Ambulatory Visit (HOSPITAL_COMMUNITY): Payer: Medicare HMO | Attending: Cardiovascular Disease

## 2021-10-23 ENCOUNTER — Encounter: Payer: Self-pay | Admitting: Physician Assistant

## 2021-10-23 ENCOUNTER — Other Ambulatory Visit (INDEPENDENT_AMBULATORY_CARE_PROVIDER_SITE_OTHER): Payer: Medicare HMO

## 2021-10-23 ENCOUNTER — Other Ambulatory Visit: Payer: Self-pay

## 2021-10-23 VITALS — BP 114/60 | HR 68 | Ht 67.0 in | Wt 187.2 lb

## 2021-10-23 DIAGNOSIS — K921 Melena: Secondary | ICD-10-CM

## 2021-10-23 DIAGNOSIS — I480 Paroxysmal atrial fibrillation: Secondary | ICD-10-CM | POA: Diagnosis not present

## 2021-10-23 DIAGNOSIS — Z86010 Personal history of colonic polyps: Secondary | ICD-10-CM

## 2021-10-23 DIAGNOSIS — D500 Iron deficiency anemia secondary to blood loss (chronic): Secondary | ICD-10-CM | POA: Diagnosis not present

## 2021-10-23 DIAGNOSIS — K219 Gastro-esophageal reflux disease without esophagitis: Secondary | ICD-10-CM

## 2021-10-23 DIAGNOSIS — C50412 Malignant neoplasm of upper-outer quadrant of left female breast: Secondary | ICD-10-CM | POA: Diagnosis not present

## 2021-10-23 DIAGNOSIS — Z170 Estrogen receptor positive status [ER+]: Secondary | ICD-10-CM

## 2021-10-23 LAB — CBC WITH DIFFERENTIAL/PLATELET
Basophils Absolute: 0 10*3/uL (ref 0.0–0.1)
Basophils Relative: 0.7 % (ref 0.0–3.0)
Eosinophils Absolute: 0.2 10*3/uL (ref 0.0–0.7)
Eosinophils Relative: 3.7 % (ref 0.0–5.0)
HCT: 27.5 % — ABNORMAL LOW (ref 36.0–46.0)
Hemoglobin: 8.6 g/dL — ABNORMAL LOW (ref 12.0–15.0)
Lymphocytes Relative: 25.5 % (ref 12.0–46.0)
Lymphs Abs: 1.4 10*3/uL (ref 0.7–4.0)
MCHC: 31.3 g/dL (ref 30.0–36.0)
MCV: 75.1 fl — ABNORMAL LOW (ref 78.0–100.0)
Monocytes Absolute: 0.5 10*3/uL (ref 0.1–1.0)
Monocytes Relative: 9.2 % (ref 3.0–12.0)
Neutro Abs: 3.3 10*3/uL (ref 1.4–7.7)
Neutrophils Relative %: 60.9 % (ref 43.0–77.0)
Platelets: 242 10*3/uL (ref 150.0–400.0)
RBC: 3.67 Mil/uL — ABNORMAL LOW (ref 3.87–5.11)
RDW: 26.4 % — ABNORMAL HIGH (ref 11.5–15.5)
WBC: 5.5 10*3/uL (ref 4.0–10.5)

## 2021-10-23 LAB — ECHOCARDIOGRAM COMPLETE
Area-P 1/2: 4.06 cm2
S' Lateral: 3.3 cm

## 2021-10-23 LAB — IBC + FERRITIN
Ferritin: 15.6 ng/mL (ref 10.0–291.0)
Iron: 25 ug/dL — ABNORMAL LOW (ref 42–145)
Saturation Ratios: 5.1 % — ABNORMAL LOW (ref 20.0–50.0)
TIBC: 485.8 ug/dL — ABNORMAL HIGH (ref 250.0–450.0)
Transferrin: 347 mg/dL (ref 212.0–360.0)

## 2021-10-23 NOTE — Patient Instructions (Addendum)
_______________________________________________________  If you are age 65 or older, your body mass index should be between 23-30. Your Body mass index is 29.33 kg/m. If this is out of the aforementioned range listed, please consider follow up with your Primary Care Provider.  If you are age 67 or younger, your body mass index should be between 19-25. Your Body mass index is 29.33 kg/m. If this is out of the aformentioned range listed, please consider follow up with your Primary Care Provider.   ________________________________________________________  The [facility] providers would like to encourage you to use [facility] to communicate with providers for non-urgent requests or questions.  Due to long hold times on the telephone, sending your provider a message by [facility] may be a faster and more efficient way to get a response.  Please allow 48 business hours for a response.  Please remember that this is for non-urgent requests.  _______________________________________________________  Your provider has requested that you go to the basement level for lab work before leaving today. Press "B" on the elevator. The lab is located at the first door on the left as you exit the elevator.   Go to the ER if you have any severe weakness, severe AB pain, vomit blood, dark red blood in your bowel movement, shortenss of breath or chest pain.   Increase iron to twice a day with orange juice.   No aleve, ibuprofen, goody powders, as these are antiinflammatories and can cause inflammation in your stomach, increase bleeding risk and cause ulcers. Stop mobic for now  You can talk with PCP about alternative pain options.  Can do tyelnol max 3000 mg a day, salon pas patches are over the counter and voltern gel is topical antiinflammatory that is safe.   Continue ppi twice a day  Just tonight don't take the xarelto.    CAPSULE ENDOSCOPY PATIENT INSTRUCTION Kellie Reynolds  Kellie Reynolds 11-20-1955 [id number]   11-02-2021 Seven (7) days prior to capsule endoscopy stop taking iron supplements and carafate.  11-07-2021 Two (2) days prior to capsule endoscopy stop taking aspirin or any arthritis drugs.  11-08-2021 Day before capsule endoscopy purchase a 238 gram bottle of Miralax from the laxative section of your drug store, and a 32 oz. bottle of Gatorade (no red).    11-08-2021 One (1) day prior to capsule endoscopy: Stop smoking. Eat a regular diet until 12:00 Noon. After 12:00 Noon take only the following: Black coffee  Jell-O (no fruit or red Jell-o) Water   Bouillon (chicken or beef) 7-Up   Cranberry Juice Tea   Kool-Aid Popsicle (not red) Sprite   Coke Ginger Ale  Pepsi Mountain Dew Gatorade At 6:00 pm the evening before your appointment, drink 7 capfuls (105 grams) of Miralax with 32 oz. Gatorade. Drink 8 oz every 15 minutes until gone. Nothing to eat or drink after midnight except medications with a sip of water.  11-09-2021 Day of capsule endoscopy:  No medications for 2 hours prior to your test.  Please arrive at [facility]  3rd floor patient registration area by 8am am on: 11-09-2021.   For any questions: Call [facility] at [phone] and ask to speak with one of the capsule endoscopy nurses.  YOU WILL NEED TO RETURN THE EQUIPMENT AT 4 PM ON THE DAY OF THE PROCEDURE.  PLEASE KEEP THIS IN MIND WHEN SCHEDULING.   The above instructions have been reviewed and explained to me by________________   Patient signature:_________________________________________     Date:________________  Diamantina Monks  Bowel Capsule Endoscopy  What you should know: Small Bowel capsule endoscopy is a procedure that takes pictures of the inside of your small intestine (bowel).  Your small bowel connects to your stomach on one end, and your large bowel (colon) on the other.  A capsule endoscopy is done by swallowing a pill size camera.  The capsule moves through your  stomach and into your small bowel, where pictures are taken.   You may need a small bowel capsule endoscopy if you have symptoms, such as blood in your stool, chronic stomach pain, and diarrhea.  The pictures may show if you have growths, swelling, and bleeding area in you small bowel.  A capsule endoscopy may also show if diseases such as Crohn's or celiac disease are causing your symptoms.  Having a small bowel capsule endoscopy may help you and your caregiver learn the cause of your symptoms.  Learning what is causing your symptoms allows you to receive needed treatment and prevent further problems. Risks: You may have stomach pain during your procedure.   The pictures taken by the capsule may not be clear.   The pictures may not show the cause of your symptoms.   You may need another endoscopy procedure.   The capsule may get trapped in your esophagus or intestines. You may need surgery or additional procedures to remove the capsule from your body.    Before your procedure: You will be instructed to stop certain prescription medications or over- the -counter medications prior to the procedure.   The day before your scheduled appointment you will need to be on a restricted diet and will need to drink a bowel prep that will clean out your bowels.   The day of the procedure: You may drive yourself to the procedure.   You will need to plan on 2 trips to the office on the day of the procedure. Morning: Plan to be at the office about 45 minutes. The morning of the procedure a sensor belt and recorder will be placed on you.  You will wear this for 8 hours.  (The sensor belt transfers pictures of your small bowel to the recorder.)   You will be given a pill-sized capsule endoscope to swallow.  Once you swallow the capsule it will travel through your body the same way food does, constantly taking pictures along the way.  The capsule takes 2-3 pictures a second.   Once you have left the office you may  go about your normal day with a few exceptions: You may not go near a MRI machine or a radio or television towers; You need to avoid other patients having capsule endoscopy; You will be given a written diet to follow for the day.  Afternoon: You will need to be return to the office at your designated time. The sensors belt will be removed You will need to be at the office about 15 minutes.   It was a pleasure to see you today!  Thank you for trusting me with your gastrointestinal care!

## 2021-10-25 NOTE — Progress Notes (Signed)
Agree with the assessment and plan as outlined by Vicie Mutters,  PA-C.  If patient still having melenic stools, would recommend expedited repeat EGD.  If stools have normalized and hemoglobin increasing, this may not be necessary given her EGD earlier this year.    Melinda Pottinger E. Candis Schatz, MD

## 2021-10-26 ENCOUNTER — Telehealth: Payer: Self-pay | Admitting: Physician Assistant

## 2021-10-26 NOTE — Telephone Encounter (Signed)
Scheduled appt per 8/11 referral. Pt is aware of appt date and time. Pt is aware to arrive 15 mins prior to appt time and to bring and updated insurance card. Pt is aware of appt location.   

## 2021-11-02 NOTE — Progress Notes (Unsigned)
Name: Kellie Reynolds  MRN/ DOB: [id number], September 05, 1955    Age/ Sex: 66 y.o., female    PCP: Barnetta Chapel, NP   Reason for Endocrinology Evaluation: MNG     Date of Initial Endocrinology Evaluation: 06/18/2021    HPI: [name] is a 66 y.o. female with a past medical history of MNG, A.Fib, osteoporosis and DM, Hx of breast Ca ( Left lumpectomy . The patient presented for initial endocrinology clinic visit on 06/18/2021 for consultative assistance with her Subclinical Hyperthyroidism and MNG.   Patient states that she has had diarrhea since 2020 which was started following radiation treatment for left breast cancer.  During evaluation for causes of diarrhea the patient has been noted to have multinodular goiter   I have reviewed her GI follow-up visit 04/17/2021, and it seems like the visit was mainly focused on GERD symptoms, there is no mention of diarrhea but there has been a mention of loose stools and additional fiber was recommended   No FH of thyroid disease    In review of her chart she had a Hx of thyroid uptake and scan in 2008 with a 24-hr uptake at 12% . Thyroid uptake and scan 03/2021 showed subtle areas of decrease tracer uptake at the lateral aspect of mid and inferior right love.    On her initial visit to our clinic she had an TSH 0.31 uIU/mL with normal T4 and T3.  We started methimazole 2.5 mg daily   SUBJECTIVE:    Today (.toda):  [name]  She has occasional local neck swelling  Has noted weight gain rather than weight loss Denies tremors  Has occasional palpitations, A. Fib stable ( S/P cardioversion ) Denies prior use of Amiodarone  No worsening anxiety   She presented to the ED 09/2021 with abdominal pain, LFT's normal    No biotin intake   HISTORY:  Past Medical History:  Past Medical History:  Diagnosis Date   Arthritis    right knee   Family history of adverse reaction to anesthesia 31 yrs ago   daughter had malignant  hyperthermia reaction with acentine, daughter has had surgeries since then and did welll   Fibrocystic breast disease    GERD (gastroesophageal reflux disease)    HTN (hypertension)    Hypercholesteremia    IDA (iron deficiency anemia)    MVP (mitral valve prolapse)    Paroxysmal atrial fibrillation (HCC)    a. s/p PVI   Skin cancer    Sleep apnea    uses cpap 4 nights per week, pt does not knoe settings   Past Surgical History:  Past Surgical History:  Procedure Laterality Date   ATRIAL FIBRILLATION ABLATION N/A 04/18/2012   PVI by Allred   COLONOSCOPY WITH PROPOFOL N/A 11/24/2015   Procedure: COLONOSCOPY WITH PROPOFOL;  Surgeon: Garlan Fair, MD;  Location: [facility];  Service: Endoscopy;  Laterality: N/A;   EYE SURGERY Bilateral    lasix eye surgery both eyes   MENISECTOMY     Right knee   NASAL POLYP SURGERY  1990   NASAL SINUS SURGERY  1990   TEE WITHOUT CARDIOVERSION  04/17/2012   Procedure: TRANSESOPHAGEAL ECHOCARDIOGRAM (TEE);  Surgeon: Jettie Booze, MD;  Location: [facility];  Service: Cardiovascular;  Laterality: N/A;  pre ablation   TUBAL LIGATION      Social History:  reports that she has been smoking cigarettes. She has been smoking an average of 1 pack  per day. She has never used smokeless tobacco. She reports current alcohol use. She reports that she does not use drugs. Family History: family history includes CAD in her father; Diabetes in her mother; Heart attack in her father; Hyperlipidemia in her father; Hypertension in her father; Liver cancer in her maternal grandmother.   HOME MEDICATIONS: Allergies as of 11/03/2021       Reactions   Anastrozole Other (See Comments)   unknown   Latex Itching, Rash, Other (See Comments)   Elastic in underwear        Medication List        Accurate as of November 02, 2021  2:48 PM. If you have any questions, ask your nurse or doctor.          acetaminophen 500 MG tablet Commonly known as:  TYLENOL Take 1,500 mg by mouth 3 (three) times daily as needed for headache.   alendronate 70 MG tablet Commonly known as: FOSAMAX Take 70 mg by mouth once a week.   atorvastatin 20 MG tablet Commonly known as: LIPITOR Take 20 mg by mouth daily.   citalopram 10 MG tablet Commonly known as: CELEXA Take 10 mg by mouth daily.   ferrous sulfate 325 (65 FE) MG tablet Take 325 mg by mouth daily.   flecainide 100 MG tablet Commonly known as: TAMBOCOR Take 0.5 tablets (50 mg total) by mouth 2 (two) times daily.   guaiFENesin 600 MG 12 hr tablet Commonly known as: MUCINEX Take 600 mg by mouth daily as needed (allergies).   letrozole 2.5 MG tablet Commonly known as: FEMARA Take 2.5 mg by mouth daily.   losartan 50 MG tablet Commonly known as: COZAAR Take 50 mg by mouth in the morning and at bedtime.   meloxicam 15 MG tablet Commonly known as: MOBIC Take 15 mg by mouth daily.   methimazole 5 MG tablet Commonly known as: TAPAZOLE TAKE 1/2 TABLET BY MOUTH DAILY   metoprolol tartrate 50 MG tablet Commonly known as: LOPRESSOR TAKE 0.5 TABLETS BY MOUTH 2 TIMES DAILY. What changed: See the new instructions.   omeprazole 40 MG capsule Commonly known as: PRILOSEC TAKE 1 CAPSULE BY MOUTH 2 (TWO) TIMES DAILY 20-30 MINUTES BEFORE MEALS. What changed: See the new instructions.   sodium chloride 0.65 % nasal spray Commonly known as: OCEAN Place 1 spray into the nose 2 (two) times daily as needed. For dryness   Vitamin D3 1.25 MG (50000 UT) Caps Take 1 capsule by mouth once a week.   Xarelto 20 MG Tabs tablet Generic drug: rivaroxaban TAKE 1 TABLET BY MOUTH DAILY WITH SUPPER What changed: how much to take          REVIEW OF SYSTEMS: A comprehensive ROS was conducted with the patient and is negative except as per HPI and below:  ROS     OBJECTIVE:  VS: There were no vitals taken for this visit.   Wt Readings from Last 3 Encounters:  10/23/21 187 lb 4 oz (84.9 kg)   10/03/21 191 lb (86.6 kg)  06/18/21 185 lb (83.9 kg)     EXAM: General: Pt appears well and is in NAD  Hydration: Well-hydrated with moist mucous membranes and good skin turgor  Eyes: External eye exam normal without stare, lid lag or exophthalmos.  EOM intact.  PERRL.  Ears, Nose, Throat: Hearing: Grossly intact bilaterally Dental: Good dentition  Throat: Clear without mass, erythema or exudate  Neck: General: Supple without adenopathy. Thyroid: Thyroid size normal.  No goiter or nodules appreciated. No thyroid bruit.  Lungs: Clear with good BS bilat with no rales, rhonchi, or wheezes  Heart: Auscultation: RRR.  Abdomen: Normoactive bowel sounds, soft, nontender, without masses or organomegaly palpable  Extremities: Gait and station: Normal gait  Digits and nails: No clubbing, cyanosis, petechiae, or nodes Head and neck: Normal alignment and mobility BL UE: Normal ROM and strength. BL LE: No pretibial edema normal ROM and strength.  Skin: Hair: Texture and amount normal with gender appropriate distribution Skin Inspection: No rashes, acanthosis nigricans/skin tags. No lipohypertrophy Skin Palpation: Skin temperature, texture, and thickness normal to palpation  Neuro: Cranial nerves: II - XII grossly intact  Cerebellar: Normal coordination and movement; no tremor Motor: Normal strength throughout DTRs: 2+ and symmetric in UE without delay in relaxation phase  Mental Status: Judgment, insight: Intact Orientation: Oriented to time, place, and person Memory: Intact for recent and remote events Mood and affect: No depression, anxiety, or agitation     DATA REVIEWED:     Latest Reference Range & Units 06/18/21 12:22  TSH 0.35 - 5.50 uIU/mL 0.31 (L)  Triiodothyronine (T3) 76 - 181 ng/dL 97  T4,Free(Direct) 0.60 - 1.60 ng/dL 0.76     Thyroid ultrasound 03/25/2021 Estimated total number of nodules >/= 1 cm: 3   Number of spongiform nodules >/=  2 cm not described below  (TR1): 0   Number of mixed cystic and solid nodules >/= 1.5 cm not described below (TR2): 0   _________________________________________________________   Nodule # 1:   Location: Right; Mid   Maximum size: 1.1 cm; Other 2 dimensions: 0.8 x 1.1 cm   Composition: solid/almost completely solid (2)   Echogenicity: hypoechoic (2)   Shape: not taller-than-wide (0)   Margins: smooth (0)   Echogenic foci: macrocalcifications (1)   ACR TI-RADS total points: 5.   ACR TI-RADS risk category: TR4 (4-6 points).   ACR TI-RADS recommendations:   *Given size (>/= 1 - 1.4 cm) and appearance, a follow-up ultrasound in 1 year should be considered based on TI-RADS criteria.   _________________________________________________________   Nodule # 2:   Location: Right; Mid   Maximum size: 1.9 cm; Other 2 dimensions: 0.9 x 1.4 cm   Composition: mixed cystic and solid (1)   Echogenicity: isoechoic (1)   Shape: not taller-than-wide (0)   Margins: ill-defined (0)   Echogenic foci: none (0)   ACR TI-RADS total points: 2.   ACR TI-RADS risk category: TR2 (2 points).   ACR TI-RADS recommendations:   This nodule does NOT meet TI-RADS criteria for biopsy or dedicated follow-up.   _________________________________________________________   Nodule # 3:   Location: Right; Inferior   Maximum size: 1.2 cm; Other 2 dimensions: 1.1 x 0.7 cm   Composition: spongiform (0)   Echogenicity: isoechoic (1)   Shape: not taller-than-wide (0)   Margins: smooth (0)   Echogenic foci: none (0)   ACR TI-RADS total points: 1.   ACR TI-RADS risk category: TR1 (0-1 points).   ACR TI-RADS recommendations:   This nodule does NOT meet TI-RADS criteria for biopsy or dedicated follow-up.   _________________________________________________________   Tiny punctate cystic nodules in the left thyroid incidentally noted. No hypervascularity. No regional adenopathy.   IMPRESSION: 1.1 cm right  mid thyroid TR 4 nodule meets criteria for follow-up in 1 year. Currently no biopsy indicated.    Thyroid Uptake 03/26/2021    FINDINGS: Homogeneous tracer distribution LEFT thyroid lobe.   Subtle areas of decreased tracer  uptake at lateral aspect of mid and inferior RIGHT thyroid lobe; these correspond to nodules identified on ultrasound.   No additional areas of increased or decreased tracer uptake seen.   4 hour I-123 uptake = 10.1% (normal 5-20%)   24 hour I-123 uptake = 25.5% (normal 10-30%)   IMPRESSION: Normal 4 hour and 24 hour radio iodine uptakes.   Subtle cold nodules at the lateral aspect of the mid and inferior RIGHT thyroid lobe, corresponding to nodules identified by thyroid ultrasound; please see recommendations of recent thyroid ultrasound.     ASSESSMENT/PLAN/RECOMMENDATIONS:   MNG  -No local neck symptoms -Thyroid uptake and scan shows subtle cold nodules at the lateral aspect of the mid and the inferior right thyroid lobe but based on thyroid ultrasound none of these nodules meet criteria for FNA at this time -We will monitor with annual ultrasounds    2. Subclinical Hyperthyroidism :   The causes of subclinical hyperthyroidism are the same as the causes of overt hyperthyroidism, and like overt hyperthyroidism, subclinical hyperthyroidism can be persistent or transient. Common causes of subclinical hyperthyroidism include autonomously functioning thyroid adenomas and multinodular goiters, or Graves' disease    Most patients with subclinical hyperthyroidism have no clinical manifestations of hyperthyroidism, and those symptoms that are present (eg, tachycardia, tremor, dyspnea on exertion, weight loss) are mild and nonspecific." However, subclinical hyperthyroidism is associated with an increased risk of atrial fibrillation and, primarily in postmenopausal women, a decrease in bone mineral density.  -TSH remains to be low, I would recommend treatment  given personal history of atrial fibrillation and the importance of keeping your thyroid levels to prevent any cardiac arrhythmias    Medication Methimazole 5 mg, half a tablet daily      Signed electronically by: Mack Guise, MD  [facility]  [facility] [address] [place], [place] [address] Phone: [phone] FAX: [phone]   CC: Barnetta Chapel, NP [place] [address] Phone: [phone] Fax: [phone]   Return to Endocrinology clinic as below: Future Appointments  Date Time Provider [place]  11/03/2021 11:10 AM Juleen Sorrels, Melanie Crazier, MD [facility] None  11/04/2021  8:30 AM [facility] 2 [facility] [place]  11/09/2021  8:30 AM [facility] DIAGNOSTIC TESTING [facility]  11/18/2021 11:00 AM Dede Query T, PA-C [facility] None  11/18/2021 12:00 PM [facility] LAB [facility] None

## 2021-11-03 ENCOUNTER — Encounter: Payer: Self-pay | Admitting: Internal Medicine

## 2021-11-03 ENCOUNTER — Ambulatory Visit (INDEPENDENT_AMBULATORY_CARE_PROVIDER_SITE_OTHER): Payer: Medicare HMO | Admitting: Internal Medicine

## 2021-11-03 VITALS — BP 110/70 | HR 62 | Ht 67.0 in | Wt 184.0 lb

## 2021-11-03 DIAGNOSIS — E042 Nontoxic multinodular goiter: Secondary | ICD-10-CM | POA: Diagnosis not present

## 2021-11-03 DIAGNOSIS — E038 Other specified hypothyroidism: Secondary | ICD-10-CM | POA: Diagnosis not present

## 2021-11-03 LAB — T4, FREE: Free T4: 0.67 ng/dL (ref 0.60–1.60)

## 2021-11-03 LAB — TSH: TSH: 0.43 u[IU]/mL (ref 0.35–5.50)

## 2021-11-03 MED ORDER — METHIMAZOLE 5 MG PO TABS: 2.5000 mg | ORAL_TABLET | Freq: Every day | ORAL | 3 refills | Status: DC

## 2021-11-04 ENCOUNTER — Encounter (HOSPITAL_COMMUNITY): Payer: Self-pay

## 2021-11-04 ENCOUNTER — Ambulatory Visit (HOSPITAL_COMMUNITY)
Admission: RE | Admit: 2021-11-04 | Discharge: 2021-11-04 | Disposition: A | Discharge status: Home / Self Care | Payer: Medicare HMO | Source: Ambulatory Visit | Attending: Family Medicine | Admitting: Family Medicine

## 2021-11-04 DIAGNOSIS — R339 Retention of urine, unspecified: Secondary | ICD-10-CM | POA: Diagnosis present

## 2021-11-04 DIAGNOSIS — R0602 Shortness of breath: Secondary | ICD-10-CM | POA: Diagnosis present

## 2021-11-04 DIAGNOSIS — R140 Abdominal distension (gaseous): Secondary | ICD-10-CM | POA: Diagnosis present

## 2021-11-04 LAB — T3: T3, Total: 93 ng/dL (ref 76–181)

## 2021-11-09 ENCOUNTER — Ambulatory Visit (INDEPENDENT_AMBULATORY_CARE_PROVIDER_SITE_OTHER): Payer: Medicare HMO | Admitting: Gastroenterology

## 2021-11-09 DIAGNOSIS — D500 Iron deficiency anemia secondary to blood loss (chronic): Secondary | ICD-10-CM | POA: Diagnosis not present

## 2021-11-09 NOTE — Progress Notes (Signed)
Capsule ID: [id number] Exp: 02/19/2023 LOT: [id number]  Patient arrived for VCE. Reported the prep went well. This RN explained capsule dietary restrictions for the next few hours. Pt advised to return at 4 pm to return capsule equipment.  Patient verbalized understanding. Opened capsule, ensured capsule was flashing prior to the patient swallowing the capsule. Patient swallowed capsule without difficulty.  Patient told to call the office with any questions and if capsule has not passed after 72 hours. No further questions by the conclusion of the visit.

## 2021-11-09 NOTE — Patient Instructions (Signed)
Contact our office immediately at [phone] if you suffer from any abdominal pain, nausea, or vomiting during capsule endoscopy. °a) Do not eat or drink for at least 2 hours. °After 2 hours you may have any of the following to drink: °Water   White grape juice °7-Up   Chicken Bouillon °Sprite   Ginger Ale °c) After 4 hours you may have a light snack to include any of the following: °A cup of soup  ½ sandwich °Bowl of cereal  Rice °Toast   Eggs °2-3 small cookies (i.e. vanilla wafers or graham crackers) °d) After 8 hours you may return to your regular diet. °During your procedure do not go near anyone else that is having capsule endoscopy. °Do not be in close contact with an MRI machine or a radio or television tower. °Do not wear a heavy coat or sweater because your recorder may over heat and stop recording.   °Do not disconnect the equipment or remove the belt at any time.  Since the Data Recorder is actually a small computer, it should be treated with utmost care and protection.  Avoid sudden movement and banging of the Data Recorder.  Do not do any heavy lifting or strenuous physical activity during the test especially if it involves sweating and do not bend over or stoop during capsule endoscopy. °During capsule endoscopy, you will need to verify every 15 minutes that the small light on top of the Data Recorder is blinking twice per second.  If for some reason it stops blinking at this site, record the time and contact our office at [phone]. ° ° ° °

## 2021-11-12 ENCOUNTER — Telehealth: Payer: Self-pay

## 2021-11-12 DIAGNOSIS — T184XXA Foreign body in colon, initial encounter: Secondary | ICD-10-CM

## 2021-11-12 NOTE — Telephone Encounter (Signed)
Left message for pt to call back.  Pt does not think she has passed the capsule. Pt to come in for KUB. Order in epic and pt aware.

## 2021-11-12 NOTE — Telephone Encounter (Signed)
-----   Message from Alfredia Ferguson, [place] sent at 11/10/2021  2:48 PM EDT ----- Regarding: Capsule Scott-  Capsule ready on this pt - reports are in your folder in my office   It was Incomplete - retained in stomach for entire study  Study was just done yesterday    You also have another capsule that needs signed off on - in your folder ( you sign  3 copies, keep one , leave the others on Amy's desk )    Vaughan Basta- please call pt Thursday or Friday and if has not passed the capsule will need KUB

## 2021-11-17 ENCOUNTER — Ambulatory Visit (INDEPENDENT_AMBULATORY_CARE_PROVIDER_SITE_OTHER)
Admission: RE | Admit: 2021-11-17 | Discharge: 2021-11-17 | Disposition: A | Discharge status: Home / Self Care | Payer: Medicare HMO | Source: Ambulatory Visit | Attending: Physician Assistant | Admitting: Physician Assistant

## 2021-11-17 DIAGNOSIS — T184XXA Foreign body in colon, initial encounter: Secondary | ICD-10-CM | POA: Diagnosis not present

## 2021-11-18 ENCOUNTER — Inpatient Hospital Stay: Payer: Medicare HMO | Attending: Physician Assistant | Admitting: Physician Assistant

## 2021-11-18 ENCOUNTER — Telehealth: Payer: Self-pay

## 2021-11-18 ENCOUNTER — Encounter: Payer: Self-pay | Admitting: Physician Assistant

## 2021-11-18 ENCOUNTER — Inpatient Hospital Stay: Payer: Medicare HMO

## 2021-11-18 ENCOUNTER — Other Ambulatory Visit: Payer: Self-pay

## 2021-11-18 VITALS — BP 140/57 | HR 65 | Temp 97.8°F | Resp 17 | Ht 67.0 in | Wt 190.8 lb

## 2021-11-18 DIAGNOSIS — I10 Essential (primary) hypertension: Secondary | ICD-10-CM | POA: Insufficient documentation

## 2021-11-18 DIAGNOSIS — Z7901 Long term (current) use of anticoagulants: Secondary | ICD-10-CM | POA: Diagnosis not present

## 2021-11-18 DIAGNOSIS — F17210 Nicotine dependence, cigarettes, uncomplicated: Secondary | ICD-10-CM | POA: Insufficient documentation

## 2021-11-18 DIAGNOSIS — C50912 Malignant neoplasm of unspecified site of left female breast: Secondary | ICD-10-CM | POA: Insufficient documentation

## 2021-11-18 DIAGNOSIS — K922 Gastrointestinal hemorrhage, unspecified: Secondary | ICD-10-CM | POA: Insufficient documentation

## 2021-11-18 DIAGNOSIS — K219 Gastro-esophageal reflux disease without esophagitis: Secondary | ICD-10-CM | POA: Diagnosis not present

## 2021-11-18 DIAGNOSIS — R0609 Other forms of dyspnea: Secondary | ICD-10-CM

## 2021-11-18 DIAGNOSIS — Z79899 Other long term (current) drug therapy: Secondary | ICD-10-CM | POA: Insufficient documentation

## 2021-11-18 DIAGNOSIS — Z806 Family history of leukemia: Secondary | ICD-10-CM | POA: Diagnosis not present

## 2021-11-18 DIAGNOSIS — D500 Iron deficiency anemia secondary to blood loss (chronic): Secondary | ICD-10-CM

## 2021-11-18 DIAGNOSIS — I4891 Unspecified atrial fibrillation: Secondary | ICD-10-CM | POA: Diagnosis not present

## 2021-11-18 DIAGNOSIS — Z853 Personal history of malignant neoplasm of breast: Secondary | ICD-10-CM

## 2021-11-18 DIAGNOSIS — R0602 Shortness of breath: Secondary | ICD-10-CM | POA: Insufficient documentation

## 2021-11-18 DIAGNOSIS — D509 Iron deficiency anemia, unspecified: Secondary | ICD-10-CM | POA: Diagnosis present

## 2021-11-18 DIAGNOSIS — Z923 Personal history of irradiation: Secondary | ICD-10-CM | POA: Insufficient documentation

## 2021-11-18 DIAGNOSIS — Z808 Family history of malignant neoplasm of other organs or systems: Secondary | ICD-10-CM | POA: Diagnosis not present

## 2021-11-18 LAB — CBC WITH DIFFERENTIAL (CANCER CENTER ONLY)
Abs Immature Granulocytes: 0.02 10*3/uL (ref 0.00–0.07)
Basophils Absolute: 0 10*3/uL (ref 0.0–0.1)
Basophils Relative: 1 %
Eosinophils Absolute: 0.2 10*3/uL (ref 0.0–0.5)
Eosinophils Relative: 3 %
HCT: 36.7 % (ref 36.0–46.0)
Hemoglobin: 11.6 g/dL — ABNORMAL LOW (ref 12.0–15.0)
Immature Granulocytes: 0 %
Lymphocytes Relative: 20 %
Lymphs Abs: 1.3 10*3/uL (ref 0.7–4.0)
MCH: 26.3 pg (ref 26.0–34.0)
MCHC: 31.6 g/dL (ref 30.0–36.0)
MCV: 83.2 fL (ref 80.0–100.0)
Monocytes Absolute: 0.5 10*3/uL (ref 0.1–1.0)
Monocytes Relative: 8 %
Neutro Abs: 4.3 10*3/uL (ref 1.7–7.7)
Neutrophils Relative %: 68 %
Platelet Count: 200 10*3/uL (ref 150–400)
RBC: 4.41 MIL/uL (ref 3.87–5.11)
RDW: 23.7 % — ABNORMAL HIGH (ref 11.5–15.5)
WBC Count: 6.3 10*3/uL (ref 4.0–10.5)
nRBC: 0 % (ref 0.0–0.2)

## 2021-11-18 LAB — RETIC PANEL
Immature Retic Fract: 11.6 % (ref 2.3–15.9)
RBC.: 4.4 MIL/uL (ref 3.87–5.11)
Retic Count, Absolute: 65.1 10*3/uL (ref 19.0–186.0)
Retic Ct Pct: 1.5 % (ref 0.4–3.1)
Reticulocyte Hemoglobin: 32.8 pg (ref >27.9)

## 2021-11-18 LAB — IRON AND IRON BINDING CAPACITY (CC-WL,HP ONLY)
Iron: 263 ug/dL — ABNORMAL HIGH (ref 28–170)
Saturation Ratios: 75 % — ABNORMAL HIGH (ref 10.4–31.8)
TIBC: 351 ug/dL (ref 250–450)
UIBC: 88 ug/dL — ABNORMAL LOW (ref 148–442)

## 2021-11-18 LAB — CMP (CANCER CENTER ONLY)
ALT: 12 U/L (ref 0–44)
AST: 18 U/L (ref 15–41)
Albumin: 4.1 g/dL (ref 3.5–5.0)
Alkaline Phosphatase: 62 U/L (ref 38–126)
Anion gap: 5 (ref 5–15)
BUN: 5 mg/dL — ABNORMAL LOW (ref 8–23)
CO2: 30 mmol/L (ref 22–32)
Calcium: 9.5 mg/dL (ref 8.9–10.3)
Chloride: 105 mmol/L (ref 98–111)
Creatinine: 0.63 mg/dL (ref 0.44–1.00)
GFR, Estimated: >60 mL/min (ref >60)
Glucose, Bld: 112 mg/dL — ABNORMAL HIGH (ref 70–99)
Potassium: 4 mmol/L (ref 3.5–5.1)
Sodium: 140 mmol/L (ref 135–145)
Total Bilirubin: 0.3 mg/dL (ref 0.3–1.2)
Total Protein: 6.9 g/dL (ref 6.5–8.1)

## 2021-11-18 LAB — FERRITIN: Ferritin: 23 ng/mL (ref 11–307)

## 2021-11-18 NOTE — Telephone Encounter (Signed)
-----   Message from Thompson Grayer, MD sent at 10/30/2021  4:42 PM EDT ----- Result reviewed.  Please make sure patient has access to result through mychart or receives notification.

## 2021-11-19 NOTE — Progress Notes (Signed)
[place] [phone]   [phone] [facility] NOTE  Patient Care Team: Barnetta Chapel, NP as PCP - General (Family Medicine) Thompson Grayer, MD as PCP - Cardiology (Cardiology)  Hematological/Oncological History -10/03/2021: Hgb 7.8. MCV 75.4 -10/23/2021: Hgb 8.6, MCV 75.1, Iron 25, Ferritin 15.6, Saturation 5.1%.   -11/18/2021: Establish care with [facility] with Dr. Narda Rutherford and Dede Query PA-C  CHIEF COMPLAINTS/PURPOSE OF CONSULTATION:  Iron deficiency anemia  HISTORY OF PRESENTING ILLNESS:  Kellie Reynolds 66 y.o. female with medical history significant for breast cancer, GERD, HTN, hyperlipidemia, mitral valve prolapse and atrial fibrillation. She presents to the clinic for evaluation for iron deficiency anemia. She is unaccompanied for this visit.   On exam today, Kellie Reynolds reports slight improvement of her energy levels since starting iron pills. She is able to complete her ADLs on her own. She denies any weight loss and reports following a vegetarian diet. She reports dry mouth although she is staying hydrated. She reports occasional episodes of nausea without any vomiting episodes. Patient reports having diarrhea, up to 4-5 episodes per day. She takes imodium as needed only. She has reports melena for several months well before taking iron pills. She has noticed blood on toilet paper when she wipes. She has chronic shortness of breath with exertion due to being a chronic smoker. She denies fevers, chills, night sweats, chest pain or cough. She has no other complaints. Rest of the 10 point ROS is below.   MEDICAL HISTORY:  Past Medical History:  Diagnosis Date   Arthritis    right knee   Family history of adverse reaction to anesthesia 51 yrs ago   daughter had malignant hyperthermia reaction with acentine, daughter has had surgeries since then and did welll   Fibrocystic breast disease    GERD (gastroesophageal reflux  disease)    HTN (hypertension)    Hypercholesteremia    IDA (iron deficiency anemia)    MVP (mitral valve prolapse)    Paroxysmal atrial fibrillation (HCC)    a. s/p PVI   Skin cancer    Sleep apnea    no longer use CPAP    SURGICAL HISTORY: Past Surgical History:  Procedure Laterality Date   ATRIAL FIBRILLATION ABLATION N/A 04/18/2012   PVI by Allred   BREAST LUMPECTOMY  2022   COLONOSCOPY WITH PROPOFOL N/A 11/24/2015   Procedure: COLONOSCOPY WITH PROPOFOL;  Surgeon: Garlan Fair, MD;  Location: [facility] END[facility]COPY;  Service: Endoscopy;  Laterality: N/A;   EYE SURGERY Bilateral    lasix eye surgery both eyes   MENISECTOMY     Right knee   NASAL POLYP SURGERY  1990   NASAL SINUS SURGERY  1990   TEE WITHOUT CARDIOVERSION  04/17/2012   Procedure: TRANSESOPHAGEAL ECHOCARDIOGRAM (TEE);  Surgeon: Jettie Booze, MD;  Location: [facility] ENDOSCOPY;  Service: Cardiovascular;  Laterality: N/A;  pre ablation   TUBAL LIGATION      SOCIAL HISTORY: Social History   Socioeconomic History   Marital status: Divorced    Spouse name: Not on file   Number of children: 1   Years of education: Not on file   Highest education level: Not on file  Occupational History   Not on file  Tobacco Use   Smoking status: Every Day    Packs/day: 1.00    Years: 50.00    Total pack years: 50.00    Types: Cigarettes   Smokeless tobacco: Never  Vaping Use  Vaping Use: Never used  Substance and Sexual Activity   Alcohol use: Yes    Comment: 4 beers oper day, regular sized beers   Drug use: No   Sexual activity: Not on file  Other Topics Concern   Not on file  Social History Narrative   Not on file   Social Determinants of Health   Financial Resource Strain: Not on file  Food Insecurity: Not on file  Transportation Needs: Not on file  Physical Activity: Not on file  Stress: Not on file  Social Connections: Not on file  Intimate Partner Violence: Not on file    FAMILY HISTORY: Family  History  Problem Relation Age of Onset   Diabetes Mother    Hyperlipidemia Father    Hypertension Father    CAD Father    Heart attack Father    Liver cancer Maternal Grandmother    Leukemia Maternal Grandfather    Colon cancer Neg Hx    Esophageal cancer Neg Hx    Stomach cancer Neg Hx    Rectal cancer Neg Hx     ALLERGIES:  is allergic to anastrozole and latex.  MEDICATIONS:  Current Outpatient Medications  Medication Sig Dispense Refill   acetaminophen (TYLENOL) 500 MG tablet Take 1,500 mg by mouth 3 (three) times daily as needed for headache.     alendronate (FOSAMAX) 70 MG tablet Take 70 mg by mouth once a week.     atorvastatin (LIPITOR) 20 MG tablet Take 20 mg by mouth daily.     Cholecalciferol (VITAMIN D3) 1.25 MG (50000 UT) CAPS Take 1 capsule by mouth once a week.     citalopram (CELEXA) 10 MG tablet Take 10 mg by mouth daily.     ferrous sulfate 325 (65 FE) MG tablet Take 325 mg by mouth daily.     flecainide (TAMBOCOR) 100 MG tablet Take 0.5 tablets (50 mg total) by mouth 2 (two) times daily. 90 tablet 3   guaiFENesin (MUCINEX) 600 MG 12 hr tablet Take 600 mg by mouth daily as needed (allergies).     letrozole (FEMARA) 2.5 MG tablet Take 2.5 mg by mouth daily.     losartan (COZAAR) 50 MG tablet Take 50 mg by mouth in the morning and at bedtime.     methimazole (TAPAZOLE) 5 MG tablet Take 0.5 tablets (2.5 mg total) by mouth daily. 45 tablet 3   metoprolol tartrate (LOPRESSOR) 50 MG tablet TAKE 0.5 TABLETS BY MOUTH 2 TIMES DAILY. (Patient taking differently: Take 25 mg by mouth 2 (two) times daily.) 90 tablet 3   omeprazole (PRILOSEC) 40 MG capsule TAKE 1 CAPSULE BY MOUTH 2 (TWO) TIMES DAILY 20-30 MINUTES BEFORE MEALS. (Patient taking differently: Take 40 mg by mouth in the morning and at bedtime.) 180 capsule 1   sodium chloride (OCEAN) 0.65 % nasal spray Place 1 spray into the nose 2 (two) times daily as needed. For dryness     XARELTO 20 MG TABS tablet TAKE 1 TABLET BY  MOUTH DAILY WITH SUPPER (Patient taking differently: Take 20 mg by mouth daily with supper.) 30 tablet 5   meloxicam (MOBIC) 15 MG tablet Take 15 mg by mouth daily. (Patient not taking: Reported on 11/18/2021)     No current facility-administered medications for this visit.    REVIEW OF SYSTEMS:   Constitutional: ( - ) fevers, ( - )  chills , ( - ) night sweats Eyes: ( - ) blurriness of vision, ( - ) double vision, ( - )  watery eyes Ears, nose, mouth, throat, and face: ( - ) mucositis, ( - ) sore throat Respiratory: ( - ) cough, ( + ) dyspnea, ( - ) wheezes Cardiovascular: ( - ) palpitation, ( - ) chest discomfort, ( - ) lower extremity swelling Gastrointestinal:  ( + ) nausea, ( - ) heartburn, ( + ) change in bowel habits Skin: ( - ) abnormal skin rashes Lymphatics: ( - ) new lymphadenopathy, ( - ) easy bruising Neurological: ( - ) numbness, ( - ) tingling, ( - ) new weaknesses Behavioral/Psych: ( - ) mood change, ( - ) new changes  All other systems were reviewed with the patient and are negative.  PHYSICAL EXAMINATION: ECOG PERFORMANCE STATUS: 1 - Symptomatic but completely ambulatory  Vitals:   11/18/21 1108  BP: (!) 140/57  Pulse: 65  Resp: 17  Temp: 97.8 F (36.6 C)  SpO2: 96%   Filed Weights   11/18/21 1108  Weight: 190 lb 12.8 oz (86.5 kg)    GENERAL: well appearing female in NAD  SKIN: skin color, texture, turgor are normal, no rashes or significant lesions EYES: conjunctiva are pink and non-injected, sclera clear OROPHARYNX: no exudate, no erythema; lips, buccal mucosa, and tongue normal  NECK: supple, non-tender LYMPH:  no palpable lymphadenopathy in the cervical or supraclavicular lymph nodes.  LUNGS: clear to auscultation and percussion with normal breathing effort HEART: regular rate & rhythm and no murmurs and no lower extremity edema ABDOMEN: soft, non-tender, non-distended, normal bowel sounds Musculoskeletal: no cyanosis of digits and no clubbing  PSYCH:  alert & oriented x 3, fluent speech NEURO: no focal motor/sensory deficits  LABORATORY DATA:  I have reviewed the data as listed    Latest Ref Rng & Units 11/18/2021   12:28 PM 10/23/2021    2:31 PM 10/03/2021    9:48 AM  CBC  WBC 4.0 - 10.5 K/uL 6.3  5.5  9.3   Hemoglobin 12.0 - 15.0 g/dL 11.6  8.6 Repeated and verified X2.  7.8   Hematocrit 36.0 - 46.0 % 36.7  27.5  26.4   Platelets 150 - 400 K/uL 200  242.0  260        Latest Ref Rng & Units 11/18/2021   12:28 PM 10/03/2021    9:48 AM 10/07/2014    4:00 PM  CMP  Glucose 70 - 99 mg/dL 112  185  126   BUN 8 - 23 mg/dL _0 Creatinine 0.44 - 1.00 mg/dL 0.63  0.75  0.73   Sodium 135 - 145 mmol/L 140  134  138   Potassium 3.5 - 5.1 mmol/L 4.0  3.4  4.5   Chloride 98 - 111 mmol/L 105  98  103   CO2 22 - 32 mmol/L _1 Calcium 8.9 - 10.3 mg/dL 9.5  8.5  9.6   Total Protein 6.5 - 8.1 g/dL 6.9  7.5    Total Bilirubin 0.3 - 1.2 mg/dL 0.3  1.0    Alkaline Phos 38 - 126 U/L 62  59    AST 15 - 41 U/L 18  14    ALT 0 - 44 U/L 12  10      RADIOGRAPHIC STUDIES: I have personally reviewed the radiological images as listed and agreed with the findings in the report. DG Abd 1 View  Result Date: 11/18/2021 CLINICAL DATA:  Possible retained capsule. EXAM: ABDOMEN - 1 VIEW COMPARISON:  None  Available. FINDINGS: The bowel gas pattern is normal. No radiopaque foreign body is noted. No radio-opaque calculi or other significant radiographic abnormality are seen. Scoliosis and degenerative joint changes of spine identified. IMPRESSION: No radiopaque foreign body identified. Electronically Signed   By: Abelardo Diesel M.D.   On: 11/18/2021 08:41   CT CHEST ABDOMEN PELVIS WO CONTRAST  Result Date: 11/05/2021 CLINICAL DATA:  Abdominal distension. Urinary retention. Shortness of breath. EXAM: CT CHEST, ABDOMEN AND PELVIS WITHOUT CONTRAST TECHNIQUE: Multidetector CT imaging of the chest, abdomen and pelvis was performed following the standard  protocol without IV contrast. RADIATION DOSE REDUCTION: This exam was performed according to the departmental dose-optimization program which includes automated exposure control, adjustment of the mA and/or kV according to patient size and/or use of iterative reconstruction technique. COMPARISON:  Chest radiograph 10/03/2021. Lung cancer screening CT 03/25/2021. No prior abdominal imaging available. FINDINGS: CT CHEST FINDINGS Cardiovascular: Mild-moderate atherosclerosis of the thoracic aorta. No aortic aneurysm or periaortic stranding. The heart is normal in size. There are coronary artery calcifications. No pericardial effusion. Mediastinum/Nodes: No mediastinal adenopathy or mass. No bulky hilar adenopathy on this unenhanced exam, limiting hilar assessment. The esophagus is decompressed. There is a 17 mm low-density right thyroid nodule. This has been evaluated on previous imaging. (ref: J Am Coll Radiol. 2015 Feb;12(2): 143-50).Thyroid ultrasound 03/25/2021 Lungs/Pleura: Chronic biapical pleuroparenchymal scarring. In addition there are irregular clustered irregular nodular opacities within the anterior left upper lobe, series 6 images 27 through 40. This is new from prior exam. Stable 5 mm perifissural nodule along the left major fissure series 6, image 71. No features of pulmonary edema. No pleural effusion. Musculoskeletal: Mild thoracic spondylosis with anterior spurring. There are no acute or suspicious osseous abnormalities. No chest wall soft tissue abnormalities. CT ABDOMEN PELVIS FINDINGS Hepatobiliary: Elongated right lobe of the liver may represent Riedel's lobe configuration. No evidence of focal liver lesion or focal abnormality on this unenhanced exam. Gallbladder physiologically distended, no calcified stone. No biliary dilatation. Pancreas: No ductal dilatation or inflammation. Spleen: Normal in size without focal abnormality. Adrenals/Urinary Tract: No adrenal nodule. Minimal left renal  parenchymal atrophy. No hydronephrosis, renal calculi, or evidence of focal renal lesion. Minimally distended urinary bladder, no bladder wall thickening. There is no abnormal bladder distension. No perivesicular fat stranding. Stomach/Bowel: Unremarkable appearance of the stomach. Equivocal mild focal wall thickening of the fourth portion of the duodenum, series 2, image 76. No adjacent fat stranding. Remaining small bowel is normal. No wall thickening or inflammation. Normal appendix. Moderate volume of colonic stool, primarily proximally. Distal descending and sigmoid colonic diverticulosis. No diverticulitis. No abnormal rectal distention. Vascular/Lymphatic: Aortic atherosclerosis. No aortic aneurysm. There is scattered small central mesenteric and retroperitoneal lymph nodes, none of which are enlarged by size criteria. No suspicious adenopathy. Reproductive: Uterus and bilateral adnexa are unremarkable. Other: No ascites. No abdominopelvic collection. No abdominal wall hernia. Musculoskeletal: Lumbar scoliosis with diffuse lumbar degenerative change. There are no acute or suspicious osseous abnormalities. IMPRESSION: 1. Irregular clustered irregular nodular opacities in the anterior left upper lobe, new from prior lung cancer screening CT. Favor infectious or inflammatory etiology. Given this is not well seen by radiograph, recommend chest CT in 3 months after course of treatment. 2. Equivocal mild focal wall thickening of the fourth portion of the duodenum, without adjacent fat stranding. This may be secondary to peptic ulcer disease, however consider correlation with endoscopy. 3. Colonic diverticulosis without diverticulitis. 4. Normal CT appearance of the urinary bladder. Physiologic bladder distension,  no findings of urinary retention. Aortic Atherosclerosis (ICD10-I70.0). Electronically Signed   By: Keith Rake M.D.   On: 11/05/2021 16:05   ECHOCARDIOGRAM COMPLETE  Result Date: 10/23/2021     ECHOCARDIOGRAM REPORT   Patient Name:   MINNA DUMIRE [facility] Date of Exam: 10/23/2021 Medical Rec #:  [id number]        Height:       68.0 in Accession #:    [id number]       Weight:       191.0 lb Date of Birth:  [birth year]         BSA:          2.004 m Patient Age:    71 years         BP:           115/75 mmHg Patient Gender: F                HR:           56 bpm. Exam Location:  [address] Procedure: 3D Echo, 2D Echo, Cardiac Doppler and Color Doppler Indications:    Atrial Fibrillation I48.0  History:        Patient has prior history of Echocardiogram examinations, most                 recent 04/19/2012. Mitral Valve Prolapse; Risk                 Factors:Hypertension.  Sonographer:    Mikki Santee RDCS Referring Phys: [place]  1. Left ventricular ejection fraction, by estimation, is 60 to 65%. Left ventricular ejection fraction by 3D volume is 59 %. The left ventricle has normal function. The left ventricle has no regional wall motion abnormalities. Left ventricular diastolic  parameters were normal.  2. Right ventricular systolic function is normal. The right ventricular size is mildly enlarged. Tricuspid regurgitation signal is inadequate for assessing PA pressure.  3. Left atrial size was moderately dilated.  4. Right atrial size was mildly dilated.  5. The mitral valve is normal in structure. Mild mitral valve regurgitation.  6. The aortic valve is tricuspid. Aortic valve regurgitation is not visualized. No aortic stenosis is present.  7. The inferior vena cava is normal in size with greater than 50% respiratory variability, suggesting right atrial pressure of 3 mmHg. Comparison(s): Prior images unable to be directly viewed, comparison made by report only. FINDINGS  Left Ventricle: Left ventricular ejection fraction, by estimation, is 60 to 65%. Left ventricular ejection fraction by 3D volume is 59 %. The left ventricle has normal function. The left ventricle has no regional wall motion  abnormalities. The left ventricular internal cavity size was normal in size. There is no left ventricular hypertrophy. Left ventricular diastolic parameters were normal. Right Ventricle: The right ventricular size is mildly enlarged. No increase in right ventricular wall thickness. Right ventricular systolic function is normal. Tricuspid regurgitation signal is inadequate for assessing PA pressure. Left Atrium: Left atrial size was moderately dilated. Right Atrium: Right atrial size was mildly dilated. Pericardium: There is no evidence of pericardial effusion. Mitral Valve: The mitral valve is normal in structure. Mild mitral valve regurgitation, with centrally-directed jet. Tricuspid Valve: The tricuspid valve is normal in structure. Tricuspid valve regurgitation is not demonstrated. Aortic Valve: The aortic valve is tricuspid. Aortic valve regurgitation is not visualized. No aortic stenosis is present. Pulmonic Valve: The pulmonic valve was normal in structure. Pulmonic valve regurgitation is trivial. Aorta: The  aortic root and ascending aorta are structurally normal, with no evidence of dilitation. Venous: The inferior vena cava is normal in size with greater than 50% respiratory variability, suggesting right atrial pressure of 3 mmHg. IAS/Shunts: No atrial level shunt detected by color flow Doppler.  LEFT VENTRICLE PLAX 2D LVIDd:         5.10 cm         Diastology LVIDs:         3.30 cm         LV e' medial:    5.66 cm/s LV PW:         0.90 cm         LV E/e' medial:  13.9 LV IVS:        1.00 cm         LV e' lateral:   12.50 cm/s LVOT diam:     2.30 cm         LV E/e' lateral: 6.3 LV SV:         88 LV SV Index:   44 LVOT Area:     4.15 cm        3D Volume EF                                LV 3D EF:    Left                                             ventricul                                             ar                                             ejection                                             fraction                                              by 3D                                             volume is                                             59 %.                                 3D Volume EF:  3D EF:        59 %                                LV EDV:       153 ml                                LV ESV:       63 ml                                LV SV:        90 ml RIGHT VENTRICLE RV Basal diam:  4.30 cm RV Mid diam:    2.50 cm RV S prime:     13.10 cm/s TAPSE (M-mode): 2.5 cm LEFT ATRIUM              Index        RIGHT ATRIUM           Index LA diam:        3.90 cm  1.95 cm/m   RA Area:     22.20 cm LA Vol (A2C):   102.0 ml 50.89 ml/m  RA Volume:   67.90 ml  33.88 ml/m LA Vol (A4C):   79.9 ml  39.87 ml/m LA Biplane Vol: 92.0 ml  45.91 ml/m  AORTIC VALVE LVOT Vmax:   90.70 cm/s LVOT Vmean:  61.300 cm/s LVOT VTI:    0.213 m  AORTA Ao Root diam: 3.00 cm Ao Asc diam:  3.50 cm MITRAL VALVE MV Area (PHT): 4.06 cm    SHUNTS MV Decel Time: 187 msec    Systemic VTI:  0.21 m MV E velocity: 78.60 cm/s  Systemic Diam: 2.30 cm MV A velocity: 53.50 cm/s MV E/A ratio:  1.47 Mihai Croitoru MD Electronically signed by Sanda Klein MD Signature Date/Time: 10/23/2021/12:37:39 PM    Final     ASSESSMENT & PLAN Kellie Reynolds is a 66 y.o. who presents to the clinic for evaluation for iron deficiency anemia. She is currently taking ferrous sulfate 325 mg once daily since July 2023. 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   Patient will proceed with labs today to recheck Hgb and iron panel. If there is evidence of persistent or worsening anemia, recommend IV iron to help bolster iron levels.   #Iron deficiency anemia 2/2 chronic blood loss: --Currently on ferrous sulfate 325 mg once a day with a source of vitamin C --Patient follows vegetarian diet so provided list of  iron rich foods to incorporate into diet.  --Under the care of Dr. Candis Schatz (GI) and underwent EGD/colonoscopy from 06/05/2021 showed no evidence of active bleeding --CT scan from 11/04/2021 showed mild focal wall thickening of the fourth portion of the duodenum. Will discuss with GI if capsular endoscopy is recommended. --Labs today to check CBC, CMP, retic panel, ferritin, iron and TIBC. --Consider IV iron infusions to help bolster iron levels if Hgb is <10.  --RTC in 3 months with repeat labs  #H/o left breast cancer --Diagnosed in June 2022 and treated at [facility] lumpectomy on 07/29/2020. 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 --Not enough tissue for Oncotype testing.  --Received adjust radiation to the left breast 10/15/2020-11/11/2020.  --Started adjuvant endocrine therapy with anastrozole 1 mg daily on 11/24/2020. Plan for total of 7 years of therapy.  --Last mammogram was on 05/18/2021 that showed no evidence of malignancies. Next mammogram due in March 2024.  --Patient requested to transfer care to [facility] due to convenience of travel.   Orders Placed This Encounter  Procedures   CBC with Differential ([place] Only)    Standing Status:   Future    Number of Occurrences:   1    Standing Expiration Date:   11/18/2022   CMP ([place] only)    Standing Status:   Future    Number of Occurrences:   1    Standing Expiration Date:   11/18/2022   Ferritin    Standing Status:   Future    Number of Occurrences:   1    Standing Expiration Date:   11/18/2022   Iron and Iron Binding Capacity ([facility] only)    Standing Status:   Future    Number of Occurrences:   1    Standing Expiration Date:   11/18/2022   Retic Panel    Standing Status:   Future    Number of Occurrences:   1    Standing Expiration Date:   11/18/2022    All questions were answered. The patient knows to  call the clinic with any problems, questions or concerns.  I have spent a total of 60 minutes minutes of face-to-face and non-face-to-face time, preparing to see the patient, obtaining and/or reviewing separately obtained history, performing a medically appropriate examination, counseling and educating the patient, ordering tests/procedures,  communicating with other health care professionals,  and care coordination.   Dede Query, PA-C Department of Hematology/Oncology [place] at [facility] Phone: [phone]  Patient was seen with Dr. Lorenso Courier  I have read the above note and personally examined the patient. I agree with the assessment and plan as noted above.  Briefly Mrs. Brumett is a 66 year old female who presents for evaluation of iron deficiency anemia.  She is currently under evaluation by GI and has not been found to have a source of GI bleeding.  Additionally the patient is currently taking p.o. iron therapy.  Today we will recheck her levels and if her iron levels and hemoglobin remain low we will consider IV iron therapy.  If appropriately rising recommend continued p.o. therapy with follow-up in 3 months time.  Patient voiced understanding of the plan moving forward.   Ledell Peoples, MD Department of Hematology/Oncology [place] at [facility] Phone: [phone] Pager: [phone] Email: [email or web address]

## 2021-11-24 ENCOUNTER — Telehealth: Payer: Self-pay

## 2021-11-24 ENCOUNTER — Telehealth: Payer: Self-pay | Admitting: Gastroenterology

## 2021-11-24 DIAGNOSIS — D500 Iron deficiency anemia secondary to blood loss (chronic): Secondary | ICD-10-CM

## 2021-11-24 NOTE — Telephone Encounter (Signed)
[facility] Pre-operative Risk Assessment     Request for surgical clearance:     Endoscopy Procedure  What type of surgery is being performed?     Enteroscopy  When is this surgery scheduled?     12/07/21  What type of clearance is required ?   Pharmacy  Are there any medications that need to be held prior to surgery and how long? Xarelto-2 days  Practice name and name of physician performing surgery?      [facility] Dr. Candis Schatz  What is your office phone and fax number?      Phone- [phone]  [phone]  Anesthesia type (None, local, MAC, general) ?       MAC

## 2021-11-24 NOTE — Telephone Encounter (Signed)
I called the patient today and reviewed her capsule endoscopy study.  Unfortunately, the capsule did not make it out of her stomach, so little information was gained from the study.  A subsequent plain film sure that the capsule eventually passed. The patient's hemoglobin and iron studies have normalized with oral iron supplementation. The patient had a CT scan in late August which showed questionable inflammation in the fourth portion of the duodenum. The patient continues to have symptoms of diarrhea and foul-smelling stools, and occasional bouts of nausea, but denies abdominal pain or vomiting.  Given the patient's anemia and abnormal finding on CT scan, I think a repeat upper endoscopy is reasonable.  We will plan for a push enteroscopy to ensure that we were able to reach the area of concern on the CT scan.  If the enteroscopy is unremarkable, I would recommend continuing close monitoring of the patient's blood counts.  If her anemia recurs, then we can reattempt a capsule endoscopy, perhaps with addition of metoclopramide.

## 2021-11-24 NOTE — Telephone Encounter (Signed)
-----   Message from Daryel November, MD sent at 11/24/2021  8:17 AM EDT ----- Vaughan Basta,  Can you please help Ms. Wieand get set up for a push enteroscopy in the [facility] to evaluate abnormal CT scan and iron deficiency anemia?  Thanks

## 2021-11-24 NOTE — Telephone Encounter (Signed)
Left message for pt to call back to schedule push enteroscopy in the [facility]. Pt is on xarelto.

## 2021-11-24 NOTE — Telephone Encounter (Signed)
Pt scheduled 12/07/21 in the [facility] at 10am, pt to arrive there at [facility] clearance sent to hold xarelto. Pt aware of appt. Amb ref in epic.

## 2021-11-25 NOTE — Telephone Encounter (Signed)
Patient with diagnosis of afib on Xarelto for anticoagulation.    Procedure:  Enteroscopy Date of procedure: 12/07/21   CHA2DS2-VASc Score = 3   This indicates a 3.2% annual risk of stroke. The patient's score is based upon: CHF History: 0 HTN History: 1 Diabetes History: 0 Stroke History: 0 Vascular Disease History: 1 Age Score: 0 Gender Score: 1      CrCl 100 ml/min  Per office protocol, patient can hold Xarelto for 2 days prior to procedure.    **This guidance is not considered finalized until pre-operative APP has relayed final recommendations.**

## 2021-11-25 NOTE — Telephone Encounter (Signed)
   Patient Name: Kellie Reynolds  DOB: 16-Jul-1955 MRN: [id number]  Primary Cardiologist: Thompson Grayer, MD  Clinical pharmacists have reviewed the patient's past medical history, labs, and current medications as part of preoperative protocol coverage. The following recommendations have been made:  Patient with diagnosis of afib on Xarelto for anticoagulation.     Procedure:  Enteroscopy Date of procedure: 12/07/21     CHA2DS2-VASc Score = 3   This indicates a 3.2% annual risk of stroke. The patient's score is based upon: CHF History: 0 HTN History: 1 Diabetes History: 0 Stroke History: 0 Vascular Disease History: 1 Age Score: 0 Gender Score: 1       CrCl 100 ml/min   Per office protocol, patient can hold Xarelto for 2 days prior to procedure. Please resume Xarelto as soon as possible postprocedure at the discretion of the surgeon.    I will route this recommendation to the requesting party via Epic fax function and remove from pre-op pool.  Please call with questions.  Lenna Sciara, NP 11/25/2021, 4:37 PM

## 2021-11-26 ENCOUNTER — Telehealth: Payer: Self-pay | Admitting: Physician Assistant

## 2021-11-26 ENCOUNTER — Telehealth: Payer: Self-pay

## 2021-11-26 NOTE — Telephone Encounter (Signed)
Per 9/14 secure chat called and spoke to pt about appointment

## 2021-11-26 NOTE — Telephone Encounter (Signed)
Spoke with pt and let her know she needs to hold her xarelto for 2 days prior to her enteroscopy. Pt verbalized understanding.

## 2021-11-26 NOTE — Telephone Encounter (Signed)
plese notify patient that since iron levels and Hgb have improved, no need for IV iron. okay to continue iron pills  Pt advised with verbal understanding

## 2021-12-01 ENCOUNTER — Encounter: Payer: Self-pay | Admitting: Gastroenterology

## 2021-12-04 ENCOUNTER — Encounter: Payer: Self-pay | Admitting: Gastroenterology

## 2021-12-07 ENCOUNTER — Ambulatory Visit (AMBULATORY_SURGERY_CENTER): Payer: Medicare HMO | Admitting: Gastroenterology

## 2021-12-07 ENCOUNTER — Encounter: Payer: Self-pay | Admitting: Gastroenterology

## 2021-12-07 ENCOUNTER — Other Ambulatory Visit: Payer: Self-pay | Admitting: Gastroenterology

## 2021-12-07 VITALS — BP 177/73 | HR 60 | Temp 98.7°F | Resp 16 | Ht 68.0 in | Wt 187.0 lb

## 2021-12-07 DIAGNOSIS — R933 Abnormal findings on diagnostic imaging of other parts of digestive tract: Secondary | ICD-10-CM

## 2021-12-07 DIAGNOSIS — D500 Iron deficiency anemia secondary to blood loss (chronic): Secondary | ICD-10-CM

## 2021-12-07 DIAGNOSIS — K319 Disease of stomach and duodenum, unspecified: Secondary | ICD-10-CM

## 2021-12-07 DIAGNOSIS — K449 Diaphragmatic hernia without obstruction or gangrene: Secondary | ICD-10-CM | POA: Diagnosis not present

## 2021-12-07 MED ORDER — SODIUM CHLORIDE 0.9 % IV SOLN: 500.0000 mL | Freq: Once | INTRAVENOUS | Status: DC

## 2021-12-07 NOTE — Progress Notes (Signed)
Report to PACU, RN, vss, BBS= Clear.  

## 2021-12-07 NOTE — Patient Instructions (Signed)
   Resume usual diet and medications- OK to resume Eliquis   Continue to monitor CBC/Iron labs   Await biopsy results     YOU HAD AN ENDOSCOPIC PROCEDURE TODAY AT [facility]:   Refer to the procedure report that was given to you for any specific questions about what was found during the examination.  If the procedure report does not answer your questions, please call your gastroenterologist to clarify.  If you requested that your care partner not be given the details of your procedure findings, then the procedure report has been included in a sealed envelope for you to review at your convenience later.  YOU SHOULD EXPECT: Some feelings of bloating in the abdomen. Passage of more gas than usual.  Walking can help get rid of the air that was put into your GI tract during the procedure and reduce the bloating. If you had a lower endoscopy (such as a colonoscopy or flexible sigmoidoscopy) you may notice spotting of blood in your stool or on the toilet paper. If you underwent a bowel prep for your procedure, you may not have a normal bowel movement for a few days.  Please Note:  You might notice some irritation and congestion in your nose or some drainage.  This is from the oxygen used during your procedure.  There is no need for concern and it should clear up in a day or so.  SYMPTOMS TO REPORT IMMEDIATELY:    Following upper endoscopy (EGD)  Vomiting of blood or coffee ground material  New chest pain or pain under the shoulder blades  Painful or persistently difficult swallowing  New shortness of breath  Fever of 100F or higher  Black, tarry-looking stools  For urgent or emergent issues, a gastroenterologist can be reached at any hour by calling [phone]. Do not use MyChart messaging for urgent concerns.    DIET:  We do recommend a small meal at first, but then you may proceed to your regular diet.  Drink plenty of fluids but you should avoid alcoholic beverages  for 24 hours.  ACTIVITY:  You should plan to take it easy for the rest of today and you should NOT DRIVE or use heavy machinery until tomorrow (because of the sedation medicines used during the test).    FOLLOW UP: Our staff will call the number listed on your records the next business day following your procedure.  We will call around 7:15- 8:00 am to check on you and address any questions or concerns that you may have regarding the information given to you following your procedure. If we do not reach you, we will leave a message.     If any biopsies were taken you will be contacted by phone or by letter within the next 1-3 weeks.  Please call us at [phone] if you have not heard about the biopsies in 3 weeks.    SIGNATURES/CONFIDENTIALITY: You and/or your care partner have signed paperwork which will be entered into your electronic medical record.  These signatures attest to the fact that that the information above on your After Visit Summary has been reviewed and is understood.  Full responsibility of the confidentiality of this discharge information lies with you and/or your care-partner.

## 2021-12-07 NOTE — Op Note (Signed)
Kellie Reynolds: Kellie Reynolds Procedure Date: 12/07/2021 10:38 AM MRN: [id number] Endoscopist: Nicki Reaper E. Candis Schatz , MD Age: 66 Referring MD:  Date of Birth: 09-17-1955 Gender: Female Account #: [id number] Procedure:                Small bowel enteroscopy Indications:              Abnormal abdominal CT, Iron deficiency anemia Medicines:                Monitored Anesthesia Care Procedure:                Pre-Anesthesia Assessment:                           - Prior to the procedure, a History and Physical                            was performed, and patient medications and                            allergies were reviewed. The patient's tolerance of                            previous anesthesia was also reviewed. The risks                            and benefits of the procedure and the sedation                            options and risks were discussed with the patient.                            All questions were answered, and informed consent                            was obtained. Prior Anticoagulants: The patient has                            taken Eliquis (apixaban), last dose was 3 days                            prior to procedure. ASA Grade Assessment: II - A                            patient with mild systemic disease. After reviewing                            the risks and benefits, the patient was deemed in                            satisfactory condition to undergo the procedure.                           After obtaining informed consent, the endoscope was  passed under direct vision. Throughout the                            procedure, the patient's blood pressure, pulse, and                            oxygen saturations were monitored continuously. The                            Olympus PCF-[id number] (#[id number]) Colonoscope was                            introduced through the mouth, and advanced to the                             proximal jejunum. After obtaining informed consent,                            the endoscope was passed under direct vision.                            Throughout the procedure, the patient's blood                            pressure, pulse, and oxygen saturations were                            monitored continuously.The small bowel enteroscopy                            was accomplished without difficulty. The patient                            tolerated the procedure well. Scope In: Scope Out: Findings:                 The examined esophagus was normal.                           A 4 cm hiatal hernia was present.                           Diffuse granular mucosa was found in the gastric                            antrum. Biopsies were taken with a cold forceps for                            Helicobacter pylori testing. Estimated blood loss                            was minimal.                           The exam of the stomach was otherwise normal.  There was no evidence of significant pathology in                            the entire examined duodenum. Biopsies for                            histology were taken with a cold forceps for                            evaluation of celiac disease. Estimated blood loss                            was minimal.                           There was no evidence of significant pathology in                            the proximal jejunum. Complications:            No immediate complications. Estimated Blood Loss:     Estimated blood loss was minimal. Impression:               - Normal esophagus.                           - 4 cm hiatal hernia.                           - Granular gastric mucosa. Biopsied.                           - Normal examined duodenum. Biopsied.                           - The examined portion of the jejunum was normal.                           - No abnormalities to correspond to CT                             abnormalities or to explain iron deficiency anemia Recommendation:           - Discharge patient to home (with escort).                           - Patient has a contact number available for                            emergencies. The signs and symptoms of potential                            delayed complications were discussed with the                            patient. Return to normal activities tomorrow.  Written discharge instructions were provided to the                            patient.                           - Resume previous diet.                           - Await pathology results.                           - Continue present medications. Ok to resume                            Eliquis today.                           - Continue to monitor CBC/iron.                           - Repeat VCE if anemia/iron deficiency recurs. Kellie Reynolds E. Candis Schatz, MD 12/07/2021 11:03:02 AM This report has been signed electronically.

## 2021-12-07 NOTE — Progress Notes (Signed)
Called to room to assist during endoscopic procedure.  Patient ID and intended procedure confirmed with present staff. Received instructions for my participation in the procedure from the performing physician.  

## 2021-12-07 NOTE — Progress Notes (Signed)
[facility] History and Physical   Primary Care Physician:  Barnetta Chapel, NP   Reason for Procedure:   Imaging abnormal, iron deficiency anemia  Plan:    Push enteroscopy     HPI: Kellie Reynolds is a 66 y.o. female undergoing push enteroscopy to evaluate CT abnormalities.  She underwent a CT in August which showed suspected inflammatory changes in the fourth portion of the duodenum.  She has iron deficiency anemia which has improved with oral iron.  An EGD and colonoscopy were unrevealing for a source of anemia.  A subsequent capsule endoscopy was incomplete as the capsule did not exit the stomach during the recording.  She is bothered by chronic diarrhea and foul-smelling stools, but denies abdominal pain.  She was having melena in the past but no longer.  She takes Xarelto for a-fib, last dose 9/22.   Past Medical History:  Diagnosis Date   Arthritis    right knee   Family history of adverse reaction to anesthesia 58 yrs ago   daughter had malignant hyperthermia reaction with acentine, daughter has had surgeries since then and did welll   Fibrocystic breast disease    GERD (gastroesophageal reflux disease)    HTN (hypertension)    Hypercholesteremia    IDA (iron deficiency anemia)    MVP (mitral valve prolapse)    Paroxysmal atrial fibrillation (HCC)    a. s/p PVI   Skin cancer    Sleep apnea    no longer use CPAP    Past Surgical History:  Procedure Laterality Date   ATRIAL FIBRILLATION ABLATION N/A 04/18/2012   PVI by Allred   BREAST LUMPECTOMY  2022   COLONOSCOPY WITH PROPOFOL N/A 11/24/2015   Procedure: COLONOSCOPY WITH PROPOFOL;  Surgeon: Garlan Fair, MD;  Location: [facility];  Service: Endoscopy;  Laterality: N/A;   EYE SURGERY Bilateral    lasix eye surgery both eyes   MENISECTOMY     Right knee   NASAL POLYP SURGERY  1990   NASAL SINUS SURGERY  1990   TEE WITHOUT CARDIOVERSION  04/17/2012   Procedure: TRANSESOPHAGEAL ECHOCARDIOGRAM  (TEE);  Surgeon: Jettie Booze, MD;  Location: [facility];  Service: Cardiovascular;  Laterality: N/A;  pre ablation   TUBAL LIGATION      Prior to Admission medications   Medication Sig Start Date End Date Taking? Authorizing Provider  acetaminophen (TYLENOL) 500 MG tablet Take 1,500 mg by mouth 3 (three) times daily as needed for headache.   Yes [provider]  alendronate (FOSAMAX) 70 MG tablet Take 70 mg by mouth once a week. 05/04/21  Yes [provider]  atorvastatin (LIPITOR) 20 MG tablet Take 20 mg by mouth daily.   Yes [provider]  citalopram (CELEXA) 10 MG tablet Take 10 mg by mouth daily. 12/28/18  Yes [provider]  ferrous sulfate 325 (65 FE) MG tablet Take 325 mg by mouth daily. 10/05/21  Yes [provider]  flecainide (TAMBOCOR) 100 MG tablet Take 0.5 tablets (50 mg total) by mouth 2 (two) times daily. 04/20/21  Yes Allred, Jeneen Rinks, MD  guaiFENesin (MUCINEX) 600 MG 12 hr tablet Take 600 mg by mouth daily as needed (allergies).   Yes [provider]  letrozole (FEMARA) 2.5 MG tablet Take 2.5 mg by mouth daily. 04/24/21  Yes [provider]  losartan (COZAAR) 50 MG tablet Take 50 mg by mouth in the morning and at bedtime. 01/01/20  Yes [provider]  methimazole (  TAPAZOLE) 5 MG tablet Take 0.5 tablets (2.5 mg total) by mouth daily. 11/03/21  Yes Shamleffer, Melanie Crazier, MD  metoprolol tartrate (LOPRESSOR) 50 MG tablet TAKE 0.5 TABLETS BY MOUTH 2 TIMES DAILY. Patient taking differently: Take 25 mg by mouth 2 (two) times daily. 01/26/21  Yes Allred, Jeneen Rinks, MD  omeprazole (PRILOSEC) 40 MG capsule TAKE 1 CAPSULE BY MOUTH 2 (TWO) TIMES DAILY 20-30 MINUTES BEFORE MEALS. Patient taking differently: Take 40 mg by mouth in the morning and at bedtime. 08/25/21  Yes Daryel November, MD  sodium chloride (OCEAN) 0.65 % nasal spray Place 1 spray into the nose 2 (two) times daily as needed. For dryness   Yes  [provider]  Cholecalciferol (VITAMIN D3) 1.25 MG (50000 UT) CAPS Take 1 capsule by mouth once a week. Patient not taking: Reported on 12/07/2021 02/25/21   [provider]  XARELTO 20 MG TABS tablet TAKE 1 TABLET BY MOUTH DAILY WITH SUPPER Patient taking differently: Take 20 mg by mouth daily with supper. 07/30/21   Thompson Grayer, MD    Current Outpatient Medications  Medication Sig Dispense Refill   acetaminophen (TYLENOL) 500 MG tablet Take 1,500 mg by mouth 3 (three) times daily as needed for headache.     alendronate (FOSAMAX) 70 MG tablet Take 70 mg by mouth once a week.     atorvastatin (LIPITOR) 20 MG tablet Take 20 mg by mouth daily.     citalopram (CELEXA) 10 MG tablet Take 10 mg by mouth daily.     ferrous sulfate 325 (65 FE) MG tablet Take 325 mg by mouth daily.     flecainide (TAMBOCOR) 100 MG tablet Take 0.5 tablets (50 mg total) by mouth 2 (two) times daily. 90 tablet 3   guaiFENesin (MUCINEX) 600 MG 12 hr tablet Take 600 mg by mouth daily as needed (allergies).     letrozole (FEMARA) 2.5 MG tablet Take 2.5 mg by mouth daily.     losartan (COZAAR) 50 MG tablet Take 50 mg by mouth in the morning and at bedtime.     methimazole (TAPAZOLE) 5 MG tablet Take 0.5 tablets (2.5 mg total) by mouth daily. 45 tablet 3   metoprolol tartrate (LOPRESSOR) 50 MG tablet TAKE 0.5 TABLETS BY MOUTH 2 TIMES DAILY. (Patient taking differently: Take 25 mg by mouth 2 (two) times daily.) 90 tablet 3   omeprazole (PRILOSEC) 40 MG capsule TAKE 1 CAPSULE BY MOUTH 2 (TWO) TIMES DAILY 20-30 MINUTES BEFORE MEALS. (Patient taking differently: Take 40 mg by mouth in the morning and at bedtime.) 180 capsule 1   sodium chloride (OCEAN) 0.65 % nasal spray Place 1 spray into the nose 2 (two) times daily as needed. For dryness     Cholecalciferol (VITAMIN D3) 1.25 MG (50000 UT) CAPS Take 1 capsule by mouth once a week. (Patient not taking: Reported on 12/07/2021)     XARELTO 20 MG TABS tablet TAKE  1 TABLET BY MOUTH DAILY WITH SUPPER (Patient taking differently: Take 20 mg by mouth daily with supper.) 30 tablet 5   Current Facility-Administered Medications  Medication Dose Route Frequency Provider Last Rate Last Admin   0.9 %  sodium chloride infusion  500 mL Intravenous Once Daryel November, MD        Allergies as of 12/07/2021 - Review Complete 12/07/2021  Allergen Reaction Noted   Anastrozole Other (See Comments) 01/01/2021   Latex Itching, Rash, and Other (See Comments) 12/21/2019    Family History  Problem Relation  Age of Onset   Diabetes Mother    Hyperlipidemia Father    Hypertension Father    CAD Father    Heart attack Father    Liver cancer Maternal Grandmother    Leukemia Maternal Grandfather    Colon cancer Neg Hx    Esophageal cancer Neg Hx    Stomach cancer Neg Hx    Rectal cancer Neg Hx     Social History   Socioeconomic History   Marital status: Divorced    Spouse name: Not on file   Number of children: 1   Years of education: Not on file   Highest education level: Not on file  Occupational History   Not on file  Tobacco Use   Smoking status: Every Day    Packs/day: 1.00    Years: 50.00    Total pack years: 50.00    Types: Cigarettes   Smokeless tobacco: Never  Vaping Use   Vaping Use: Never used  Substance and Sexual Activity   Alcohol use: Yes    Comment: 4 beers oper day, regular sized beers   Drug use: No   Sexual activity: Not on file  Other Topics Concern   Not on file  Social History Narrative   Not on file   Social Determinants of Health   Financial Resource Strain: Not on file  Food Insecurity: Not on file  Transportation Needs: Not on file  Physical Activity: Not on file  Stress: Not on file  Social Connections: Not on file  Intimate Partner Violence: Not on file    Review of Systems:  All other review of systems negative except as mentioned in the HPI.  Physical Exam: Vital signs BP (!) 168/84 (BP Location:  Right Arm, Patient Position: Sitting, Cuff Size: Normal)   Pulse 61   Temp 98.7 F (37.1 C) (Temporal)   Ht '5\' 8"'$  (1.727 m)   Wt 187 lb (84.8 kg)   SpO2 93%   BMI 28.43 kg/m   General:   Alert,  Well-developed, well-nourished, pleasant and cooperative in NAD Airway:  Mallampati 1 Lungs:  Clear throughout to auscultation.   Heart:  Regular rate and rhythm; no murmurs, clicks, rubs,  or gallops. Abdomen:  Soft, nontender and nondistended. Normal bowel sounds.   Neuro/Psych:  Normal mood and affect. A and O x 3   Apryl Brymer E. Candis Schatz, MD [facility]

## 2021-12-07 NOTE — Progress Notes (Signed)
Vitals-JF  Pt's states no medical or surgical changes since previsit or office visit.

## 2021-12-08 ENCOUNTER — Telehealth: Payer: Self-pay

## 2021-12-08 NOTE — Telephone Encounter (Signed)
  Follow up Call-     12/07/2021   10:07 AM 12/07/2021    9:54 AM 06/05/2021    8:37 AM  Call back number  Post procedure Call Back phone  # [phone]  [phone]  Permission to leave phone message  Yes Yes     Patient questions:  Do you have a fever, pain , or abdominal swelling? No. Pain Score  0 *  Have you tolerated food without any problems? Yes.    Have you been able to return to your normal activities? Yes.    Do you have any questions about your discharge instructions: Diet   No. Medications  No. Follow up visit  No.  Do you have questions or concerns about your Care? No.  Actions: * If pain score is 4 or above: No action needed, pain <4.

## 2021-12-13 NOTE — Progress Notes (Signed)
Kellie Reynolds,  The biopsies taken from your stomach were notable for mild reactive gastropathy which is a common finding and often related to use of certain medications (usually NSAIDs), but there was no evidence of Helicobacter pylori infection. This common finding is not felt to necessarily be a cause of any particular symptom and there is no specific treatment or further evaluation recommended. The biopsies of your duodenum were normal with no evidence of celiac disease.  I recommend we repeat a CBC and iron panel in 3 months.

## 2022-01-16 ENCOUNTER — Ambulatory Visit
Admission: EM | Admit: 2022-01-16 | Discharge: 2022-01-16 | Disposition: A | Discharge status: Home / Self Care | Payer: Medicare HMO

## 2022-01-16 DIAGNOSIS — R053 Chronic cough: Secondary | ICD-10-CM | POA: Diagnosis not present

## 2022-01-16 DIAGNOSIS — R042 Hemoptysis: Secondary | ICD-10-CM | POA: Diagnosis not present

## 2022-01-16 NOTE — ED Provider Notes (Signed)
[facility]    CSN: [id number] Arrival date & time: 01/16/22  0844      History   Chief Complaint Chief Complaint  Patient presents with   Cough    HPI Kellie Reynolds is a 66 y.o. female.   Patient presents with persistent cough and nasal drainage that has been present for about 3 weeks.  She states that she was treated with amoxicillin about a week into symptoms with temporary improvement.  Then, symptoms returned and have seemed to worsen.  She denies chest pain but does endorse some intermittent shortness of breath.  Denies any known fevers or sick contacts.  Denies sore throat, ear pain, nausea, vomiting, diarrhea, abdominal pain.  Patient reports that she became concerned last night given that she started coughing up blood.  Denies history of asthma or COPD but patient does smoke cigarettes.   Cough   Past Medical History:  Diagnosis Date   Arthritis    right knee   Family history of adverse reaction to anesthesia 48 yrs ago   daughter had malignant hyperthermia reaction with acentine, daughter has had surgeries since then and did welll   Fibrocystic breast disease    GERD (gastroesophageal reflux disease)    HTN (hypertension)    Hypercholesteremia    IDA (iron deficiency anemia)    MVP (mitral valve prolapse)    Paroxysmal atrial fibrillation (HCC)    a. s/p PVI   Skin cancer    Sleep apnea    no longer use CPAP    Patient Active Problem List   Diagnosis Date Noted   Subclinical hypothyroidism 06/22/2021   Multinodular goiter 06/22/2021   Anxiety 04/20/2021   Arthritis 04/20/2021   Gastroesophageal reflux disease 04/20/2021   History of adenomatous polyp of colon 04/20/2021   Prediabetes 04/20/2021   Tobacco dependence 04/20/2021   Aortic atherosclerosis ([place]) 04/20/2021   Malignant neoplasm of upper-outer quadrant of left breast in female, estrogen receptor positive ([place]) 08/14/2020   Tobacco use disorder 01/29/2013   Atrial flutter  ([place]) 03/19/2012   Atrial fibrillation ([place]) 05/10/2011   High cholesterol 05/10/2011   Hypertension 05/10/2011    Past Surgical History:  Procedure Laterality Date   ATRIAL FIBRILLATION ABLATION N/A 04/18/2012   PVI by Allred   BREAST LUMPECTOMY  2022   COLONOSCOPY WITH PROPOFOL N/A 11/24/2015   Procedure: COLONOSCOPY WITH PROPOFOL;  Surgeon: Garlan Fair, MD;  Location: [facility];  Service: Endoscopy;  Laterality: N/A;   EYE SURGERY Bilateral    lasix eye surgery both eyes   MENISECTOMY     Right knee   NASAL POLYP SURGERY  1990   NASAL SINUS SURGERY  1990   TEE WITHOUT CARDIOVERSION  04/17/2012   Procedure: TRANSESOPHAGEAL ECHOCARDIOGRAM (TEE);  Surgeon: Jettie Booze, MD;  Location: [place];  Service: Cardiovascular;  Laterality: N/A;  pre ablation   TUBAL LIGATION      OB History   No obstetric history on file.      Home Medications    Prior to Admission medications   Medication Sig Start Date End Date Taking? Authorizing Provider  acetaminophen (TYLENOL) 500 MG tablet Take 1,500 mg by mouth 3 (three) times daily as needed for headache.    [provider]  alendronate (FOSAMAX) 70 MG tablet Take 70 mg by mouth once a week. 05/04/21   [provider]  atorvastatin (LIPITOR) 20 MG tablet Take 20 mg by mouth daily.    [provider]  Cholecalciferol (VITAMIN D3) 1.25 MG (50000 UT) CAPS Take 1 capsule by mouth once a week. Patient not taking: Reported on 12/07/2021 02/25/21   [provider]  citalopram (CELEXA) 10 MG tablet Take 10 mg by mouth daily. 12/28/18   [provider]  ferrous sulfate 325 (65 FE) MG tablet Take 325 mg by mouth daily. 10/05/21   [provider]  flecainide (TAMBOCOR) 100 MG tablet Take 0.5 tablets (50 mg total) by mouth 2 (two) times daily. 04/20/21   Allred, Jeneen Rinks, MD  guaiFENesin (MUCINEX) 600 MG 12 hr tablet Take 600 mg by mouth daily as needed (allergies).    [provider]  letrozole (FEMARA) 2.5 MG tablet Take 2.5 mg by mouth daily. 04/24/21   [provider]  losartan (COZAAR) 50 MG tablet Take 50 mg by mouth in the morning and at bedtime. 01/01/20   [provider]  methimazole (TAPAZOLE) 5 MG tablet Take 0.5 tablets (2.5 mg total) by mouth daily. 11/03/21   Shamleffer, Melanie Crazier, MD  metoprolol tartrate (LOPRESSOR) 50 MG tablet TAKE 0.5 TABLETS BY MOUTH 2 TIMES DAILY. Patient taking differently: Take 25 mg by mouth 2 (two) times daily. 01/26/21   Allred, Jeneen Rinks, MD  omeprazole (PRILOSEC) 40 MG capsule TAKE 1 CAPSULE BY MOUTH 2 (TWO) TIMES DAILY 20-30 MINUTES BEFORE MEALS. Patient taking differently: Take 40 mg by mouth in the morning and at bedtime. 08/25/21   Daryel November, MD  sodium chloride (OCEAN) 0.65 % nasal spray Place 1 spray into the nose 2 (two) times daily as needed. For dryness    [provider]  XARELTO 20 MG TABS tablet TAKE 1 TABLET BY MOUTH DAILY WITH SUPPER Patient taking differently: Take 20 mg by mouth daily with supper. 07/30/21   Allred, Jeneen Rinks, MD    Family History Family History  Problem Relation Age of Onset   Diabetes Mother    Hyperlipidemia Father    Hypertension Father    CAD Father    Heart attack Father    Liver cancer Maternal Grandmother    Leukemia Maternal Grandfather    Colon cancer Neg Hx    Esophageal cancer Neg Hx    Stomach cancer Neg Hx    Rectal cancer Neg Hx     Social History Social History   Tobacco Use   Smoking status: Every Day    Packs/day: 1.00    Years: 50.00    Total pack years: 50.00    Types: Cigarettes   Smokeless tobacco: Never  Vaping Use   Vaping Use: Never used  Substance Use Topics   Alcohol use: Yes    Comment: 4 beers oper day, regular sized beers   Drug use: No     Allergies   Anastrozole and Latex   Review of Systems Review of Systems Per HPI  Physical Exam Triage Vital Signs ED Triage Vitals  Enc Vitals Group      BP 01/16/22 0908 125/83     Pulse Rate 01/16/22 0908 100     Resp 01/16/22 0908 18     Temp 01/16/22 0908 98.9 F (37.2 C)     Temp Source 01/16/22 0908 Oral     SpO2 01/16/22 0908 94 %     Weight --      Height --      Head Circumference --      Peak Flow --      Pain Score 01/16/22 0907 4     Pain Loc --  Pain Edu? --      Excl. in [place]? --    No data found.  Updated Vital Signs BP 125/83 (BP Location: Left Arm)   Pulse 100   Temp 98.9 F (37.2 C) (Oral)   Resp 18   SpO2 94%   Visual Acuity Right Eye Distance:   Left Eye Distance:   Bilateral Distance:    Right Eye Near:   Left Eye Near:    Bilateral Near:     Physical Exam Constitutional:      General: She is not in acute distress.    Appearance: Normal appearance. She is not toxic-appearing or diaphoretic.  HENT:     Head: Normocephalic and atraumatic.     Right Ear: Tympanic membrane and ear canal normal.     Left Ear: Tympanic membrane and ear canal normal.     Nose: Congestion present.     Mouth/Throat:     Mouth: Mucous membranes are moist.     Pharynx: No posterior oropharyngeal erythema.  Eyes:     Extraocular Movements: Extraocular movements intact.     Conjunctiva/sclera: Conjunctivae normal.     Pupils: Pupils are equal, round, and reactive to light.  Cardiovascular:     Rate and Rhythm: Normal rate and regular rhythm.     Pulses: Normal pulses.     Heart sounds: Normal heart sounds.  Pulmonary:     Effort: Pulmonary effort is normal. No respiratory distress.     Breath sounds: No stridor. Wheezing present. No rhonchi or rales.     Comments: Mild wheezing noted on left lobe Abdominal:     General: Abdomen is flat. Bowel sounds are normal.     Palpations: Abdomen is soft.  Musculoskeletal:        General: Normal range of motion.     Cervical back: Normal range of motion.  Skin:    General: Skin is warm and dry.  Neurological:     General: No focal deficit present.     Mental  Status: She is alert and oriented to person, place, and time. Mental status is at baseline.  Psychiatric:        Mood and Affect: Mood normal.        Behavior: Behavior normal.      UC Treatments / Results  Labs (all labs ordered are listed, but only abnormal results are displayed) Labs Reviewed - No data to display  EKG   Radiology No results found.  Procedures Procedures (including critical care time)  Medications Ordered in UC Medications - No data to display  Initial Impression / Assessment and Plan / UC Course  I have reviewed the triage vital signs and the nursing notes.  Pertinent labs & imaging results that were available during my care of the patient were reviewed by me and considered in my medical decision making (see chart for details).     Patient has picture on her phone of hemoptysis that occurred last night.  It does appear to be a large amount of bright red blood which is very concerning for frank hemoptysis as opposed to just simply bloody sputum.  Given this and limited resources here in urgent care, patient was advised to go to the emergency department for further evaluation and management.  Patient was agreeable with plan.  Vital signs stable at discharge.  Agree with patient self transport to the hospital. Final Clinical Impressions(s) / UC Diagnoses   Final diagnoses:  Hemoptysis  Persistent cough for 3  weeks or longer     Discharge Instructions      Please go to the emergency department as soon as you leave urgent care for further evaluation and management given that you have been coughing up large amounts of blood.    ED Prescriptions   None    PDMP not reviewed this encounter.   Teodora Medici, [place] 01/16/22 [phone]

## 2022-01-16 NOTE — ED Triage Notes (Signed)
Pt c/o cough, sore throat,   Was recently tx w/ amoxicillin for sore throat from PCP.  Onset ~ 3 weeks ago

## 2022-01-16 NOTE — Discharge Instructions (Signed)
Please go to the emergency department as soon as you leave urgent care for further evaluation and management given that you have been coughing up large amounts of blood.

## 2022-01-16 NOTE — ED Notes (Signed)
Patient is being discharged from the Urgent Care and sent to the Emergency Department via self . Per Hildred Alamin, patient is in need of higher level of care due to cough. Patient is aware and verbalizes understanding of plan of care.  Vitals:   01/16/22 0908  BP: 125/83  Pulse: 100  Resp: 18  Temp: 98.9 F (37.2 C)  SpO2: 94%

## 2022-01-25 ENCOUNTER — Other Ambulatory Visit: Payer: Self-pay | Admitting: Physician Assistant

## 2022-01-25 DIAGNOSIS — D500 Iron deficiency anemia secondary to blood loss (chronic): Secondary | ICD-10-CM

## 2022-01-25 IMAGING — NM NM THYROID IMAGING W/ UPTAKE MULTI (4&24 HR)
4 series · 4 of 4 positions shown · non-contrast
Comparison: Thyroid ultrasound 03/25/2021

CLINICAL DATA: Hyperthyroidism

EXAM:
THYROID SCAN AND UPTAKE - 4 AND 24 HOURS
TECHNIQUE: Following oral administration of H-NMV capsule, anterior planar
imaging was acquired at 24 hours. Thyroid uptake was calculated with
a thyroid probe at 4-6 hours and 24 hours.
RADIOPHARMACEUTICALS:  436 uCi H-NMV sodium iodide p.o.

[Series 1: thyroid rao · 3.28mm/px · 1 of 1 slices shown]
[im 1/1]
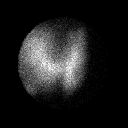

[Series 1: thyroid lao · 3.28mm/px · 1 of 1 slices shown]
[im 1/1]
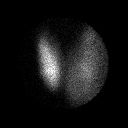

[Series 1: thyroid ant · 3.28mm/px · 1 of 1 slices shown]
[im 1/1]
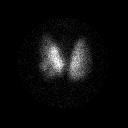

[Series 1: thyroid ant mark · 3.28mm/px · 1 of 1 slices shown]
[im 1/1]
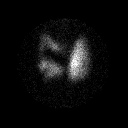

[4 of 4 positions shown; findings below may reference images not displayed]

FINDINGS: Homogeneous tracer distribution LEFT thyroid lobe.

Subtle areas of decreased tracer uptake at lateral aspect of mid and
inferior RIGHT thyroid lobe; these correspond to nodules identified
on ultrasound.

No additional areas of increased or decreased tracer uptake seen.

4 hour H-NMV uptake = 10.1% (normal 5-20%)

24 hour H-NMV uptake = 25.5% (normal 10-30%)
IMPRESSION: Normal 4 hour and 24 hour radio iodine uptakes.

Subtle cold nodules at the lateral aspect of the mid and inferior
RIGHT thyroid lobe, corresponding to nodules identified by thyroid
ultrasound; please see recommendations of recent thyroid ultrasound.

## 2022-01-25 IMAGING — CT CT CHEST LUNG CANCER SCREENING LOW DOSE W/O CM
1 series · 10 of 10 positions shown, 13 images · non-contrast
Comparison: None.

CLINICAL DATA: Eighty-three pack-year smoking history, current
smoker.



[ct lung segmentation data · axial · 0.79mm/px · z∈[-416,-416]mm · 10 of 303 frames shown]
[frame 1/303  mediastinal]
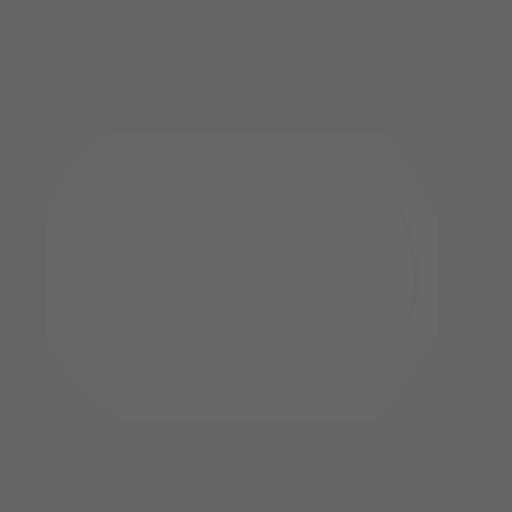
[frame 1/303  lung]
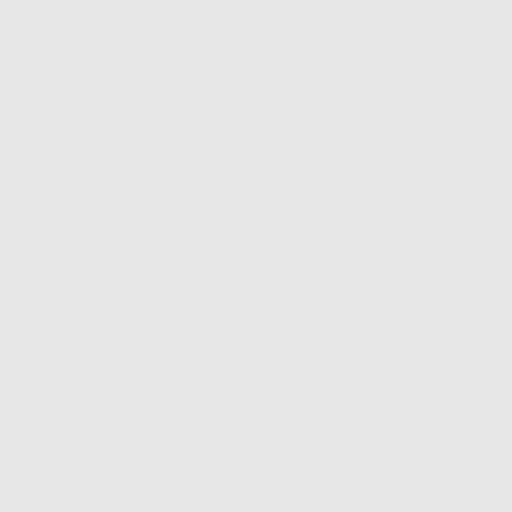
[frame 34/303  lung]
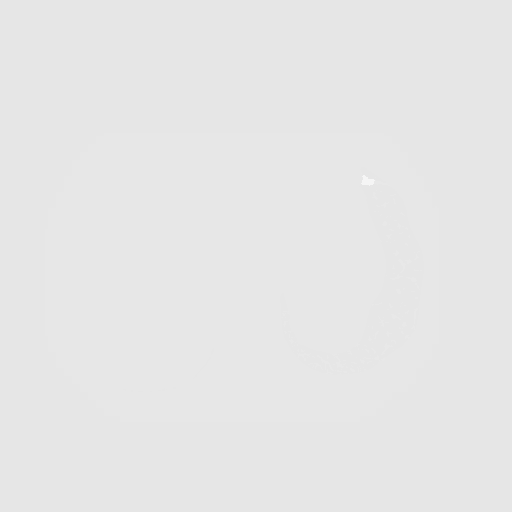
[frame 68/303  lung]
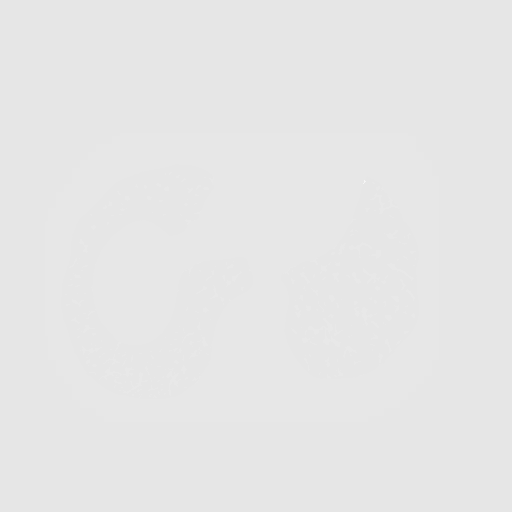
[frame 101/303  lung]
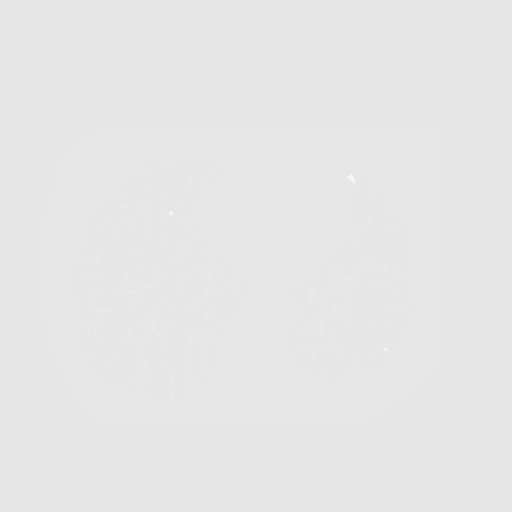
[frame 135/303  mediastinal]
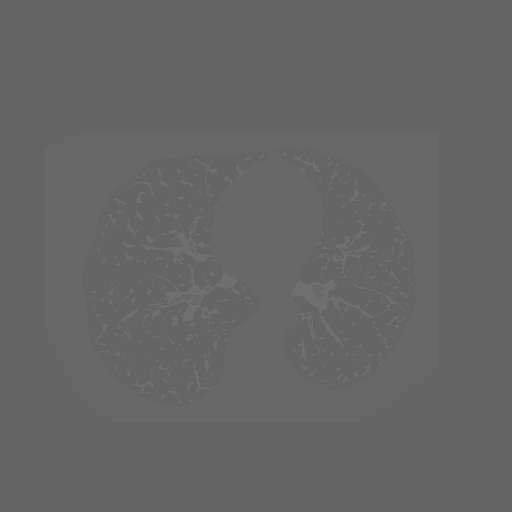
[frame 135/303  lung]
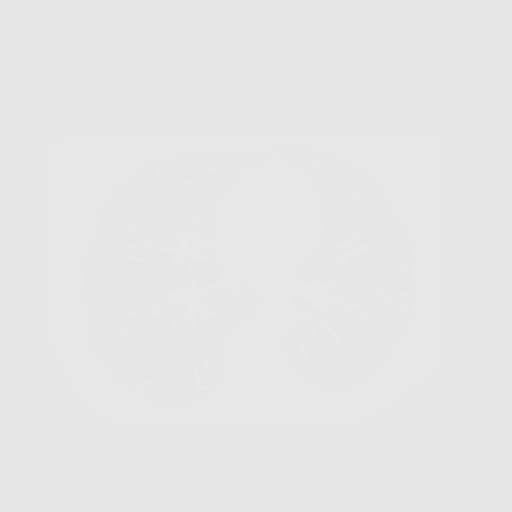
[frame 168/303  lung]
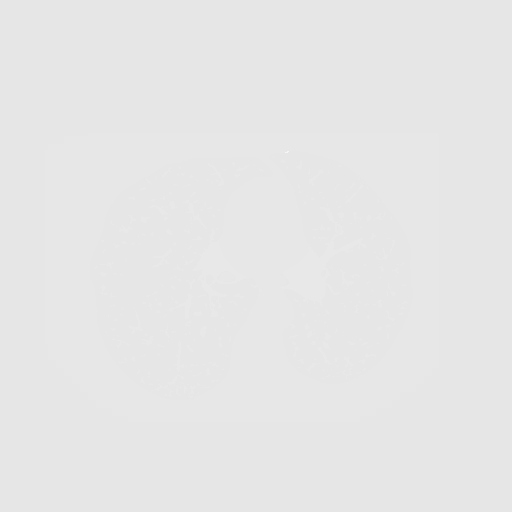
[frame 202/303  lung]
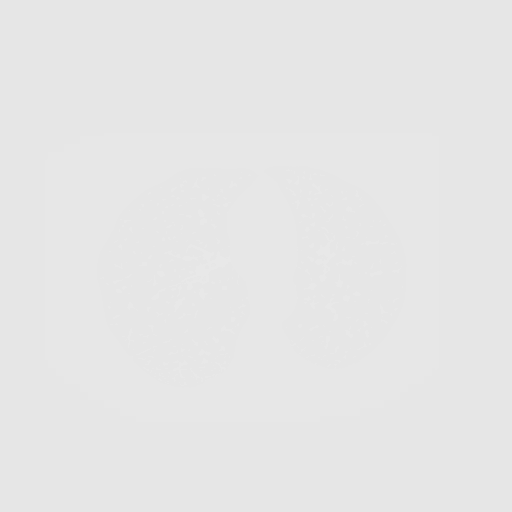
[frame 235/303  lung]
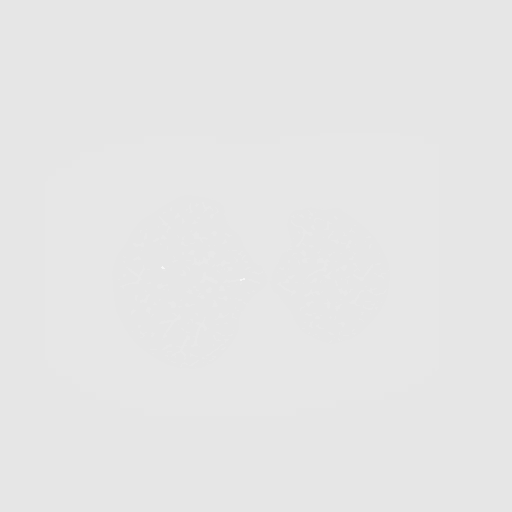
[frame 269/303  mediastinal]
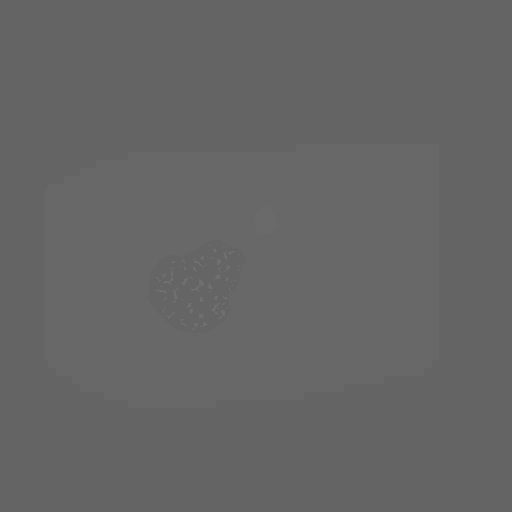
[frame 269/303  lung]
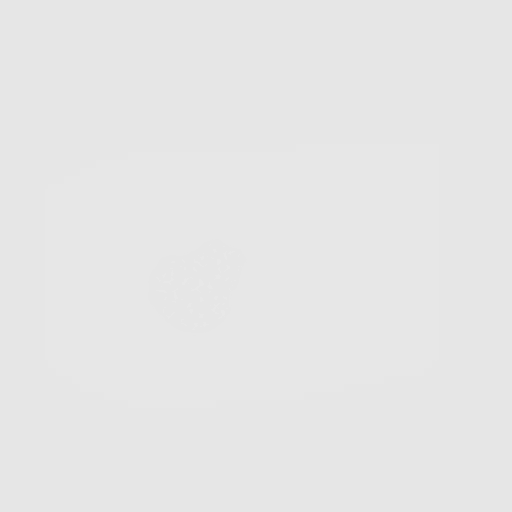
[frame 303/303  lung]
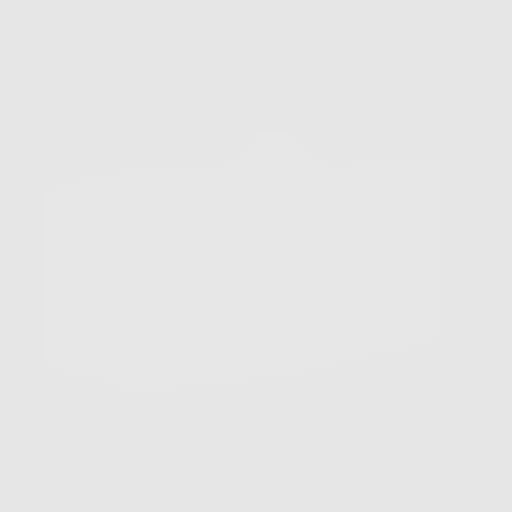

[10 of 10 positions shown; findings below may reference images not displayed]

FINDINGS: Cardiovascular: Atherosclerotic calcification of the aorta and
coronary arteries. Heart size normal. No pericardial effusion.

Mediastinum/Nodes: No pathologically enlarged mediastinal or
axillary lymph nodes. Hilar regions are difficult to definitively
evaluate without IV contrast. Esophagus is grossly unremarkable.

Lungs/Pleura: Biapical pleuroparenchymal scarring. Centrilobular and
paraseptal emphysema. Smoking related respiratory bronchiolitis.
mm lymph node along the left major fissure. No suspicious pulmonary
nodules. No pleural fluid. Airway is unremarkable.

Upper Abdomen: Visualized portions of the liver, adrenal glands,
kidneys, spleen, pancreas and stomach are grossly unremarkable.

Musculoskeletal: Degenerative changes in the spine. No worrisome
lytic or sclerotic lesions.
IMPRESSION: 1. Lung-RADS 2, benign appearance or behavior. Continue annual
screening with low-dose chest CT without contrast in 12 months.
2. Aortic atherosclerosis (ZF52K-2FD.D). Coronary artery
calcification.
3.  Emphysema (ZF52K-43O.O).

## 2022-01-26 ENCOUNTER — Inpatient Hospital Stay (HOSPITAL_BASED_OUTPATIENT_CLINIC_OR_DEPARTMENT_OTHER): Payer: Medicare HMO | Admitting: Physician Assistant

## 2022-01-26 ENCOUNTER — Other Ambulatory Visit: Payer: Self-pay

## 2022-01-26 ENCOUNTER — Inpatient Hospital Stay: Payer: Medicare HMO | Attending: Physician Assistant

## 2022-01-26 VITALS — BP 142/70 | HR 64 | Temp 98.0°F | Resp 17 | Ht 68.0 in | Wt 186.0 lb

## 2022-01-26 DIAGNOSIS — D500 Iron deficiency anemia secondary to blood loss (chronic): Secondary | ICD-10-CM

## 2022-01-26 DIAGNOSIS — K921 Melena: Secondary | ICD-10-CM | POA: Diagnosis present

## 2022-01-26 DIAGNOSIS — Z170 Estrogen receptor positive status [ER+]: Secondary | ICD-10-CM | POA: Insufficient documentation

## 2022-01-26 DIAGNOSIS — Z79811 Long term (current) use of aromatase inhibitors: Secondary | ICD-10-CM | POA: Insufficient documentation

## 2022-01-26 DIAGNOSIS — Z79899 Other long term (current) drug therapy: Secondary | ICD-10-CM | POA: Diagnosis not present

## 2022-01-26 DIAGNOSIS — F17210 Nicotine dependence, cigarettes, uncomplicated: Secondary | ICD-10-CM | POA: Insufficient documentation

## 2022-01-26 DIAGNOSIS — C50912 Malignant neoplasm of unspecified site of left female breast: Secondary | ICD-10-CM | POA: Diagnosis not present

## 2022-01-26 LAB — IRON AND IRON BINDING CAPACITY (CC-WL,HP ONLY)
Iron: 200 ug/dL — ABNORMAL HIGH (ref 28–170)
Saturation Ratios: 65 % — ABNORMAL HIGH (ref 10.4–31.8)
TIBC: 309 ug/dL (ref 250–450)
UIBC: 109 ug/dL — ABNORMAL LOW (ref 148–442)

## 2022-01-26 LAB — FERRITIN: Ferritin: 28 ng/mL (ref 11–307)

## 2022-01-26 LAB — CMP (CANCER CENTER ONLY)
ALT: 10 U/L (ref 0–44)
AST: 14 U/L — ABNORMAL LOW (ref 15–41)
Albumin: 4.1 g/dL (ref 3.5–5.0)
Alkaline Phosphatase: 85 U/L (ref 38–126)
Anion gap: 7 (ref 5–15)
BUN: 6 mg/dL — ABNORMAL LOW (ref 8–23)
CO2: 29 mmol/L (ref 22–32)
Calcium: 9.2 mg/dL (ref 8.9–10.3)
Chloride: 101 mmol/L (ref 98–111)
Creatinine: 0.61 mg/dL (ref 0.44–1.00)
GFR, Estimated: >60 mL/min (ref >60)
Glucose, Bld: 170 mg/dL — ABNORMAL HIGH (ref 70–99)
Potassium: 3.9 mmol/L (ref 3.5–5.1)
Sodium: 137 mmol/L (ref 135–145)
Total Bilirubin: 0.3 mg/dL (ref 0.3–1.2)
Total Protein: 6.9 g/dL (ref 6.5–8.1)

## 2022-01-26 LAB — CBC WITH DIFFERENTIAL (CANCER CENTER ONLY)
Abs Immature Granulocytes: 0.03 10*3/uL (ref 0.00–0.07)
Basophils Absolute: 0 10*3/uL (ref 0.0–0.1)
Basophils Relative: 0 %
Eosinophils Absolute: 0.2 10*3/uL (ref 0.0–0.5)
Eosinophils Relative: 4 %
HCT: 37.5 % (ref 36.0–46.0)
Hemoglobin: 12.8 g/dL (ref 12.0–15.0)
Immature Granulocytes: 1 %
Lymphocytes Relative: 23 %
Lymphs Abs: 1.5 10*3/uL (ref 0.7–4.0)
MCH: 31.1 pg (ref 26.0–34.0)
MCHC: 34.1 g/dL (ref 30.0–36.0)
MCV: 91 fL (ref 80.0–100.0)
Monocytes Absolute: 0.4 10*3/uL (ref 0.1–1.0)
Monocytes Relative: 7 %
Neutro Abs: 4.1 10*3/uL (ref 1.7–7.7)
Neutrophils Relative %: 65 %
Platelet Count: 195 10*3/uL (ref 150–400)
RBC: 4.12 MIL/uL (ref 3.87–5.11)
RDW: 14.3 % (ref 11.5–15.5)
WBC Count: 6.3 10*3/uL (ref 4.0–10.5)
nRBC: 0 % (ref 0.0–0.2)

## 2022-01-26 NOTE — Progress Notes (Signed)
[facility] [phone]   [phone]  PROGRESS NOTE  Patient Care Team: Barnetta Chapel, NP as PCP - General (Family Medicine) Thompson Grayer, MD as PCP - Cardiology (Cardiology)  Hematological/Oncological History -10/03/2021: Hgb 7.8. MCV 75.4 -10/23/2021: Hgb 8.6, MCV 75.1, Iron 25, Ferritin 15.6, Saturation 5.1%.   -11/18/2021: Establish care with [facility]ematology with Dr. Narda Rutherford and Dede Query PA-C  CHIEF COMPLAINTS/PURPOSE OF CONSULTATION:  Iron deficiency anemia  HISTORY OF PRESENTING ILLNESS:  Kellie Reynolds 66 y.o. female returns for a follow up for iron deficiency anemia. She is unaccompanied for this visit.   On exam today, Kellie Reynolds reports "nothing has changed." She reports continued blood in stool. She reports dark stools that darken to black throughout the day. loose stools, stool urgency at times, and the need to wears Pads due to blood and states it is common to see blood in the pad. She also reports multiple instances in the past few weeks where there was a multitude of blood in the toilet bowel. Additionally, she states she is currently getting over a cold, previously being seen for hemoptysis that lasted for about 2 hours. She has since been treated with two rounds of antibiotics. She states she is beginning to feel like she is getting better with occasional sensations of "bubbling." She denies fever, chills, weight loss. She reports long-standing night sweats that she attributes to sleeping with her grandchild. She denies improvement in her fatigue. She reports she is compliant with her blood thinner. She has no other complaints. Rest of the 10 point ROS is below.   MEDICAL HISTORY:  Past Medical History:  Diagnosis Date   Arthritis    right knee   Family history of adverse reaction to anesthesia 43 yrs ago   daughter had malignant hyperthermia reaction with acentine, daughter has had surgeries since then and did welll    Fibrocystic breast disease    GERD (gastroesophageal reflux disease)    HTN (hypertension)    Hypercholesteremia    IDA (iron deficiency anemia)    MVP (mitral valve prolapse)    Paroxysmal atrial fibrillation (HCC)    a. s/p PVI   Skin cancer    Sleep apnea    no longer use CPAP    SURGICAL HISTORY: Past Surgical History:  Procedure Laterality Date   ATRIAL FIBRILLATION ABLATION N/A 04/18/2012   PVI by Allred   BREAST LUMPECTOMY  2022   COLONOSCOPY WITH PROPOFOL N/A 11/24/2015   Procedure: COLONOSCOPY WITH PROPOFOL;  Surgeon: Garlan Fair, MD;  Location: [facility];  Service: Endoscopy;  Laterality: N/A;   EYE SURGERY Bilateral    lasix eye surgery both eyes   MENISECTOMY     Right knee   NASAL POLYP SURGERY  1990   NASAL SINUS SURGERY  1990   TEE WITHOUT CARDIOVERSION  04/17/2012   Procedure: TRANSESOPHAGEAL ECHOCARDIOGRAM (TEE);  Surgeon: Jettie Booze, MD;  Location: [facility];  Service: Cardiovascular;  Laterality: N/A;  pre ablation   TUBAL LIGATION      SOCIAL HISTORY: Social History   Socioeconomic History   Marital status: Divorced    Spouse name: Not on file   Number of children: 1   Years of education: Not on file   Highest education level: Not on file  Occupational History   Not on file  Tobacco Use   Smoking status: Every Day    Packs/day: 1.00    Years: 50.00    Total  pack years: 50.00    Types: Cigarettes   Smokeless tobacco: Never  Vaping Use   Vaping Use: Never used  Substance and Sexual Activity   Alcohol use: Yes    Comment: 4 beers oper day, regular sized beers   Drug use: No   Sexual activity: Not on file  Other Topics Concern   Not on file  Social History Narrative   Not on file   Social Determinants of Health   Financial Resource Strain: Not on file  Food Insecurity: Not on file  Transportation Needs: Not on file  Physical Activity: Not on file  Stress: Not on file  Social Connections: Not on file  Intimate  Partner Violence: Not on file    FAMILY HISTORY: Family History  Problem Relation Age of Onset   Diabetes Mother    Hyperlipidemia Father    Hypertension Father    CAD Father    Heart attack Father    Liver cancer Maternal Grandmother    Leukemia Maternal Grandfather    Colon cancer Neg Hx    Esophageal cancer Neg Hx    Stomach cancer Neg Hx    Rectal cancer Neg Hx     ALLERGIES:  is allergic to anastrozole and latex.  MEDICATIONS:  Current Outpatient Medications  Medication Sig Dispense Refill   acetaminophen (TYLENOL) 500 MG tablet Take 1,500 mg by mouth 3 (three) times daily as needed for headache.     alendronate (FOSAMAX) 70 MG tablet Take 70 mg by mouth once a week.     atorvastatin (LIPITOR) 20 MG tablet Take 20 mg by mouth daily.     Cholecalciferol (VITAMIN D3) 1.25 MG (50000 UT) CAPS Take 1 capsule by mouth once a week.     citalopram (CELEXA) 10 MG tablet Take 10 mg by mouth daily.     ferrous sulfate 325 (65 FE) MG tablet Take 325 mg by mouth daily.     flecainide (TAMBOCOR) 100 MG tablet Take 0.5 tablets (50 mg total) by mouth 2 (two) times daily. 90 tablet 3   guaiFENesin (MUCINEX) 600 MG 12 hr tablet Take 600 mg by mouth daily as needed (allergies).     letrozole (FEMARA) 2.5 MG tablet Take 2.5 mg by mouth daily.     losartan (COZAAR) 50 MG tablet Take 50 mg by mouth in the morning and at bedtime.     methimazole (TAPAZOLE) 5 MG tablet Take 0.5 tablets (2.5 mg total) by mouth daily. 45 tablet 3   metoprolol tartrate (LOPRESSOR) 50 MG tablet TAKE 0.5 TABLETS BY MOUTH 2 TIMES DAILY. (Patient taking differently: Take 25 mg by mouth 2 (two) times daily.) 90 tablet 3   omeprazole (PRILOSEC) 40 MG capsule TAKE 1 CAPSULE BY MOUTH 2 (TWO) TIMES DAILY 20-30 MINUTES BEFORE MEALS. (Patient taking differently: Take 40 mg by mouth in the morning and at bedtime.) 180 capsule 1   sodium chloride (OCEAN) 0.65 % nasal spray Place 1 spray into the nose 2 (two) times daily as needed.  For dryness     XARELTO 20 MG TABS tablet TAKE 1 TABLET BY MOUTH DAILY WITH SUPPER (Patient taking differently: Take 20 mg by mouth daily with supper.) 30 tablet 5   No current facility-administered medications for this visit.    REVIEW OF SYSTEMS:   Constitutional: ( - ) fevers, ( - )  chills , ( - ) night sweats Eyes: ( - ) blurriness of vision, ( - ) double vision, ( - ) watery  eyes Ears, nose, mouth, throat, and face: ( - ) mucositis, ( - ) sore throat Respiratory: ( - ) cough, ( + ) dyspnea, ( - ) wheezes Cardiovascular: ( - ) palpitation, ( - ) chest discomfort, ( - ) lower extremity swelling Gastrointestinal:  ( + ) nausea, ( - ) heartburn, ( + ) change in bowel habits Skin: ( - ) abnormal skin rashes Lymphatics: ( - ) new lymphadenopathy, ( - ) easy bruising Neurological: ( - ) numbness, ( - ) tingling, ( - ) new weaknesses Behavioral/Psych: ( - ) mood change, ( - ) new changes  All other systems were reviewed with the patient and are negative.  PHYSICAL EXAMINATION: ECOG PERFORMANCE STATUS: 1 - Symptomatic but completely ambulatory  Vitals:   01/26/22 1303  BP: (!) 142/70  Pulse: 64  Resp: 17  Temp: 98 F (36.7 C)  SpO2: 95%   Filed Weights   01/26/22 1303  Weight: 186 lb (84.4 kg)    GENERAL: well appearing female in NAD  SKIN: skin color, texture, turgor are normal, no rashes or significant lesions EYES: conjunctiva are pink and non-injected, sclera clear.  LUNGS: clear to auscultation and percussion with normal breathing effort HEART: regular rate & rhythm and no murmurs and no lower extremity edema Musculoskeletal: no cyanosis of digits and no clubbing  PSYCH: alert & oriented x 3, fluent speech NEURO: no focal motor/sensory deficits  LABORATORY DATA:  I have reviewed the data as listed    Latest Ref Rng & Units 01/26/2022   12:34 PM 11/18/2021   12:28 PM 10/23/2021    2:31 PM  CBC  WBC 4.0 - 10.5 K/uL 6.3  6.3  5.5   Hemoglobin 12.0 - 15.0 g/dL 12.8   11.6  8.6 Repeated and verified X2.   Hematocrit 36.0 - 46.0 % 37.5  36.7  27.5   Platelets 150 - 400 K/uL 195  200  242.0        Latest Ref Rng & Units 01/26/2022   12:34 PM 11/18/2021   12:28 PM 10/03/2021    9:48 AM  CMP  Glucose 70 - 99 mg/dL 170  112  185   BUN 8 - 23 mg/dL _0 Creatinine 0.44 - 1.00 mg/dL 0.61  0.63  0.75   Sodium 135 - 145 mmol/L 137  140  134   Potassium 3.5 - 5.1 mmol/L 3.9  4.0  3.4   Chloride 98 - 111 mmol/L 101  105  98   CO2 22 - 32 mmol/L _1 Calcium 8.9 - 10.3 mg/dL 9.2  9.5  8.5   Total Protein 6.5 - 8.1 g/dL 6.9  6.9  7.5   Total Bilirubin 0.3 - 1.2 mg/dL 0.3  0.3  1.0   Alkaline Phos 38 - 126 U/L 85  62  59   AST 15 - 41 U/L _2 ALT 0 - 44 U/L _3 RADIOGRAPHIC STUDIES: I have personally reviewed the radiological images as listed and agreed with the findings in the report. No results found.  ASSESSMENT & PLAN SHAUNDA TIPPING is a 66 y.o. returns for a follow up for iron deficiency anemia.  #Iron deficiency anemia 2/2 chronic blood loss: --Currently on ferrous sulfate 325 mg once a day with a source of vitamin C --Patient follows vegetarian diet so provided list of iron rich  foods to incorporate into diet.  --Under the care of Dr. Candis Schatz (GI) and underwent EGD/colonoscopy from 06/05/2021 showed no evidence of active bleeding.  --CT scan from 11/04/2021 showed mild focal wall thickening of the fourth portion of the duodenum.  --Underwent capsular EGD on 11/09/2021 that was incomplete due to capsule retention in the stomach. Small bowel enteroscopy was done on 12/07/2021 that showed no evidence of significant pathology in the entire duodenum/proximal jejunum. --Labs today show anemia has resolved with Hgb 12.8. Iron panel shows no evidence of deficiency.  --No need for IV iron at this time. --RTC in 3 months with repeat labs  #Hematochezia:  --We will request a follow up with GI to further evaluate.   #H/o  left breast cancer --Diagnosed in June 2022 and treated at [facility] lumpectomy on 07/29/2020. 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 --Not enough tissue for Oncotype testing.  --Received adjust radiation to the left breast 10/15/2020-11/11/2020.  --Started adjuvant endocrine therapy with anastrozole 1 mg daily on 11/24/2020. Plan for total of 7 years of therapy.  --Last mammogram was on 05/18/2021 that showed no evidence of malignancies. Next mammogram due in March 2024.  --Patient requested to transfer care to [facility] due to convenience of travel.   No orders of the defined types were placed in this encounter.   All questions were answered. The patient knows to call the clinic with any problems, questions or concerns.  I have spent a total of 25 minutes minutes of face-to-face and non-face-to-face time, preparing to see the patient, performing a medically appropriate examination, counseling and educating the patient,  communicating with other health care professionals,  and care coordination.   Dede Query, PA-C Department of Hematology/Oncology [place] [facility] Phone: [phone]

## 2022-01-27 ENCOUNTER — Telehealth: Payer: Self-pay | Admitting: Physician Assistant

## 2022-01-27 NOTE — Telephone Encounter (Signed)
Per 11/14 los called and spoke to pt about appointment

## 2022-01-28 ENCOUNTER — Telehealth: Payer: Self-pay

## 2022-01-28 NOTE — Telephone Encounter (Signed)
-----   Message from Lincoln Brigham, PA-C sent at 01/28/2022 10:00 AM EST ----- Please notify patient that iron levels have improved. No need for IV iron at this time.   ----- Message ----- From: Interface, Lab In [place] Sent: 01/26/2022  12:50 PM EST To: Lincoln Brigham, PA-C

## 2022-01-28 NOTE — Telephone Encounter (Signed)
Pt advised with VU 

## 2022-01-28 NOTE — Progress Notes (Signed)
Pt scheduled to see Dr. Candis Schatz 03/01/22 at 3:40pm. Appt letter mailed to pt.

## 2022-02-01 ENCOUNTER — Other Ambulatory Visit: Payer: Self-pay | Admitting: Gastroenterology

## 2022-02-01 ENCOUNTER — Other Ambulatory Visit: Payer: Self-pay | Admitting: Internal Medicine

## 2022-02-24 ENCOUNTER — Ambulatory Visit: Payer: Medicare HMO | Admitting: Physician Assistant

## 2022-02-24 ENCOUNTER — Other Ambulatory Visit: Payer: Medicare HMO

## 2022-03-01 ENCOUNTER — Encounter: Payer: Self-pay | Admitting: Gastroenterology

## 2022-03-01 ENCOUNTER — Other Ambulatory Visit (INDEPENDENT_AMBULATORY_CARE_PROVIDER_SITE_OTHER): Payer: Medicare HMO

## 2022-03-01 ENCOUNTER — Ambulatory Visit: Payer: Medicare HMO | Admitting: Gastroenterology

## 2022-03-01 VITALS — BP 150/90 | HR 72 | Ht 68.0 in | Wt 184.2 lb

## 2022-03-01 DIAGNOSIS — K219 Gastro-esophageal reflux disease without esophagitis: Secondary | ICD-10-CM

## 2022-03-01 DIAGNOSIS — R195 Other fecal abnormalities: Secondary | ICD-10-CM | POA: Diagnosis not present

## 2022-03-01 DIAGNOSIS — R918 Other nonspecific abnormal finding of lung field: Secondary | ICD-10-CM

## 2022-03-01 DIAGNOSIS — R197 Diarrhea, unspecified: Secondary | ICD-10-CM

## 2022-03-01 LAB — CBC WITH DIFFERENTIAL/PLATELET
Basophils Absolute: 0.1 10*3/uL (ref 0.0–0.1)
Basophils Relative: 1 % (ref 0.0–3.0)
Eosinophils Absolute: 0.1 10*3/uL (ref 0.0–0.7)
Eosinophils Relative: 1.3 % (ref 0.0–5.0)
HCT: 40.6 % (ref 36.0–46.0)
Hemoglobin: 13.8 g/dL (ref 12.0–15.0)
Lymphocytes Relative: 21.7 % (ref 12.0–46.0)
Lymphs Abs: 2.3 10*3/uL (ref 0.7–4.0)
MCHC: 33.9 g/dL (ref 30.0–36.0)
MCV: 92.6 fl (ref 78.0–100.0)
Monocytes Absolute: 0.7 10*3/uL (ref 0.1–1.0)
Monocytes Relative: 6.8 % (ref 3.0–12.0)
Neutro Abs: 7.2 10*3/uL (ref 1.4–7.7)
Neutrophils Relative %: 69.2 % (ref 43.0–77.0)
Platelets: 238 10*3/uL (ref 150.0–400.0)
RBC: 4.39 Mil/uL (ref 3.87–5.11)
RDW: 14.5 % (ref 11.5–15.5)
WBC: 10.5 10*3/uL (ref 4.0–10.5)

## 2022-03-01 MED ORDER — CHOLESTYRAMINE 4 G PO PACK: 4.0000 g | PACK | Freq: Every day | ORAL | 2 refills | Status: DC

## 2022-03-01 NOTE — Patient Instructions (Addendum)
_______________________________________________________  If you are age 65 or older, your body mass index should be between 23-30. Your Body mass index is 28.02 kg/m. If this is out of the aforementioned range listed, please consider follow up with your Primary Care Provider.  If you are age 58 or younger, your body mass index should be between 19-25. Your Body mass index is 28.02 kg/m. If this is out of the aformentioned range listed, please consider follow up with your Primary Care Provider.   Decrease Omeprazole to once daily.  We have sent the following medications to your pharmacy for you to pick up at your convenience: Cholestyramine 4g  Your provider has requested that you go to the basement level for lab work before leaving today. Press "B" on the elevator. The lab is located at the first door on the left as you exit the elevator.    The [facility] providers would like to encourage you to use [facility] to communicate with providers for non-urgent requests or questions.  Due to long hold times on the telephone, sending your provider a message by [facility] may be a faster and more efficient way to get a response.  Please allow 48 business hours for a response.  Please remember that this is for non-urgent requests.   Due to recent changes in healthcare laws, you may see the results of your imaging and laboratory studies on MyChart before your provider has had a chance to review them.  We understand that in some cases there may be results that are confusing or concerning to you. Not all laboratory results come back in the same time frame and the provider may be waiting for multiple results in order to interpret others.  Please give [place] 48 hours in order for your provider to thoroughly review all the results before contacting the office for clarification of your results.   It was a pleasure to see you today!  Thank you for trusting me with your gastrointestinal care!    Scott E.Candis Schatz, MD

## 2022-03-01 NOTE — Progress Notes (Unsigned)
HPI : Kellie Reynolds is a pleasant 66 year old female with a history of atrial fibrillation, hypertension and chronic tobacco use who I initially saw in clinic February 2023.  At that time she was having very significant GERD symptoms despite PPI, and had also been having recurrent episodes of painless hematochezia.  She underwent an EGD and a colonoscopy which showed a 3 to 4 cm hiatal hernia and suspected short segment Barrett's (Barrett's showed reflux changes only, no intestinal metaplasia) and gastric erythema.  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  In July 2023 she was experiencing abdominal pain and passage of dark stools.  She was found to have a drop in her hemoglobin down to 7.  She went to the emergency department in August, where a CT scan showed thickening of the duodenum.  A push enteroscopy was unremarkable.  A capsule endoscopy was attempted but was unsuccessful due to retention in the stomach during the entire recording time.  In the past few months, the patient reports continued dark stools.  However, she clarifies today that the stools are not black and tarry, but are dark green in color.  Her stools have been persistently loose for several months now.  She usually has at least 3 bowel movements per day.  She does not recall the last time she had a solid stool.  She has urgency with most bowel movements, and has had urge incontinence.  She denies nocturnal bowel movements.  She denies any abdominal pain, nausea or vomiting. Her appetite is good and her weight is stable. She sometimes sees that red blood on the toilet paper, but denies large-volume hematochezia or red blood in the stool or toilet water. Her GERD symptoms have been very well-controlled with twice daily omeprazole, which she has been taking since her upper endoscopy in February.  She is taking an iron tablet daily.  Her hemoglobin in November was 12.8.   Colonoscopy  2005 Tubular adenoma, sessile serrated polyp, hyperplastic polyp (pathology report available only, note procedure report)  Colonoscopy 2010 Rectal hyperplastic polyp (pathology report available only, note procedure report)  Colonoscopy September 2017 10 mm sessile serrated polyp in the ascending colon   EGD June 05, 2021 3 cm hiatal hernia/Hill grade 4 GEJ Suspected short segment Barrett's (biopsies neg for intestinal metaplasia) Gastric erythema, otherwise normal (biopsies with reactive gastropathy, H. 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 Irregular clustered irregular nodular opacities in the anterior left upper lobe, new from prior lung cancer screening CT. Favor infectious or inflammatory etiology. Given this is not well seen by radiograph, recommend chest CT in 3 months after course of treatment. 2. Equivocal mild focal wall thickening of the fourth portion of the duodenum, without adjacent fat stranding. This may be secondary to peptic ulcer disease, however consider correlation with endoscopy. 3. Colonic diverticulosis without diverticulitis. 4. Normal CT appearance of the urinary bladder. Physiologic bladder distension, no findings of urinary retention.    Push enteroscopy December 07, 2021 4 cm hiatal hernia Normal duodenum and jejunum, no evidence of enteritis  Past Medical History:  Diagnosis Date   Arthritis    right knee   Family  history of adverse reaction to anesthesia 35 yrs ago   daughter had malignant hyperthermia reaction with acentine, daughter has had surgeries since then and did welll   Fibrocystic breast  disease    GERD (gastroesophageal reflux disease)    HTN (hypertension)    Hypercholesteremia    IDA (iron deficiency anemia)    MVP (mitral valve prolapse)    Paroxysmal atrial fibrillation (HCC)    a. s/p PVI   Skin cancer    Sleep apnea    no longer use CPAP     Past Surgical History:  Procedure Laterality Date   ATRIAL FIBRILLATION ABLATION N/A 04/18/2012   PVI by Allred   BREAST LUMPECTOMY  2022   COLONOSCOPY WITH PROPOFOL N/A 11/24/2015   Procedure: COLONOSCOPY WITH PROPOFOL;  Surgeon: Garlan Fair, MD;  Location: [facility] ENDOSCOPY;  Service: Endoscopy;  Laterality: N/A;   EYE SURGERY Bilateral    lasix eye surgery both eyes   MENISECTOMY     Right knee   NASAL POLYP SURGERY  1990   NASAL SINUS SURGERY  1990   TEE WITHOUT CARDIOVERSION  04/17/2012   Procedure: TRANSESOPHAGEAL ECHOCARDIOGRAM (TEE);  Surgeon: Jettie Booze, MD;  Location: [facility] ENDOSCOPY;  Service: Cardiovascular;  Laterality: N/A;  pre ablation   TUBAL LIGATION     Family History  Problem Relation Age of Onset   Diabetes Mother    Hyperlipidemia Father    Hypertension Father    CAD Father    Heart attack Father    Liver cancer Maternal Grandmother    Leukemia Maternal Grandfather    Colon cancer Neg Hx    Esophageal cancer Neg Hx    Stomach cancer Neg Hx    Rectal cancer Neg Hx    Social History   Tobacco Use   Smoking status: Every Day    Packs/day: 1.00    Years: 50.00    Total pack years: 50.00    Types: Cigarettes   Smokeless tobacco: Never  Vaping Use   Vaping Use: Never used  Substance Use Topics   Alcohol use: Yes    Comment: 4 beers oper day, regular sized beers   Drug use: No   Current Outpatient Medications  Medication Sig Dispense Refill   acetaminophen (TYLENOL) 500 MG tablet Take 1,500 mg by mouth 3 (three) times daily as needed for headache.     alendronate (FOSAMAX) 70 MG tablet Take 70 mg by mouth once a week.     atorvastatin (LIPITOR) 20 MG tablet Take 20 mg  by mouth daily.     Cholecalciferol (VITAMIN D3) 1.25 MG (50000 UT) CAPS Take 1 capsule by mouth once a week.     citalopram (CELEXA) 10 MG tablet Take 10 mg by mouth daily.     ferrous sulfate 325 (65 FE) MG tablet Take 325 mg by mouth daily.     flecainide (TAMBOCOR) 100 MG tablet Take 0.5 tablets (50 mg total) by mouth 2 (two) times daily. 90 tablet 3   guaiFENesin (MUCINEX) 600 MG 12 hr tablet Take 600 mg by mouth daily as needed (allergies).     letrozole (FEMARA) 2.5 MG tablet Take 2.5 mg by mouth daily.     losartan (COZAAR) 50 MG tablet Take 50 mg by mouth in the morning and at bedtime.     methimazole (TAPAZOLE) 5 MG tablet Take 0.5 tablets (2.5 mg total) by mouth daily. 45 tablet 3   metoprolol tartrate (LOPRESSOR) 50 MG tablet TAKE 0.5 TABLETS  BY MOUTH 2 TIMES DAILY. (Patient taking differently: Take 25 mg by mouth 2 (two) times daily.) 90 tablet 3   omeprazole (PRILOSEC) 40 MG capsule TAKE 1 CAPSULE BY MOUTH 2 (TWO) TIMES DAILY 20-30 MINUTES BEFORE MEALS. 180 capsule 1   sodium chloride (OCEAN) 0.65 % nasal spray Place 1 spray into the nose 2 (two) times daily as needed. For dryness     XARELTO 20 MG TABS tablet TAKE 1 TABLET BY MOUTH DAILY WITH SUPPER (Patient taking differently: Take 20 mg by mouth daily with supper.) 30 tablet 5   No current facility-administered medications for this visit.   Allergies  Allergen Reactions   Anastrozole Other (See Comments)    unknown   Latex Itching, Rash and Other (See Comments)    Elastic in underwear      Review of Systems: All systems reviewed and negative except where noted in HPI.    No results found.  Physical Exam: BP (!) 150/90   Pulse 72   Ht '5\' 8"'$  (1.727 m)   Wt 184 lb 4 oz (83.6 kg)   BMI 28.02 kg/m  Constitutional: Pleasant,well-developed, Caucasian female in no acute distress. HEENT: Normocephalic and atraumatic. Conjunctivae are normal. No scleral icterus. Neck supple.  Cardiovascular: Normal rate, regular  rhythm.  Pulmonary/chest: Effort normal and breath sounds normal. Expiratory wheezes bilaterally Abdominal: Soft, nondistended, nontender. Bowel sounds active throughout. There are no masses palpable. No hepatomegaly. Extremities: no edema Neurological: Alert and oriented to person place and time. Skin: Skin is warm and dry. No rashes noted. Psychiatric: Normal mood and affect. Behavior is normal.  CBC    Component Value Date/Time   WBC 6.3 01/26/2022 1234   WBC 5.5 10/23/2021 1431   RBC 4.12 01/26/2022 1234   HGB 12.8 01/26/2022 1234   HCT 37.5 01/26/2022 1234   PLT 195 01/26/2022 1234   MCV 91.0 01/26/2022 1234   MCH 31.1 01/26/2022 1234   MCHC 34.1 01/26/2022 1234   RDW 14.3 01/26/2022 1234   LYMPHSABS 1.5 01/26/2022 1234   MONOABS 0.4 01/26/2022 1234   EOSABS 0.2 01/26/2022 1234   BASOSABS 0.0 01/26/2022 1234    CMP     Component Value Date/Time   NA 137 01/26/2022 1234   K 3.9 01/26/2022 1234   CL 101 01/26/2022 1234   CO2 29 01/26/2022 1234   GLUCOSE 170 (H) 01/26/2022 1234   BUN 6 (L) 01/26/2022 1234   CREATININE 0.61 01/26/2022 1234   CALCIUM 9.2 01/26/2022 1234   PROT 6.9 01/26/2022 1234   ALBUMIN 4.1 01/26/2022 1234   AST 14 (L) 01/26/2022 1234   ALT 10 01/26/2022 1234   ALKPHOS 85 01/26/2022 1234   BILITOT 0.3 01/26/2022 1234   GFRNONAA >60 01/26/2022 1234   GFRAA >60 10/07/2014 1600   Component Ref Range & Units 1 d ago (03/01/22) 1 mo ago (01/26/22) 3 mo ago (11/18/21) 4 mo ago (10/23/21) 5 mo ago (10/03/21) 7 yr ago (10/07/14) 7 yr ago (04/16/14)  WBC 4.0 - 10.5 K/uL 10.5 6.3 6.3 5.5 9.3 6.7 8.0  RBC 3.87 - 5.11 Mil/uL 4.39 4.12 R 4.41 R 3.67 Low  3.50 Low  R 4.26 R 4.40  Hemoglobin 12.0 - 15.0 g/dL 13.8 12.8 11.6 Low  8.6 Repeated and verified X2. Low  7.8 Low  13.5 13.2  HCT 36.0 - 46.0 % 40.6 37.5 36.7 27.5 Low  26.4 Low  39.2 38.9  MCV 78.0 - 100.0 fl 92.6 91.0 R 83.2 R 75.1 Low  75.4 Low  R 92.0 R 88.3  MCHC 30.0 - 36.0 g/dL 33.9 34.1 31.6 31.3  29.5 Low  34.4 34.0  RDW 11.5 - 15.5 % 14.5 14.3 23.7 High  26.4 High  16.2 High  13.3 13.6  Platelets 150.0 - 400.0 K/uL 238.0 195 R 200 R 242.0 260 R 144 Low  R    Component Ref Range & Units 1 mo ago 3 mo ago 4 mo ago  Ferritin 11 - 307 ng/mL 28 23 CM 15.6 R   ASSESSMENT AND PLAN: 66 year old female with history of occult GI bleed and iron deficiency anemia.  CT scan in August showed inflammatory changes in the fourth portion of the duodenum, but subsequent push enteroscopy was unremarkable.  She had reported previously that she was seeing frequent black stools since then, but today she clarified that the stools are actually more dark green in color.  She does see occasional bright red blood per rectum as well, but this is scant and just on the toilet paper.  Her hemoglobin has steadily improved since the bleeding episode in July. 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  It is possible this is the source of bleeding back in July.  Capsule endoscopy was unsuccessful due to retention in the stomach.  Low suspicion for ongoing bleeding based on her description of stools.  We will repeat hemoglobin today.  If it has dropped significantly, then we can consider repeating a colonoscopy in the hospital and ablating the AVM.  If hemoglobin stable, will continue to monitor for evidence of bleeding empirically check CBC. The patient is bothered by chronic loose stools and urgency without abdominal pain.  Will check fecal elastase to assess for pancreatic insufficiency given her long tobacco history.  Recommended she try cholestyramine once daily for a few weeks to see if this helps with her diarrhea and urgency.  She was instructed to take this 1 hour after her morning medications. Her GERD symptoms have been very well-controlled since starting the omeprazole.  She is still taking it twice a day.  I recommend she decrease her dose to once daily to see if her symptoms are still adequately controlled. Her CT  scan in August showed irregular nodular opacities in the anterior left lobe, likely infectious or inflammatory.  Repeat CT was recommended in 3 months.  Patient was not aware of this and does not know if her PCP is planning on ordering this repeat CT scan.  Currently, the patient is recovering from an RSV infection and has active wheezing on exam.  I recommended that she wait another few weeks until she has recovered from this infection and have her PCP order a repeat CT scan.  If she is having difficulty getting this ordered, I told her I would order it for her.  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 Candis Schatz, MD [facility]   CC:  Barnetta Chapel, NP

## 2022-03-02 DIAGNOSIS — R918 Other nonspecific abnormal finding of lung field: Secondary | ICD-10-CM | POA: Insufficient documentation

## 2022-03-03 ENCOUNTER — Other Ambulatory Visit: Payer: Medicare HMO

## 2022-03-03 DIAGNOSIS — R197 Diarrhea, unspecified: Secondary | ICD-10-CM

## 2022-03-03 NOTE — Progress Notes (Signed)
Kellie Reynolds,  Your blood counts look great.  Your hemoglobin is back to her baseline.  Please let us know if you think you are seeing blood in the stool again.  I am hopeful that the cholestyramine may help with your diarrhea and urgency.  Please follow-up with [place] in the clinic in the next 1 to 2 months if you are still having issues with diarrhea.

## 2022-03-12 LAB — PANCREATIC ELASTASE, FECAL: Pancreatic Elastase-1, Stool: 180 mcg/g — ABNORMAL LOW

## 2022-03-19 ENCOUNTER — Other Ambulatory Visit: Payer: Self-pay

## 2022-03-19 MED ORDER — PANCRELIPASE (LIP-PROT-AMYL) 36000-114000 UNITS PO CPEP: ORAL_CAPSULE | ORAL | 3 refills | Status: DC

## 2022-03-19 NOTE — Progress Notes (Signed)
Kellie Reynolds,  Your fecal elastase level was slightly low.  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  This is typically treated with capsules containing the enzymes that your pancreas normally makes.  They are to be taken with meals and snacks.    Linda,  Please prescribed Creon, 2 caps PO with each meal and 1 cap PO with each snack #[id number]; if her formulary has a preferred EPI medication, ok to substitute.

## 2022-03-26 ENCOUNTER — Encounter: Payer: Self-pay | Admitting: Internal Medicine

## 2022-03-26 ENCOUNTER — Ambulatory Visit: Payer: Medicare HMO | Admitting: Internal Medicine

## 2022-03-26 VITALS — BP 120/82 | HR 55 | Ht 68.0 in | Wt 180.0 lb

## 2022-03-26 DIAGNOSIS — E0590 Thyrotoxicosis, unspecified without thyrotoxic crisis or storm: Secondary | ICD-10-CM | POA: Insufficient documentation

## 2022-03-26 DIAGNOSIS — E042 Nontoxic multinodular goiter: Secondary | ICD-10-CM

## 2022-03-26 LAB — TSH: TSH: 1.1 u[IU]/mL (ref 0.35–5.50)

## 2022-03-26 LAB — T4, FREE: Free T4: 0.7 ng/dL (ref 0.60–1.60)

## 2022-03-26 NOTE — Progress Notes (Unsigned)
Name: Kellie Reynolds  MRN/ DOB: [id number], Oct 12, 1955    Age/ Sex: 67 y.o., female    PCP: Barnetta Chapel, NP   Reason for Endocrinology Evaluation: MNG     Date of Initial Endocrinology Evaluation: 06/18/2021    HPI: Kellie Reynolds is a 67 y.o. female with a past medical history of MNG, A.Fib, osteoporosis and DM, Hx of breast Ca ( Left lumpectomy . The patient presented for initial endocrinology clinic visit on 06/18/2021 for consultative assistance with her Subclinical Hyperthyroidism and MNG.   Patient states that she has had diarrhea since 2020 which was started following radiation treatment for left breast cancer.  During evaluation for causes of diarrhea the patient has been noted to have multinodular goiter   No FH of thyroid disease    In review of her chart she had a Hx of thyroid uptake and scan in 2008 with a 24-hr uptake at 12% . Thyroid uptake and scan 03/2021 showed subtle areas of decrease tracer uptake at the lateral aspect of mid and inferior right love.    On her initial visit to our clinic she had an TSH 0.31 uIU/mL with normal T4 and T3.  We started methimazole 2.5 mg daily (06/2021)  No prior use of Amiodarone   SUBJECTIVE:    Today (.toda):  Kellie Reynolds is here for follow-up of multinodular goiter and hyperthyroidism   Weight stable  Denies local neck swelling  Has occasional  chronic palpitations, A. Fib stable ( S/P cardioversion ) Anxiety stable Continues with chronic diarrhea , and dark stools, last GI f/u on 03/03/2022 Denies tremors   No biotin intake   Methimazole 5 mg, half a tablet daily     HISTORY:  Past Medical History:  Past Medical History:  Diagnosis Date   Arthritis    right knee   Family history of adverse reaction to anesthesia 22 yrs ago   daughter had malignant hyperthermia reaction with acentine, daughter has had surgeries since then and did welll   Fibrocystic breast disease    GERD (gastroesophageal  reflux disease)    HTN (hypertension)    Hypercholesteremia    IDA (iron deficiency anemia)    MVP (mitral valve prolapse)    Paroxysmal atrial fibrillation (HCC)    a. s/p PVI   Skin cancer    Sleep apnea    no longer use CPAP   Past Surgical History:  Past Surgical History:  Procedure Laterality Date   ATRIAL FIBRILLATION ABLATION N/A 04/18/2012   PVI by Allred   BREAST LUMPECTOMY  2022   COLONOSCOPY WITH PROPOFOL N/A 11/24/2015   Procedure: COLONOSCOPY WITH PROPOFOL;  Surgeon: Garlan Fair, MD;  Location: [facility];  Service: Endoscopy;  Laterality: N/A;   EYE SURGERY Bilateral    lasix eye surgery both eyes   MENISECTOMY     Right knee   NASAL POLYP SURGERY  1990   NASAL SINUS SURGERY  1990   TEE WITHOUT CARDIOVERSION  04/17/2012   Procedure: TRANSESOPHAGEAL ECHOCARDIOGRAM (TEE);  Surgeon: Jettie Booze, MD;  Location: [facility];  Service: Cardiovascular;  Laterality: N/A;  pre ablation   TUBAL LIGATION      Social History:  reports that she has been smoking cigarettes. She has a 50.00 pack-year smoking history. She has never used smokeless tobacco. She reports current alcohol use. She reports that she does not use drugs. Family History: family history includes CAD in her father; Diabetes in her  mother; Heart attack in her father; Hyperlipidemia in her father; Hypertension in her father; Leukemia in her maternal grandfather; Liver cancer in her maternal grandmother.   HOME MEDICATIONS: Allergies as of 03/26/2022       Reactions   Anastrozole Other (See Comments)   unknown   Latex Itching, Rash, Other (See Comments)   Elastic in underwear        Medication List        Accurate as of March 26, 2022  9:55 AM. If you have any questions, ask your nurse or doctor.          acetaminophen 500 MG tablet Commonly known as: TYLENOL Take 1,500 mg by mouth 3 (three) times daily as needed for headache.   alendronate 70 MG tablet Commonly known as:  FOSAMAX Take 70 mg by mouth once a week.   atorvastatin 20 MG tablet Commonly known as: LIPITOR Take 20 mg by mouth daily.   cholestyramine 4 g packet Commonly known as: Questran Take 1 packet (4 g total) by mouth daily. Take 4g daily with breakfast.   citalopram 10 MG tablet Commonly known as: CELEXA Take 10 mg by mouth daily.   Coenzyme Q10 200 MG capsule Take 200 mg by mouth daily.   ferrous sulfate 325 (65 FE) MG tablet Take 325 mg by mouth daily.   flecainide 100 MG tablet Commonly known as: TAMBOCOR Take 0.5 tablets (50 mg total) by mouth 2 (two) times daily.   guaiFENesin 600 MG 12 hr tablet Commonly known as: MUCINEX Take 600 mg by mouth daily as needed (allergies).   letrozole 2.5 MG tablet Commonly known as: FEMARA Take 2.5 mg by mouth daily.   lipase/protease/amylase 36000 UNITS Cpep capsule Commonly known as: CREON Take 2 caps by mouth with each meal and 1 with snack   losartan 50 MG tablet Commonly known as: COZAAR Take 50 mg by mouth in the morning and at bedtime.   methimazole 5 MG tablet Commonly known as: TAPAZOLE Take 0.5 tablets (2.5 mg total) by mouth daily.   metoprolol tartrate 50 MG tablet Commonly known as: LOPRESSOR TAKE 0.5 TABLETS BY MOUTH 2 TIMES DAILY. What changed: See the new instructions.   omeprazole 40 MG capsule Commonly known as: PRILOSEC TAKE 1 CAPSULE BY MOUTH 2 (TWO) TIMES DAILY 20-30 MINUTES BEFORE MEALS.   sodium chloride 0.65 % nasal spray Commonly known as: OCEAN Place 1 spray into the nose 2 (two) times daily as needed. For dryness   Vitamin D3 1.25 MG (50000 UT) Caps Take 1 capsule by mouth once a week.   Xarelto 20 MG Tabs tablet Generic drug: rivaroxaban TAKE 1 TABLET BY MOUTH DAILY WITH SUPPER What changed: how much to take          REVIEW OF SYSTEMS: A comprehensive ROS was conducted with the patient and is negative except as per HPI   OBJECTIVE:  VS: BP 120/82 (BP Location: Left Arm, Patient  Position: Sitting, Cuff Size: Small)   Pulse (!) 55   Ht '5\' 8"'$  (1.727 m)   Wt 180 lb (81.6 kg)   SpO2 95%   BMI 27.37 kg/m    Wt Readings from Last 3 Encounters:  03/26/22 180 lb (81.6 kg)  03/01/22 184 lb 4 oz (83.6 kg)  01/26/22 186 lb (84.4 kg)     EXAM: General: Pt appears well and is in NAD  Neck: General: Supple without adenopathy. Thyroid: Thyroid size normal.  No goiter or nodules appreciated.  Lungs: Clear with good BS bilat with no rales, rhonchi, or wheezes  Heart: Auscultation: RRR.  Abdomen: Normoactive bowel sounds, soft, nontender, without masses or organomegaly palpable  Extremities:  BL LE: No pretibial edema normal ROM and strength.  Mental Status: Judgment, insight: Intact Orientation: Oriented to time, place, and person Mood and affect: No depression, anxiety, or agitation     DATA REVIEWED: ****    Latest Reference Range & Units 06/18/21 12:22  TSH 0.35 - 5.50 uIU/mL 0.31 (L)  Triiodothyronine (T3) 76 - 181 ng/dL 97  T4,Free(Direct) 0.60 - 1.60 ng/dL 0.76     Thyroid ultrasound 03/25/2021 Estimated total number of nodules >/= 1 cm: 3   Number of spongiform nodules >/=  2 cm not described below (TR1): 0   Number of mixed cystic and solid nodules >/= 1.5 cm not described below ([place]): 0   _________________________________________________________   Nodule # 1:   Location: Right; Mid   Maximum size: 1.1 cm; Other 2 dimensions: 0.8 x 1.1 cm   Composition: solid/almost completely solid (2)   Echogenicity: hypoechoic (2)   Shape: not taller-than-wide (0)   Margins: smooth (0)   Echogenic foci: macrocalcifications (1)   ACR TI-RADS total points: 5.   ACR TI-RADS risk category: TR4 (4-6 points).   ACR TI-RADS recommendations:   *Given size (>/= 1 - 1.4 cm) and appearance, a follow-up ultrasound in 1 year should be considered based on TI-RADS criteria.   _________________________________________________________   Nodule # 2:    Location: Right; Mid   Maximum size: 1.9 cm; Other 2 dimensions: 0.9 x 1.4 cm   Composition: mixed cystic and solid (1)   Echogenicity: isoechoic (1)   Shape: not taller-than-wide (0)   Margins: ill-defined (0)   Echogenic foci: none (0)   ACR TI-RADS total points: 2.   ACR TI-RADS risk category: TR2 (2 points).   ACR TI-RADS recommendations:   This nodule does NOT meet TI-RADS criteria for biopsy or dedicated follow-up.   _________________________________________________________   Nodule # 3:   Location: Right; Inferior   Maximum size: 1.2 cm; Other 2 dimensions: 1.1 x 0.7 cm   Composition: spongiform (0)   Echogenicity: isoechoic (1)   Shape: not taller-than-wide (0)   Margins: smooth (0)   Echogenic foci: none (0)   ACR TI-RADS total points: 1.   ACR TI-RADS risk category: TR1 (0-1 points).   ACR TI-RADS recommendations:   This nodule does NOT meet TI-RADS criteria for biopsy or dedicated follow-up.   _________________________________________________________   Tiny punctate cystic nodules in the left thyroid incidentally noted. No hypervascularity. No regional adenopathy.   IMPRESSION: 1.1 cm right mid thyroid TR 4 nodule meets criteria for follow-up in 1 year. Currently no biopsy indicated.    Thyroid Uptake 03/26/2021    FINDINGS: Homogeneous tracer distribution LEFT thyroid lobe.   Subtle areas of decreased tracer uptake at lateral aspect of mid and inferior RIGHT thyroid lobe; these correspond to nodules identified on ultrasound.   No additional areas of increased or decreased tracer uptake seen.   4 hour I-123 uptake = 10.1% (normal 5-20%)   24 hour I-123 uptake = 25.5% (normal 10-30%)   IMPRESSION: Normal 4 hour and 24 hour radio iodine uptakes.   Subtle cold nodules at the lateral aspect of the mid and inferior RIGHT thyroid lobe, corresponding to nodules identified by thyroid ultrasound; please see recommendations of  recent thyroid ultrasound.     ASSESSMENT/PLAN/RECOMMENDATIONS:   MNG  -No local neck  symptoms -Thyroid uptake and scan shows subtle cold nodules at the lateral aspect of the mid and the inferior right thyroid lobe but based on thyroid ultrasound none of these nodules met criteria for FNA  -Will repeat thyroid ultrasound for this year   2. Subclinical Hyperthyroidism :   -Patient is clinically euthyroid -We have opted to treat with methimazole given personal history of atrial fibrillation and the importance of keeping thyroid levels at normal level to prevent any cardiac arrhythmias -TFTs ***   Medication Continue methimazole 5 mg, half a tablet daily     F/U in 6 months    Signed electronically by: Mack Guise, MD  [facility]  [facility] [address] [place], [place] [address] Phone: [phone] FAX: [phone]   CC: Barnetta Chapel, NP [place] [address] Phone: [phone] Fax: [phone]   Return to Endocrinology clinic as below: Future Appointments  Date Time Provider [place]  04/30/2022 10:00 AM [facility] None  04/30/2022 10:30 AM Lincoln Brigham, PA-C [facility] None

## 2022-03-28 LAB — TRAB (TSH RECEPTOR BINDING ANTIBODY): TRAB: 1.15 IU/L (ref <=2.00)

## 2022-03-29 MED ORDER — METHIMAZOLE 5 MG PO TABS: 2.5000 mg | ORAL_TABLET | Freq: Every day | ORAL | 3 refills | Status: DC

## 2022-03-30 ENCOUNTER — Other Ambulatory Visit: Payer: Self-pay | Admitting: Internal Medicine

## 2022-03-30 NOTE — Telephone Encounter (Signed)
Prescription refill request for Xarelto received.  Indication:afib Last office visit:needs ov Weight:81.6  kg Age:67 Scr:0.6 CrCl:120.42  ml/min  Prescription refilled

## 2022-04-01 ENCOUNTER — Other Ambulatory Visit (HOSPITAL_COMMUNITY): Payer: Self-pay | Admitting: Family

## 2022-04-01 DIAGNOSIS — Z87891 Personal history of nicotine dependence: Secondary | ICD-10-CM

## 2022-04-05 ENCOUNTER — Ambulatory Visit
Admission: RE | Admit: 2022-04-05 | Discharge: 2022-04-05 | Disposition: A | Discharge status: Home / Self Care | Payer: Medicare HMO | Source: Ambulatory Visit | Attending: Internal Medicine | Admitting: Internal Medicine

## 2022-04-05 DIAGNOSIS — E042 Nontoxic multinodular goiter: Secondary | ICD-10-CM

## 2022-04-12 ENCOUNTER — Ambulatory Visit (HOSPITAL_COMMUNITY)
Admission: RE | Admit: 2022-04-12 | Discharge: 2022-04-12 | Disposition: A | Discharge status: Home / Self Care | Payer: Medicare HMO | Source: Ambulatory Visit | Attending: Family | Admitting: Family

## 2022-04-12 ENCOUNTER — Encounter (HOSPITAL_COMMUNITY): Payer: Self-pay

## 2022-04-12 DIAGNOSIS — Z87891 Personal history of nicotine dependence: Secondary | ICD-10-CM | POA: Diagnosis not present

## 2022-04-13 ENCOUNTER — Encounter (HOSPITAL_COMMUNITY): Payer: Self-pay | Admitting: Nurse Practitioner

## 2022-04-13 ENCOUNTER — Ambulatory Visit (HOSPITAL_COMMUNITY)
Admission: RE | Admit: 2022-04-13 | Discharge: 2022-04-13 | Disposition: A | Discharge status: Home / Self Care | Payer: Medicare HMO | Source: Ambulatory Visit | Attending: Nurse Practitioner | Admitting: Nurse Practitioner

## 2022-04-13 VITALS — BP 132/74 | HR 58 | Ht 68.0 in | Wt 179.6 lb

## 2022-04-13 DIAGNOSIS — D6869 Other thrombophilia: Secondary | ICD-10-CM

## 2022-04-13 DIAGNOSIS — R7303 Prediabetes: Secondary | ICD-10-CM | POA: Insufficient documentation

## 2022-04-13 DIAGNOSIS — I480 Paroxysmal atrial fibrillation: Secondary | ICD-10-CM | POA: Diagnosis present

## 2022-04-13 DIAGNOSIS — I10 Essential (primary) hypertension: Secondary | ICD-10-CM | POA: Insufficient documentation

## 2022-04-13 DIAGNOSIS — F17210 Nicotine dependence, cigarettes, uncomplicated: Secondary | ICD-10-CM | POA: Insufficient documentation

## 2022-04-13 DIAGNOSIS — Z7901 Long term (current) use of anticoagulants: Secondary | ICD-10-CM | POA: Diagnosis not present

## 2022-04-13 DIAGNOSIS — Z79899 Other long term (current) drug therapy: Secondary | ICD-10-CM | POA: Diagnosis not present

## 2022-04-13 NOTE — Progress Notes (Addendum)
Patient ID: Kellie Reynolds, female   DOB: February 17, 1956, 67 y.o.   MRN: [id number]     Primary Care Physician: Elisha Ponder, NP Referring Physician: Thompson Grayer, MD f/u   Kellie Reynolds is a 67 y.o. female with a h/o PAF on flecainide for f/u in the afib clinic, having seen Dr. Rayann Heman 1 year ago.  She reports that her afib burden is low and she is happy with the current afib management. She continues on xarelto  without bleeding issues. Blood pressure rechecked at 146/80. H/o afib ablation 04/18/12.It sounds she may be experiencing  a PC from time to time, no sustained heart irregularity. She is not experiencing any chest pain  or shortness of breath, but she is requesting a cardiac calcium score for her risk factors of tobacco use , HTN, age and prediabetes.   Today, she denies symptoms of palpitations, chest pain, shortness of breath, orthopnea, PND, lower extremity edema, dizziness, presyncope, syncope, or neurologic sequela. The patient is tolerating medications without difficulties and is otherwise without complaint today.   Past Medical History:  Diagnosis Date   Arthritis    right knee   Breast CA ([place]) 2022   left   Family history of adverse reaction to anesthesia 77 yrs ago   daughter had malignant hyperthermia reaction with acentine, daughter has had surgeries since then and did welll   Fibrocystic breast disease    GERD (gastroesophageal reflux disease)    HTN (hypertension)    Hypercholesteremia    IDA (iron deficiency anemia)    MVP (mitral valve prolapse)    Paroxysmal atrial fibrillation (HCC)    a. s/p PVI   Skin cancer    Sleep apnea    no longer use CPAP   Past Surgical History:  Procedure Laterality Date   ATRIAL FIBRILLATION ABLATION N/A 04/18/2012   PVI by Allred   BREAST LUMPECTOMY  2022   COLONOSCOPY WITH PROPOFOL N/A 11/24/2015   Procedure: COLONOSCOPY WITH PROPOFOL;  Surgeon: Garlan Fair, MD;  Location: [facility];  Service: Endoscopy;   Laterality: N/A;   EYE SURGERY Bilateral    lasix eye surgery both eyes   MENISECTOMY     Right knee   NASAL POLYP SURGERY  1990   NASAL SINUS SURGERY  1990   TEE WITHOUT CARDIOVERSION  04/17/2012   Procedure: TRANSESOPHAGEAL ECHOCARDIOGRAM (TEE);  Surgeon: Jettie Booze, MD;  [facility];  Service: Cardiovascular;  Laterality: N/A;  pre ablation   TUBAL LIGATION      Current Outpatient Medications  Medication Sig Dispense Refill   acetaminophen (TYLENOL) 500 MG tablet Take 1,500 mg by mouth 3 (three) times daily as needed for headache.     alendronate (FOSAMAX) 70 MG tablet Take 70 mg by mouth once a week.     atorvastatin (LIPITOR) 20 MG tablet Take 20 mg by mouth daily.     Cholecalciferol (VITAMIN D3) 1.25 MG (50000 UT) CAPS Take 1 capsule by mouth once a week.     citalopram (CELEXA) 10 MG tablet Take 10 mg by mouth daily.     Coenzyme Q10 200 MG capsule Take 200 mg by mouth daily.     ferrous sulfate 325 (65 FE) MG tablet Take 325 mg by mouth daily.     flecainide (TAMBOCOR) 100 MG tablet Take 0.5 tablets (50 mg total) by mouth 2 (two) times daily. 90 tablet 3   guaiFENesin (MUCINEX) 600 MG 12 hr tablet Take 600 mg by mouth  daily as needed (allergies).     letrozole (FEMARA) 2.5 MG tablet Take 2.5 mg by mouth daily.     losartan (COZAAR) 50 MG tablet Take 50 mg by mouth in the morning and at bedtime.     methimazole (TAPAZOLE) 5 MG tablet Take 0.5 tablets (2.5 mg total) by mouth daily. 45 tablet 3   metoprolol tartrate (LOPRESSOR) 50 MG tablet TAKE 0.5 TABLETS BY MOUTH 2 TIMES DAILY. (Patient taking differently: Take 25 mg by mouth 2 (two) times daily.) 90 tablet 3   omeprazole (PRILOSEC) 40 MG capsule TAKE 1 CAPSULE BY MOUTH 2 (TWO) TIMES DAILY 20-30 MINUTES BEFORE MEALS. 180 capsule 1   rivaroxaban (XARELTO) 20 MG TABS tablet Take 1 tablet (20 mg total) by mouth daily with supper. NEEDS CARDIOLOGY APPOINTMENT, CALL OFFICE 30 tablet 0   sodium chloride (OCEAN)  0.65 % nasal spray Place 1 spray into the nose 2 (two) times daily as needed. For dryness     No current facility-administered medications for this encounter.    Allergies  Allergen Reactions   Anastrozole Other (See Comments)    unknown   Latex Itching, Rash and Other (See Comments)    Elastic in underwear     Social History   Socioeconomic History   Marital status: Divorced    Spouse name: Not on file   Number of children: 1   Years of education: Not on file   Highest education level: Not on file  Occupational History   Not on file  Tobacco Use   Smoking status: Every Day    Packs/day: 1.00    Years: 50.00    Total pack years: 50.00    Types: Cigarettes   Smokeless tobacco: Never  Vaping Use   Vaping Use: Never used  Substance and Sexual Activity   Alcohol use: Yes    Comment: 4 beers oper day, regular sized beers   Drug use: No   Sexual activity: Not on file  Other Topics Concern   Not on file  Social History Narrative   Not on file   Social Determinants of Health   Financial Resource Strain: Not on file  Food Insecurity: Not on file  Transportation Needs: Not on file  Physical Activity: Not on file  Stress: Not on file  Social Connections: Not on file  Intimate Partner Violence: Not on file    Family History  Problem Relation Age of Onset   Diabetes Mother    Hyperlipidemia Father    Hypertension Father    CAD Father    Heart attack Father    Liver cancer Maternal Grandmother    Leukemia Maternal Grandfather    Colon cancer Neg Hx    Esophageal cancer Neg Hx    Stomach cancer Neg Hx    Rectal cancer Neg Hx     ROS- All systems are reviewed and negative except as per the HPI above  Physical Exam: Vitals:   04/13/22 1029  BP: 132/74  Pulse: (!) 58  Weight: 81.5 kg  Height: '5\' 8"'$  (1.727 m)    GEN- The patient is well appearing, alert and oriented x 3 today.   Head- normocephalic, atraumatic Eyes-  Sclera clear, conjunctiva pink Ears-  hearing intact Oropharynx- clear Neck- supple, no JVP Lymph- no cervical lymphadenopathy Lungs- Clear to ausculation bilaterally, normal work of breathing Heart- Regular rate and rhythm, no murmurs, rubs or gallops, PMI not laterally displaced GI- soft, NT, ND, + BS Extremities- no clubbing, cyanosis, or  edema MS- no significant deformity or atrophy Skin- no rash or lesion Psych- euthymic mood, full affect Neuro- strength and sensation are intact  EKG -Vent. rate 58 BPM PR interval 186 ms QRS duration 82 ms QT/QTcB 424/416 ms P-R-T axes 62 28 53 Sinus bradycardia Otherwise normal ECG When compared with ECG of 03-Oct-2021 10:41, PREVIOUS ECG IS PRESENT  Assessment and Plan: 1. PAF Maintaining SR on flecainide. Continue flecainide 50 mg bid  Continue metoprolol 25 mg bid   2. CHA2DS2VASc score of at least 4 Continue xarelto 20 mg daily  Labs from  November 2023 reviewed and appropriately dosed  3. HTN Stable    Cardiac CT score to be ordered for pt's risk factors, per her request, aware that she is will have to pay out of pocket. Has not had any exertional chest pain or shortness of breath.   I offered for her to establish with EP, in Dr. Jackalyn Lombard absence, but she states she will f/u with Afib clinic in 6 months.  Geroge Baseman Damoney Julia, [facility] [address] [place], [place] [address] [phone]

## 2022-04-14 ENCOUNTER — Other Ambulatory Visit (HOSPITAL_COMMUNITY): Payer: Self-pay | Admitting: Nurse Practitioner

## 2022-04-14 DIAGNOSIS — I480 Paroxysmal atrial fibrillation: Secondary | ICD-10-CM

## 2022-04-20 ENCOUNTER — Ambulatory Visit (HOSPITAL_COMMUNITY)
Admission: RE | Admit: 2022-04-20 | Discharge: 2022-04-20 | Disposition: A | Discharge status: Home / Self Care | Payer: Medicare HMO | Source: Ambulatory Visit | Attending: Nurse Practitioner | Admitting: Nurse Practitioner

## 2022-04-20 DIAGNOSIS — I480 Paroxysmal atrial fibrillation: Secondary | ICD-10-CM

## 2022-04-21 ENCOUNTER — Other Ambulatory Visit (HOSPITAL_COMMUNITY): Payer: Self-pay | Admitting: *Deleted

## 2022-04-21 DIAGNOSIS — R931 Abnormal findings on diagnostic imaging of heart and coronary circulation: Secondary | ICD-10-CM

## 2022-04-22 ENCOUNTER — Encounter (HOSPITAL_COMMUNITY): Payer: Self-pay | Admitting: Nurse Practitioner

## 2022-04-22 ENCOUNTER — Ambulatory Visit (HOSPITAL_COMMUNITY)
Admission: RE | Admit: 2022-04-22 | Discharge: 2022-04-22 | Disposition: A | Discharge status: Home / Self Care | Payer: Medicare HMO | Source: Ambulatory Visit | Attending: Nurse Practitioner | Admitting: Nurse Practitioner

## 2022-04-22 ENCOUNTER — Encounter (HOSPITAL_COMMUNITY): Payer: Self-pay | Admitting: *Deleted

## 2022-04-22 VITALS — BP 152/80 | HR 57 | Ht 68.0 in | Wt 179.6 lb

## 2022-04-22 DIAGNOSIS — I10 Essential (primary) hypertension: Secondary | ICD-10-CM | POA: Insufficient documentation

## 2022-04-22 DIAGNOSIS — I480 Paroxysmal atrial fibrillation: Secondary | ICD-10-CM | POA: Insufficient documentation

## 2022-04-22 DIAGNOSIS — E119 Type 2 diabetes mellitus without complications: Secondary | ICD-10-CM | POA: Diagnosis not present

## 2022-04-22 DIAGNOSIS — Z79899 Other long term (current) drug therapy: Secondary | ICD-10-CM | POA: Diagnosis not present

## 2022-04-22 DIAGNOSIS — Z7901 Long term (current) use of anticoagulants: Secondary | ICD-10-CM | POA: Insufficient documentation

## 2022-04-22 DIAGNOSIS — D6869 Other thrombophilia: Secondary | ICD-10-CM

## 2022-04-22 DIAGNOSIS — F17210 Nicotine dependence, cigarettes, uncomplicated: Secondary | ICD-10-CM | POA: Insufficient documentation

## 2022-04-22 NOTE — Progress Notes (Addendum)
Patient ID: Kellie Reynolds, female   DOB: 1955/06/14, 67 y.o.   MRN: [id number]     Primary Care Physician: Evangeline Gula, NP (Inactive) Referring Physician: Thompson Grayer, MD f/u   Kellie Reynolds is a 67 y.o. female with a h/o PAF on flecainide for f/u in the afib clinic, having seen Dr. Rayann Heman 1 year ago.  She reports that her afib burden is low and she is happy with the current afib management. She continues on xarelto  without bleeding issues. Blood pressure rechecked at 146/80. H/o afib ablation 04/18/12.It sounds she may be experiencing  a PC from time to time, no sustained heart irregularity. She is not experiencing any chest pain  or shortness of breath, but she is requesting a cardiac calcium score for her risk factors of tobacco use , HTN, age and prediabetes.   Pt returns to afib clinc, 04/22/22 to discuss finds on CT calcium cardiac score imaging. Pt did have evidence for calcium in the LAD at 62.2 % and Rt coronary artery at 80.7 %.with aortic antherosclerosis.  Overall 85 percentile. Pt was asked to stop flecainide with evidence of coronary disease and will refer to general cardiology. She never stopped post ablation as she did not want to rock the boat, but afib has been quiet. For now, she does not want to start another antiarrythmic.   Today, she denies symptoms of palpitations, chest pain, shortness of breath, orthopnea, PND, lower extremity edema, dizziness, presyncope, syncope, or neurologic sequela. The patient is tolerating medications without difficulties and is otherwise without complaint today.   Past Medical History:  Diagnosis Date   Arthritis    right knee   Breast CA ([place]) 2022   left   Family history of adverse reaction to anesthesia 53 yrs ago   daughter had malignant hyperthermia reaction with acentine, daughter has had surgeries since then and did welll   Fibrocystic breast disease    GERD (gastroesophageal reflux disease)    HTN (hypertension)     Hypercholesteremia    IDA (iron deficiency anemia)    MVP (mitral valve prolapse)    Paroxysmal atrial fibrillation (HCC)    a. s/p PVI   Skin cancer    Sleep apnea    no longer use CPAP   Past Surgical History:  Procedure Laterality Date   ATRIAL FIBRILLATION ABLATION N/A 04/18/2012   PVI by [facility]   BREAST LUMPECTOMY  2022   COLONOSCOPY WITH PROPOFOL N/A 11/24/2015   Procedure: COLONOSCOPY WITH PROPOFOL;  Surgeon: Garlan Fair, MD;  Location: [facility] ENDOSCOPY;  Service: Endoscopy;  Laterality: N/A;   EYE SURGERY Bilateral    lasix eye surgery both eyes   MENISECTOMY     Right knee   NASAL POLYP SURGERY  1990   NASAL SINUS SURGERY  1990   TEE WITHOUT CARDIOVERSION  04/17/2012   Procedure: TRANSESOPHAGEAL ECHOCARDIOGRAM (TEE);  Surgeon: Jettie Booze, MD;  Location: [facility] ENDOSCOPY;  Service: Cardiovascular;  Laterality: N/A;  pre ablation   TUBAL LIGATION      Current Outpatient Medications  Medication Sig Dispense Refill   acetaminophen (TYLENOL) 500 MG tablet Take 1,500 mg by mouth 3 (three) times daily as needed for headache.     alendronate (FOSAMAX) 70 MG tablet Take 70 mg by mouth once a week.     atorvastatin (LIPITOR) 20 MG tablet Take 20 mg by mouth daily.     Cholecalciferol (VITAMIN D3) 1.25 MG (50000 UT) CAPS Take 1 capsule by  mouth once a week.     citalopram (CELEXA) 10 MG tablet Take 10 mg by mouth daily.     Coenzyme Q10 200 MG capsule Take 200 mg by mouth daily.     ferrous sulfate 325 (65 FE) MG tablet Take 325 mg by mouth daily.     guaiFENesin (MUCINEX) 600 MG 12 hr tablet Take 600 mg by mouth daily as needed (allergies).     letrozole (FEMARA) 2.5 MG tablet Take 2.5 mg by mouth daily.     losartan (COZAAR) 50 MG tablet Take 50 mg by mouth in the morning and at bedtime.     methimazole (TAPAZOLE) 5 MG tablet Take 0.5 tablets (2.5 mg total) by mouth daily. 45 tablet 3   metoprolol tartrate (LOPRESSOR) 50 MG tablet TAKE 0.5 TABLETS BY MOUTH 2 TIMES  DAILY. (Patient taking differently: Take 25 mg by mouth 2 (two) times daily.) 90 tablet 3   omeprazole (PRILOSEC) 40 MG capsule TAKE 1 CAPSULE BY MOUTH 2 (TWO) TIMES DAILY 20-30 MINUTES BEFORE MEALS. 180 capsule 1   rivaroxaban (XARELTO) 20 MG TABS tablet Take 1 tablet (20 mg total) by mouth daily with supper. NEEDS CARDIOLOGY APPOINTMENT, CALL OFFICE 30 tablet 0   sodium chloride (OCEAN) 0.65 % nasal spray Place 1 spray into the nose 2 (two) times daily as needed. For dryness     No current facility-administered medications for this encounter.    Allergies  Allergen Reactions   Anastrozole Other (See Comments)    unknown   Latex Itching, Rash and Other (See Comments)    Elastic in underwear     Social History   Socioeconomic History   Marital status: Divorced    Spouse name: Not on file   Number of children: 1   Years of education: Not on file   Highest education level: Not on file  Occupational History   Not on file  Tobacco Use   Smoking status: Every Day    Packs/day: 1.00    Years: 50.00    Total pack years: 50.00    Types: Cigarettes   Smokeless tobacco: Never  Vaping Use   Vaping Use: Never used  Substance and Sexual Activity   Alcohol use: Yes    Comment: 4 beers oper day, regular sized beers   Drug use: No   Sexual activity: Not on file  Other Topics Concern   Not on file  Social History Narrative   Not on file   Social Determinants of Health   Financial Resource Strain: Not on file  Food Insecurity: Not on file  Transportation Needs: Not on file  Physical Activity: Not on file  Stress: Not on file  Social Connections: Not on file  Intimate Partner Violence: Not on file    Family History  Problem Relation Age of Onset   Diabetes Mother    Hyperlipidemia Father    Hypertension Father    CAD Father    Heart attack Father    Liver cancer Maternal Grandmother    Leukemia Maternal Grandfather    Colon cancer Neg Hx    Esophageal cancer Neg Hx     Stomach cancer Neg Hx    Rectal cancer Neg Hx     ROS- All systems are reviewed and negative except as per the HPI above  Physical Exam: Vitals:   04/22/22 1001  Weight: 81.5 kg  Height: '5\' 8"'$  (1.727 m)    GEN- The patient is well appearing, alert and oriented x  3 today.   Head- normocephalic, atraumatic Eyes-  Sclera clear, conjunctiva pink Ears- hearing intact Oropharynx- clear Neck- supple, no JVP Lymph- no cervical lymphadenopathy Lungs- Clear to ausculation bilaterally, normal work of breathing Heart- regular rate and rhythm, no murmurs, rubs or gallops, PMI not laterally displaced GI- soft, NT, ND, + BS Extremities- no clubbing, cyanosis, or edema MS- no significant deformity or atrophy Skin- no rash or lesion Psych- euthymic mood, full affect Neuro- strength and sensation are intact  EKG - Vent. rate 57 BPM PR interval 174 ms QRS duration 78 ms QT/QTcB 422/410 ms P-R-T axes 55 41 48 Sinus bradycardia Cannot rule out Anterior infarct , age undetermined Abnormal ECG When compared with ECG of 13-Apr-2022 11:04, PREVIOUS ECG IS PRESENT  04/20/22-CT cardiac calcium score -FINDINGS: Coronary arteries: Normal origins.   Coronary Calcium Score:   Left main: 0   Left anterior descending artery: 62.2   Left circumflex artery: 0   Right coronary artery: 80.7   Total: 143   Percentile: 85th   Pericardium: Normal.   Ascending Aorta: Normal caliber.   Non-cardiac: See separate report from [facility].   IMPRESSION: Coronary calcium score of 143 Agatston units. This was 85th percentile for age-, race-, and sex-matched controls.    Assessment and Plan: 1. PAF Maintaining SR since ablation She is now off flecainide with evidence of CAD For now she would like to watch and wait if return to afib she will consider drug choices or repeat ablation Continue metoprolol 25 mg bid  We discussed possibility of use of Multaq if afib returns. Pt will check  on price of drug   Smoking cessation encouraged   2. CHA2DS2VASc score of at least 4 Continue xarelto 20 mg daily  Labs from  November 2023 reviewed and appropriately dosed  3. HTN Stable   4. Calcium score of 85 percentile  Evidence of CAD in LAD and RCA Has been referred to cardiology Continue Lipitor but may need more intensive LDL lowering, last lipid panel scanned  in EPIC from  2020 showed LDL of 88  F/u here in 6 months or sooner if needed   Butch Penny C. Catina Nuss, [facility] [address] [place], [place] [address] [phone]

## 2022-04-22 NOTE — Addendum Note (Signed)
Encounter addended by: Sherran Needs, NP on: 04/22/2022 11:04 AM  Actions taken: Clinical Note Signed

## 2022-04-29 ENCOUNTER — Other Ambulatory Visit: Payer: Self-pay | Admitting: Physician Assistant

## 2022-04-29 DIAGNOSIS — D500 Iron deficiency anemia secondary to blood loss (chronic): Secondary | ICD-10-CM

## 2022-04-30 ENCOUNTER — Inpatient Hospital Stay: Payer: Medicare HMO | Attending: Physician Assistant

## 2022-04-30 ENCOUNTER — Other Ambulatory Visit: Payer: Self-pay

## 2022-04-30 ENCOUNTER — Inpatient Hospital Stay (HOSPITAL_BASED_OUTPATIENT_CLINIC_OR_DEPARTMENT_OTHER): Payer: Medicare HMO | Admitting: Physician Assistant

## 2022-04-30 VITALS — BP 148/74 | HR 56 | Temp 97.5°F | Resp 18 | Ht 68.0 in | Wt 182.0 lb

## 2022-04-30 DIAGNOSIS — C50912 Malignant neoplasm of unspecified site of left female breast: Secondary | ICD-10-CM | POA: Diagnosis not present

## 2022-04-30 DIAGNOSIS — Z170 Estrogen receptor positive status [ER+]: Secondary | ICD-10-CM | POA: Insufficient documentation

## 2022-04-30 DIAGNOSIS — Z853 Personal history of malignant neoplasm of breast: Secondary | ICD-10-CM

## 2022-04-30 DIAGNOSIS — K921 Melena: Secondary | ICD-10-CM | POA: Diagnosis present

## 2022-04-30 DIAGNOSIS — D500 Iron deficiency anemia secondary to blood loss (chronic): Secondary | ICD-10-CM

## 2022-04-30 DIAGNOSIS — Z79899 Other long term (current) drug therapy: Secondary | ICD-10-CM | POA: Diagnosis not present

## 2022-04-30 DIAGNOSIS — Z923 Personal history of irradiation: Secondary | ICD-10-CM | POA: Insufficient documentation

## 2022-04-30 DIAGNOSIS — N6459 Other signs and symptoms in breast: Secondary | ICD-10-CM | POA: Insufficient documentation

## 2022-04-30 DIAGNOSIS — Z79811 Long term (current) use of aromatase inhibitors: Secondary | ICD-10-CM | POA: Insufficient documentation

## 2022-04-30 DIAGNOSIS — F17210 Nicotine dependence, cigarettes, uncomplicated: Secondary | ICD-10-CM | POA: Diagnosis not present

## 2022-04-30 LAB — CBC WITH DIFFERENTIAL (CANCER CENTER ONLY)
Abs Immature Granulocytes: 0.01 10*3/uL (ref 0.00–0.07)
Basophils Absolute: 0 10*3/uL (ref 0.0–0.1)
Basophils Relative: 0 %
Eosinophils Absolute: 0.2 10*3/uL (ref 0.0–0.5)
Eosinophils Relative: 3 %
HCT: 39.7 % (ref 36.0–46.0)
Hemoglobin: 13.4 g/dL (ref 12.0–15.0)
Immature Granulocytes: 0 %
Lymphocytes Relative: 22 %
Lymphs Abs: 1.6 10*3/uL (ref 0.7–4.0)
MCH: 32.2 pg (ref 26.0–34.0)
MCHC: 33.8 g/dL (ref 30.0–36.0)
MCV: 95.4 fL (ref 80.0–100.0)
Monocytes Absolute: 0.6 10*3/uL (ref 0.1–1.0)
Monocytes Relative: 8 %
Neutro Abs: 4.9 10*3/uL (ref 1.7–7.7)
Neutrophils Relative %: 67 %
Platelet Count: 170 10*3/uL (ref 150–400)
RBC: 4.16 MIL/uL (ref 3.87–5.11)
RDW: 12.6 % (ref 11.5–15.5)
WBC Count: 7.3 10*3/uL (ref 4.0–10.5)
nRBC: 0 % (ref 0.0–0.2)

## 2022-04-30 LAB — IRON AND IRON BINDING CAPACITY (CC-WL,HP ONLY)
Iron: 140 ug/dL (ref 28–170)
Saturation Ratios: 45 % — ABNORMAL HIGH (ref 10.4–31.8)
TIBC: 309 ug/dL (ref 250–450)
UIBC: 169 ug/dL (ref 148–442)

## 2022-04-30 LAB — CMP (CANCER CENTER ONLY)
ALT: 11 U/L (ref 0–44)
AST: 13 U/L — ABNORMAL LOW (ref 15–41)
Albumin: 4.1 g/dL (ref 3.5–5.0)
Alkaline Phosphatase: 64 U/L (ref 38–126)
Anion gap: 5 (ref 5–15)
BUN: 7 mg/dL — ABNORMAL LOW (ref 8–23)
CO2: 32 mmol/L (ref 22–32)
Calcium: 9.2 mg/dL (ref 8.9–10.3)
Chloride: 104 mmol/L (ref 98–111)
Creatinine: 0.52 mg/dL (ref 0.44–1.00)
GFR, Estimated: >60 mL/min (ref >60)
Glucose, Bld: 126 mg/dL — ABNORMAL HIGH (ref 70–99)
Potassium: 4 mmol/L (ref 3.5–5.1)
Sodium: 141 mmol/L (ref 135–145)
Total Bilirubin: 0.6 mg/dL (ref 0.3–1.2)
Total Protein: 6.7 g/dL (ref 6.5–8.1)

## 2022-04-30 LAB — FERRITIN: Ferritin: 40 ng/mL (ref 11–307)

## 2022-04-30 NOTE — Progress Notes (Signed)
[place] [phone]   [phone]  PROGRESS NOTE  Patient Care Team: Evangeline Gula, NP (Inactive) as PCP - General (Family Medicine) Thompson Grayer, MD (Inactive) as PCP - Cardiology (Cardiology)  Hematological/Oncological History -10/03/2021: Hgb 7.8. MCV 75.4 -10/23/2021: Hgb 8.6, MCV 75.1, Iron 25, Ferritin 15.6, Saturation 5.1%.   -11/18/2021: Establish care with [facility] Hematology with Dr. Narda Rutherford and Dede Query PA-C  CHIEF COMPLAINTS/PURPOSE OF CONSULTATION:  Iron deficiency anemia H/O breast cancer  HISTORY OF PRESENTING ILLNESS:  Nanetta S Pemberton 67 y.o. female returns for a follow up for iron deficiency anemia and history of breast cancer. She is unaccompanied for this visit.   On exam today, Ms. Salamone her energy levels are fairly stable. She does have some fatigue but is able to complete her ADLs on her own. Her weight and appetite are stable. She reports occasional episodes of nausea and vomiting. She still struggles with chronic loose stools and is under the care of gastroenterologist. She denies any hematochezia or melena. She is tolerating letrozole therapy without any significant toxicities. She denies fevers, chills, sweats, shortness of breath, chest pain or cough. She has no other complaints. Rest of the 10 point ROS is below.   MEDICAL HISTORY:  Past Medical History:  Diagnosis Date   Arthritis    right knee   Breast CA ([place]) 2022   left   Family history of adverse reaction to anesthesia 90 yrs ago   daughter had malignant hyperthermia reaction with acentine, daughter has had surgeries since then and did welll   Fibrocystic breast disease    GERD (gastroesophageal reflux disease)    HTN (hypertension)    Hypercholesteremia    IDA (iron deficiency anemia)    MVP (mitral valve prolapse)    Paroxysmal atrial fibrillation (HCC)    a. s/p PVI   Skin cancer    Sleep apnea    no longer use CPAP    SURGICAL  HISTORY: Past Surgical History:  Procedure Laterality Date   ATRIAL FIBRILLATION ABLATION N/A 04/18/2012   PVI by Allred   BREAST LUMPECTOMY  2022   COLONOSCOPY WITH PROPOFOL N/A 11/24/2015   Procedure: COLONOSCOPY WITH PROPOFOL;  Surgeon: Garlan Fair, MD;  Location: [facility];  Service: Endoscopy;  Laterality: N/A;   EYE SURGERY Bilateral    lasix eye surgery both eyes   MENISECTOMY     Right knee   NASAL POLYP SURGERY  1990   NASAL SINUS SURGERY  1990   TEE WITHOUT CARDIOVERSION  04/17/2012   Procedure: TRANSESOPHAGEAL ECHOCARDIOGRAM (TEE);  Surgeon: Jettie Booze, MD;  Location: [facility] ENDOSCOPY;  Service: Cardiovascular;  Laterality: N/A;  pre ablation   TUBAL LIGATION      SOCIAL HISTORY: Social History   Socioeconomic History   Marital status: Divorced    Spouse name: Not on file   Number of children: 1   Years of education: Not on file   Highest education level: Not on file  Occupational History   Not on file  Tobacco Use   Smoking status: Every Day    Packs/day: 1.00    Years: 50.00    Total pack years: 50.00    Types: Cigarettes   Smokeless tobacco: Never  Vaping Use   Vaping Use: Never used  Substance and Sexual Activity   Alcohol use: Yes    Comment: 4 beers oper day, regular sized beers   Drug use: No   Sexual activity: Not on file  Other Topics Concern   Not on file  Social History Narrative   Not on file   Social Determinants of Health   Financial Resource Strain: Not on file  Food Insecurity: Not on file  Transportation Needs: Not on file  Physical Activity: Not on file  Stress: Not on file  Social Connections: Not on file  Intimate Partner Violence: Not on file    FAMILY HISTORY: Family History  Problem Relation Age of Onset   Diabetes Mother    Hyperlipidemia Father    Hypertension Father    CAD Father    Heart attack Father    Liver cancer Maternal Grandmother    Leukemia Maternal Grandfather    Colon cancer Neg Hx     Esophageal cancer Neg Hx    Stomach cancer Neg Hx    Rectal cancer Neg Hx     ALLERGIES:  is allergic to anastrozole and latex.  MEDICATIONS:  Current Outpatient Medications  Medication Sig Dispense Refill   acetaminophen (TYLENOL) 500 MG tablet Take 1,500 mg by mouth 3 (three) times daily as needed for headache.     alendronate (FOSAMAX) 70 MG tablet Take 70 mg by mouth once a week.     atorvastatin (LIPITOR) 20 MG tablet Take 20 mg by mouth daily.     Cholecalciferol (VITAMIN D3) 1.25 MG (50000 UT) CAPS Take 1 capsule by mouth once a week.     citalopram (CELEXA) 10 MG tablet Take 10 mg by mouth daily.     Coenzyme Q10 200 MG capsule Take 200 mg by mouth daily.     ferrous sulfate 325 (65 FE) MG tablet Take 325 mg by mouth daily.     guaiFENesin (MUCINEX) 600 MG 12 hr tablet Take 600 mg by mouth daily as needed (allergies).     letrozole (FEMARA) 2.5 MG tablet Take 2.5 mg by mouth daily.     losartan (COZAAR) 50 MG tablet Take 50 mg by mouth in the morning and at bedtime.     methimazole (TAPAZOLE) 5 MG tablet Take 0.5 tablets (2.5 mg total) by mouth daily. 45 tablet 3   metoprolol tartrate (LOPRESSOR) 50 MG tablet TAKE 0.5 TABLETS BY MOUTH 2 TIMES DAILY. (Patient taking differently: Take 25 mg by mouth 2 (two) times daily.) 90 tablet 3   omeprazole (PRILOSEC) 40 MG capsule TAKE 1 CAPSULE BY MOUTH 2 (TWO) TIMES DAILY 20-30 MINUTES BEFORE MEALS. 180 capsule 1   rivaroxaban (XARELTO) 20 MG TABS tablet Take 1 tablet (20 mg total) by mouth daily with supper. NEEDS CARDIOLOGY APPOINTMENT, CALL OFFICE 30 tablet 0   sodium chloride (OCEAN) 0.65 % nasal spray Place 1 spray into the nose 2 (two) times daily as needed. For dryness     No current facility-administered medications for this visit.    REVIEW OF SYSTEMS:   Constitutional: ( - ) fevers, ( - )  chills , ( - ) night sweats Eyes: ( - ) blurriness of vision, ( - ) double vision, ( - ) watery eyes Ears, nose, mouth, throat, and face: (  - ) mucositis, ( - ) sore throat Respiratory: ( - ) cough, ( + ) dyspnea, ( - ) wheezes Cardiovascular: ( - ) palpitation, ( - ) chest discomfort, ( - ) lower extremity swelling Gastrointestinal:  ( + ) nausea, ( - ) heartburn, ( + ) change in bowel habits Skin: ( - ) abnormal skin rashes Lymphatics: ( - ) new lymphadenopathy, ( - ) easy  bruising Neurological: ( - ) numbness, ( - ) tingling, ( - ) new weaknesses Behavioral/Psych: ( - ) mood change, ( - ) new changes  All other systems were reviewed with the patient and are negative.  PHYSICAL EXAMINATION: ECOG PERFORMANCE STATUS: 1 - Symptomatic but completely ambulatory  Vitals:   04/30/22 1102  BP: (!) 148/74  Pulse: (!) 56  Resp: 18  Temp: (!) 97.5 F (36.4 C)  SpO2: 97%   Filed Weights   04/30/22 1102  Weight: 182 lb (82.6 kg)    GENERAL: well appearing female in NAD  SKIN: skin color, texture, turgor are normal, no rashes or significant lesions EYES: conjunctiva are pink and non-injected, sclera clear.  LUNGS: clear to auscultation and percussion with normal breathing effort HEART: regular rate & rhythm and no murmurs and no lower extremity edema Musculoskeletal: no cyanosis of digits and no clubbing  PSYCH: alert & oriented x 3, fluent speech NEURO: no focal motor/sensory deficits BREAT: firmness in left upper breast near lumpectomy site. No palpable masses appreciated. No axillary lymphadenopathy.   LABORATORY DATA:  I have reviewed the data as listed    Latest Ref Rng & Units 04/30/2022    9:55 AM 03/01/2022    4:12 PM 01/26/2022   12:34 PM  CBC  WBC 4.0 - 10.5 K/uL 7.3  10.5  6.3   Hemoglobin 12.0 - 15.0 g/dL 13.4  13.8  12.8   Hematocrit 36.0 - 46.0 % 39.7  40.6  37.5   Platelets 150 - 400 K/uL 170  238.0  195        Latest Ref Rng & Units 04/30/2022    9:55 AM 01/26/2022   12:34 PM 11/18/2021   12:28 PM  CMP  Glucose 70 - 99 mg/dL 126  170  112   BUN 8 - 23 mg/dL 7  6  5   $ Creatinine 0.44 - 1.00 mg/dL  0.52  0.61  0.63   Sodium 135 - 145 mmol/L 141  137  140   Potassium 3.5 - 5.1 mmol/L 4.0  3.9  4.0   Chloride 98 - 111 mmol/L 104  101  105   CO2 22 - 32 mmol/L 32  29  30   Calcium 8.9 - 10.3 mg/dL 9.2  9.2  9.5   Total Protein 6.5 - 8.1 g/dL 6.7  6.9  6.9   Total Bilirubin 0.3 - 1.2 mg/dL 0.6  0.3  0.3   Alkaline Phos 38 - 126 U/L 64  85  62   AST 15 - 41 U/L 13  14  18   $ ALT 0 - 44 U/L 11  10  12     $ RADIOGRAPHIC STUDIES: I have personally reviewed the radiological images as listed and agreed with the findings in the report. CT CARDIAC SCORING (SELF PAY ONLY)  Addendum Date: 04/22/2022   ADDENDUM REPORT: 04/22/2022 08:27 EXAM: OVER-READ INTERPRETATION  CT CHEST The following report is an over-read performed by radiologist Dr. Eusebio Friendly [facility] on 04/22/2022. This over-read does not include interpretation of cardiac or coronary anatomy or pathology. The cardiovascular interpretation by the cardiologist is attached. COMPARISON:  Chest CT, 04/12/2022. FINDINGS: Aortic atherosclerotic calcifications stable from the recent prior CT. No mediastinal or hilar masses. No enlarged lymph nodes. Unremarkable tracheobronchial tree. Visualized lungs are clear.  No pleural effusion or pneumothorax. No fracture or acute finding.  No bone lesion.  No chest wall mass. IMPRESSION: 1. No acute findings. 2. Aortic atherosclerosis.  Visualized lungs are clear. No change compared to the recent low-dose lung cancer screening chest CT. Electronically Signed   By: Lajean Manes M.D.   On: 04/22/2022 08:27   Result Date: 04/22/2022 CLINICAL DATA:  Cardiovascular Disease Risk stratification EXAM: Coronary Calcium Score TECHNIQUE: A gated, non-contrast computed tomography scan of the heart was performed using 65m slice thickness. Axial images were analyzed on a dedicated workstation. Calcium scoring of the coronary arteries was performed using the Agatston method. MEDICATIONS: MEDICATIONS None FINDINGS:  Coronary arteries: Normal origins. Coronary Calcium Score: Left main: 0 Left anterior descending artery: 62.2 Left circumflex artery: 0 Right coronary artery: 80.7 Total: 143 Percentile: 85th Pericardium: Normal. Ascending Aorta: Normal caliber. [facility]-cardiac: [facility] IMPRESSION: Coronary calcium score of 143 Agatston units. This was 85th percentile for age-, race-, and sex-matched controls. RECOMMENDATIONS: Coronary artery calcium (CAC) score is a strong predictor of incident coronary heart disease (CHD) and provides predictive information beyond traditional risk factors. CAC scoring is reasonable to use in the decision to withhold, postpone, or initiate statin therapy in intermediate-risk or selected borderline-risk asymptomatic adults (age 66-75years and LDL-C >=70 to <190 mg/dL) who do not have diabetes or established atherosclerotic cardiovascular disease (ASCVD).* In intermediate-risk (10-year ASCVD risk >=7.5% to <20%) adults or selected borderline-risk (10-year ASCVD risk >=5% to <7.5%) adults in whom a CAC score is measured for the purpose of making a treatment decision the following recommendations have been made: If CAC=0, it is reasonable to withhold statin therapy and reassess in 5 to 10 years, as long as higher risk conditions are absent (diabetes mellitus, family history of premature CHD in first degree relatives (males <55 years; females <65 years), cigarette smoking, or LDL >=190 mg/dL). If CAC is 1 to 99, it is reasonable to initiate statin therapy for patients >=582years of age. If CAC is >=100 or >=75th percentile, it is reasonable to initiate statin therapy at any age. Cardiology referral should be considered for patients with CAC scores >=400 or >=75th percentile. *2018 AHA/ACC/AACVPR/AAPA/ABC/ACPM/ADA/AGS/APhA/ASPC/NLA/PCNA Guideline on the Management of Blood Cholesterol: A Report of the American College of Cardiology/American Heart Association Task Force  on Clinical Practice Guidelines. J Am Coll Cardiol. 2019;73(24):3168-3209. DLoralie Champagne MD Electronically Signed: By: DLoralie ChampagneM.D. On: 04/20/2022 15:20   CT CHEST LUNG CA SCREEN LOW DOSE W/O CM  Result Date: 04/13/2022 CLINICAL DATA:  67year old female with 75 pack-year history of smoking. Lung cancer screening. EXAM: CT CHEST WITHOUT CONTRAST LOW-DOSE FOR LUNG CANCER SCREENING TECHNIQUE: Multidetector CT imaging of the chest was performed following the standard protocol without IV contrast. RADIATION DOSE REDUCTION: This exam was performed according to the departmental dose-optimization program which includes automated exposure control, adjustment of the mA and/or kV according to patient size and/or use of iterative reconstruction technique. COMPARISON:  03/25/2021 FINDINGS: Cardiovascular: The heart size is normal. No substantial pericardial effusion. Coronary artery calcification is evident. Mild atherosclerotic calcification is noted in the wall of the thoracic aorta. Mediastinum/Nodes: No mediastinal lymphadenopathy. No evidence for gross hilar lymphadenopathy although assessment is limited by the lack of intravenous contrast on the current study. The esophagus has normal imaging features. There is no axillary lymphadenopathy. Lungs/Pleura: Centrilobular and paraseptal emphysema evident. Biapical pleuroparenchymal scarring again noted. Tiny left perifissural nodule identified previously is stable in the interval. No new suspicious pulmonary nodule or mass. No focal airspace consolidation. No pleural effusion. Upper Abdomen: Unremarkable. Musculoskeletal: No worrisome lytic or sclerotic osseous abnormality. IMPRESSION: 1. Lung-RADS 2, benign  appearance or behavior. Continue annual screening with low-dose chest CT without contrast in 12 months. 2. Coronary artery atherosclerosis. 3. Emphysema (ICD10-J43.9) and Aortic Atherosclerosis (ICD10-170.0) Electronically Signed   By: Misty Stanley M.D.   On:  04/13/2022 10:19   US THYROID  Result Date: 04/07/2022 CLINICAL DATA:  Annual follow up on [facility] EXAM: THYROID ULTRASOUND TECHNIQUE: Ultrasound examination of the thyroid gland and adjacent soft tissues was performed. COMPARISON:  03/25/2021 FINDINGS: Parenchymal Echotexture: Mildly heterogenous Isthmus: 0.5 cm Right lobe: 6.9 x 2.1 x 2.6 cm Left lobe: 6.3 x 2.6 x 3.1 cm _________________________________________________________ Estimated total number of nodules >/= 1 cm: 3 Number of spongiform nodules >/=  2 cm not described below (TR1): 0 Number of mixed cystic and solid nodules >/= 1.5 cm not described below (TR2): 0 _________________________________________________________ Nodule 1: 0.9 cm cystic right superior thyroid nodule does not meet criteria for imaging surveillance or FNA. _________________________________________________________ Nodule # 2: Prior biopsy: No Location: Right; mid Maximum size: 1.1 cm; Other 2 dimensions: 1.1 x 0.8 cm, previously, 1.1 x 1.1 x 0.8 cm Composition: solid/almost completely solid (2) Echogenicity: hypoechoic (2) Shape: not taller-than-wide (0) Margins: ill-defined (0) Echogenic foci: macrocalcifications (1) ACR TI-RADS total points: 5. ACR TI-RADS risk category:  TR4 (4-6 points). Significant change in size (>/= 20% in two dimensions and minimal increase of 2 mm): No Change in features: No Change in ACR TI-RADS risk category: No ACR TI-RADS recommendations: *Given size (>/= 1 - 1.4 cm) and appearance, a follow-up ultrasound in 1 year should be considered based on TI-RADS criteria. _________________________________________________________ Nodule # 3: Prior biopsy: No Location: Right; Mid Maximum size: 2.2 cm; Other 2 dimensions: 1.6 x 1.0 cm, previously, 1.9 x 1.4 x 0.9 cm Composition: solid/almost completely solid (2) Echogenicity: isoechoic (1) Shape: not taller-than-wide (0) Margins: smooth (0) Echogenic foci: none (0) ACR TI-RADS total points: 3. ACR TI-RADS risk category:   TR3 (3 points). Significant change in size (>/= 20% in two dimensions and minimal increase of 2 mm): No Change in features: No Change in ACR TI-RADS risk category: No ACR TI-RADS recommendations: *Given size (>/= 1.5 - 2.4 cm) and appearance, a follow-up ultrasound in 1 year should be considered based on TI-RADS criteria. _________________________________________________________ Nodule 4: 1.2 x 1.0 x 1.0 cm hypoechoic nodule in the inferior right thyroid lobe was clearly spongiform on the prior examination it appears more solid on the current exam due to loss of fluid. This does not require further imaging follow-up. IMPRESSION: 1. Nodule 2 (TI-RADS 4), measuring 1.1 x 1.1 x 0.8 cm, located in the mid right thyroid lobe has not significantly changed in size since 03/25/2021 and still meets criteria for imaging follow-up. 2. Nodule 3 (TI-RADS 3), measuring 2.2 x 1.6 x 1.0 cm, previously 1.9 x 1.4 x 0.9 cm, located in the mid right thyroid lobe has not significantly changed in size since prior examination and still meets criteria for imaging follow-up. This nodule appears more solid on the current examination, however this is due to loss of fluid. The above is in keeping with the ACR TI-RADS recommendations - J Am Coll Radiol 2017;14:587-595. Electronically Signed   By: Miachel Roux M.D.   On: 04/07/2022 08:41    ASSESSMENT & PLAN HAYVEN KOESTLER is a 67 y.o. returns for a follow up for iron deficiency anemia and history of left breast cancer.   #Iron deficiency anemia 2/2 chronic blood loss: --Currently on ferrous sulfate 325 mg once a day with a source of  vitamin C --Patient follows vegetarian diet so provided list of iron rich foods to incorporate into diet.  --Under the care of Dr. Candis Schatz (GI) and underwent EGD/colonoscopy from 06/05/2021 showed no evidence of active bleeding. Plan to repeat colonoscopy with ablation of cecal AVM if patient exhibits evidence of rebleeding --Underwent capsular EGD on  11/09/2021 that was incomplete due to capsule retention in the stomach. Small bowel enteroscopy was done on 12/07/2021 that showed no evidence of significant pathology in the entire duodenum/proximal jejunum. --Labs today show anemia has resolved with Hgb 13.4. Iron panel shows no evidence of deficiency.  --No need for IV iron at this time.  #H/o left breast cancer --Diagnosed in June 2022 and treated at [place] lumpectomy on 07/29/2020. 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 --Not enough tissue for Oncotype testing.  --Received adjust radiation to the left breast 10/15/2020-11/11/2020.  --Patient requested to transfer care to [facility] due to convenience of travel.  --Status post adjuvant endocrine therapy with anastrozole 1 mg daily, 11/24/2020, discontinued October 2022 secondary to myalgias and arthralgias.  --Started adjuvant endocrine therapy with letrozole 2.5 mg daily on 02/03/2021. Plan for total of 7 years of therapy.  --Last mammogram was on 05/18/2021 that showed no evidence of malignancies. Next mammogram due in March 2024. Order placed. --Due to firmness palpated in left upper breast today, we will request breast US along with mammogram --Last bone density study was in November 2022 and findings consistent with osteopenia. Current on regimen for bone health including vitamin D/calcium and foasamax. Due for repeat bone density November 2024.   Follow up: --6 months: labs and follow up visit   Orders Placed This Encounter  Procedures   MS DIGITAL SCREENING TOMO BILATERAL    Standing Status:   Future    Standing Expiration Date:   05/01/2023    Order Specific Question:   Reason for Exam (SYMPTOM  OR DIAGNOSIS REQUIRED)    Answer:   history of left breast cancer s/p lumpectomy. firmness noted in left upper breast near surgical site.    Order Specific Question:   Preferred imaging  location?    Answer:   [facility]   US BREAST LTD UNI LEFT INC AXILLA    Standing Status:   Future    Standing Expiration Date:   05/01/2023    Order Specific Question:   Reason for Exam (SYMPTOM  OR DIAGNOSIS REQUIRED)    Answer:   history of left breast cancer s/p lumpectomy. firmness noted in left upper breast near surgical site.    Order Specific Question:   Preferred imaging location?    Answer:   [facility]    All questions were answered. The patient knows to call the clinic with any problems, questions or concerns.  I have spent a total of 30 minutes minutes of face-to-face and non-face-to-face time, preparing to see the patient, performing a medically appropriate examination, counseling and educating the patient, ordering tests, communicating with other health care professionals,  and care coordination.   Dede Query, PA-C Department of Hematology/Oncology [place] [facility] Phone: [phone]

## 2022-05-03 ENCOUNTER — Telehealth: Payer: Self-pay | Admitting: Hematology and Oncology

## 2022-05-03 NOTE — Telephone Encounter (Signed)
Contacted patient to scheduled appointments. Patient is aware of appointments that are scheduled.   

## 2022-05-13 ENCOUNTER — Ambulatory Visit
Admission: RE | Admit: 2022-05-13 | Discharge: 2022-05-13 | Disposition: A | Discharge status: Home / Self Care | Payer: Medicare HMO | Source: Ambulatory Visit | Attending: Physician Assistant | Admitting: Physician Assistant

## 2022-05-13 DIAGNOSIS — Z853 Personal history of malignant neoplasm of breast: Secondary | ICD-10-CM

## 2022-05-14 ENCOUNTER — Telehealth: Payer: Self-pay

## 2022-05-14 NOTE — Telephone Encounter (Signed)
LM for pt with results.

## 2022-05-17 ENCOUNTER — Ambulatory Visit: Payer: Medicare HMO | Attending: Cardiology | Admitting: Cardiology

## 2022-05-17 ENCOUNTER — Encounter: Payer: Self-pay | Admitting: Cardiology

## 2022-05-17 VITALS — BP 132/70 | HR 73 | Ht 68.0 in | Wt 178.4 lb

## 2022-05-17 DIAGNOSIS — R072 Precordial pain: Secondary | ICD-10-CM | POA: Diagnosis not present

## 2022-05-17 DIAGNOSIS — E785 Hyperlipidemia, unspecified: Secondary | ICD-10-CM

## 2022-05-17 MED ORDER — RIVAROXABAN 20 MG PO TABS: 20.0000 mg | ORAL_TABLET | Freq: Every day | ORAL | 5 refills | Status: DC

## 2022-05-17 NOTE — Patient Instructions (Addendum)
Medication Instructions:  The current medical regimen is effective;  continue present plan and medications.  *If you need a refill on your cardiac medications before your next appointment, please call your pharmacy*   Lab Work: Please have blood work today (Lipid)  If you have labs (blood work) drawn today and your tests are completely normal, you will receive your results only by: [place] (if you have MyChart) OR A paper copy in the mail If you have any lab test that is abnormal or we need to change your treatment, we will call you to review the results.   Testing/Procedures: How to Prepare for Your Cardiac PET/CT Stress Test:  1. Please do not take these medications before your test:   Medications that may interfere with the cardiac pharmacological stress agent (ex. nitrates - including erectile dysfunction medications, isosorbide mononitrate, tamulosin or beta-blockers) the day of the exam. (Erectile dysfunction medication should be held for at least 72 hrs prior to test) Theophylline containing medications for 12 hours. Dipyridamole 48 hours prior to the test. Your remaining medications may be taken with water.  2. Nothing to eat or drink, except water, 3 hours prior to arrival time.   NO caffeine/decaffeinated products, or chocolate 12 hours prior to arrival.  3. NO perfume, cologne or lotion  4. Total time is 1 to 2 hours; you may want to bring reading material for the waiting time.  5. Please report to Radiology at the [facility] Main Entrance 30 minutes early for your test.  [place], [place] [address]  Diabetic Preparation:  Hold oral medications. You may take NPH and Lantus insulin. Do not take Humalog or Humulin R (Regular Insulin) the day of your test. Check blood sugars prior to leaving the house. If able to eat breakfast prior to 3 hour fasting, you may take all medications, including your insulin, Do not worry if you miss  your breakfast dose of insulin - start at your next meal.  IF YOU THINK YOU MAY BE PREGNANT, OR ARE NURSING PLEASE INFORM THE TECHNOLOGIST.  In preparation for your appointment, medication and supplies will be purchased.  Appointment availability is limited, so if you need to cancel or reschedule, please call the Radiology Department at [phone]  24 hours in advance to avoid a cancellation fee of $100.00  What to Expect After you Arrive:  Once you arrive and check in for your appointment, you will be taken to a preparation room within the Radiology Department.  A technologist or Nurse will obtain your medical history, verify that you are correctly prepped for the exam, and explain the procedure.  Afterwards,  an IV will be started in your arm and electrodes will be placed on your skin for EKG monitoring during the stress portion of the exam. Then you will be escorted to the PET/CT scanner.  There, staff will get you positioned on the scanner and obtain a blood pressure and EKG.  During the exam, you will continue to be connected to the EKG and blood pressure machines.  A small, safe amount of a radioactive tracer will be injected in your IV to obtain a series of pictures of your heart along with an injection of a stress agent.    After your Exam:  It is recommended that you eat a meal and drink a caffeinated beverage to counter act any effects of the stress agent.  Drink plenty of fluids for the remainder of the day and urinate  frequently for the first couple of hours after the exam.  Your doctor will inform you of your test results within 7-10 business days.  For questions about your test or how to prepare for your test, please call: Marchia Bond, Cardiac Imaging Nurse Navigator  Gordy Clement, Cardiac Imaging Nurse Navigator Office: [phone]  Follow-Up: At [facility], you and your health needs are our priority.  As part of our continuing mission to provide you with  exceptional heart care, we have created designated Provider Care Teams.  These Care Teams include your primary Cardiologist (physician) and Advanced Practice Providers (APPs -  Physician Assistants and Nurse Practitioners) who all work together to provide you with the care you need, when you need it.  We recommend signing up for the patient portal called "MyChart".  Sign up information is provided on this After Visit Summary.  MyChart is used to connect with patients for Virtual Visits (Telemedicine).  Patients are able to view lab/test results, encounter notes, upcoming appointments, etc.  Non-urgent messages can be sent to your provider as well.   To learn more about what you can do with MyChart, go to [email or web address].    Your next appointment:   6 month(s)  Provider:   Dr Candee Furbish

## 2022-05-17 NOTE — Progress Notes (Signed)
Cardiology Office Note:    Date:  05/17/2022   ID:  Kellie Reynolds, DOB 01/03/1956, MRN [id number]  PCP:  Evangeline Gula, NP (Inactive)   [place] Providers Cardiologist:  Candee Furbish, MD     Referring MD: Sherran Needs, NP    History of Present Illness:    Kellie Reynolds is a 67 y.o. female here for the evaluation of elevated coronary calcium score at the request of Valarie Merino, NP  She has experienced occasional sharp chest precordial pain fleeting at times.  She also experiences shortness of breath with activity which she is attributed to smoking over 50 years.  Mother died at age 40 with heart failure, father died at age 74 from heart attack.    Past Medical History:  Diagnosis Date   Arthritis    right knee   Breast CA ([place]) 2022   left   Family history of adverse reaction to anesthesia 75 yrs ago   daughter had malignant hyperthermia reaction with acentine, daughter has had surgeries since then and did welll   Fibrocystic breast disease    GERD (gastroesophageal reflux disease)    HTN (hypertension)    Hypercholesteremia    IDA (iron deficiency anemia)    MVP (mitral valve prolapse)    Paroxysmal atrial fibrillation (HCC)    a. s/p PVI   Skin cancer    Sleep apnea    no longer use CPAP    Past Surgical History:  Procedure Laterality Date   ATRIAL FIBRILLATION ABLATION N/A 04/18/2012   PVI by [facility]   BREAST LUMPECTOMY  2022   COLONOSCOPY WITH PROPOFOL N/A 11/24/2015   Procedure: COLONOSCOPY WITH PROPOFOL;  Surgeon: Garlan Fair, MD;  Location: [facility] ENDOSCOPY;  Service: Endoscopy;  Laterality: N/A;   EYE SURGERY Bilateral    lasix eye surgery both eyes   MENISECTOMY     Right knee   NASAL POLYP SURGERY  1990   NASAL SINUS SURGERY  1990   TEE WITHOUT CARDIOVERSION  04/17/2012   Procedure: TRANSESOPHAGEAL ECHOCARDIOGRAM (TEE);  Surgeon: Jettie Booze, MD;  Location: [facility] ENDOSCOPY;  Service: Cardiovascular;  Laterality:  N/A;  pre ablation   TUBAL LIGATION      Current Medications: Current Meds  Medication Sig   acetaminophen (TYLENOL) 500 MG tablet Take 1,500 mg by mouth 3 (three) times daily as needed for headache.   alendronate (FOSAMAX) 70 MG tablet Take 70 mg by mouth once a week.   atorvastatin (LIPITOR) 20 MG tablet Take 20 mg by mouth daily.   Cholecalciferol (VITAMIN D3) 1.25 MG (50000 UT) CAPS Take 1 capsule by mouth once a week.   citalopram (CELEXA) 10 MG tablet Take 10 mg by mouth daily.   Coenzyme Q10 200 MG capsule Take 200 mg by mouth daily.   ferrous sulfate 325 (65 FE) MG tablet Take 325 mg by mouth daily.   guaiFENesin (MUCINEX) 600 MG 12 hr tablet Take 600 mg by mouth daily as needed (allergies).   letrozole (FEMARA) 2.5 MG tablet Take 2.5 mg by mouth daily.   losartan (COZAAR) 50 MG tablet Take 50 mg by mouth in the morning and at bedtime.   methimazole (TAPAZOLE) 5 MG tablet Take 0.5 tablets (2.5 mg total) by mouth daily.   metoprolol tartrate (LOPRESSOR) 50 MG tablet TAKE 0.5 TABLETS BY MOUTH 2 TIMES DAILY. (Patient taking differently: Take 25 mg by mouth 2 (two) times daily.)   omeprazole (PRILOSEC) 40 MG capsule TAKE  1 CAPSULE BY MOUTH 2 (TWO) TIMES DAILY 20-30 MINUTES BEFORE MEALS.   sodium chloride (OCEAN) 0.65 % nasal spray Place 1 spray into the nose 2 (two) times daily as needed. For dryness   [DISCONTINUED] rivaroxaban (XARELTO) 20 MG TABS tablet Take 1 tablet (20 mg total) by mouth daily with supper. NEEDS CARDIOLOGY APPOINTMENT, CALL OFFICE     Allergies:   Anastrozole and Latex   Social History   Socioeconomic History   Marital status: Divorced    Spouse name: Not on file   Number of children: 1   Years of education: Not on file   Highest education level: Not on file  Occupational History   Not on file  Tobacco Use   Smoking status: Every Day    Packs/day: 1.00    Years: 50.00    Total pack years: 50.00    Types: Cigarettes   Smokeless tobacco: Never   Vaping Use   Vaping Use: Never used  Substance and Sexual Activity   Alcohol use: Yes    Comment: 4 beers oper day, regular sized beers   Drug use: No   Sexual activity: Not on file  Other Topics Concern   Not on file  Social History Narrative   Not on file   Social Determinants of Health   Financial Resource Strain: Not on file  Food Insecurity: Not on file  Transportation Needs: Not on file  Physical Activity: Not on file  Stress: Not on file  Social Connections: Not on file     Family History: The patient's family history includes CAD in her father; Diabetes in her mother; Heart attack in her father; Hyperlipidemia in her father; Hypertension in her father; Leukemia in her maternal grandfather; Liver cancer in her maternal grandmother. There is no history of Colon cancer, Esophageal cancer, Stomach cancer, or Rectal cancer.  ROS:   Please see the history of present illness.     All other systems reviewed and are negative.  EKGs/Labs/Other Studies Reviewed:    The following studies were reviewed today: Cardiac Studies & Procedures     STRESS TESTS  MYOCARDIAL PERFUSION IMAGING 07/11/2017  Narrative  Nuclear stress EF: 60%.  There was no ST segment deviation noted during stress.  The study is normal.  This is a low risk study.  The left ventricular ejection fraction is normal (55-65%).  Low risk stress nuclear study with normal perfusion and normal left ventricular regional and global systolic function.   ECHOCARDIOGRAM  ECHOCARDIOGRAM COMPLETE 10/23/2021  Narrative ECHOCARDIOGRAM REPORT    Patient [facility]:   [facility] Date of Exam: 10/23/2021 Medical Rec #:  [id number]        Height:       68.0 in Accession #:    [id number]       Weight:       191.0 lb Date of Birth:  [birth year]         BSA:          2.004 m Patient Age:    58 years         BP:           115/75 mmHg Patient Gender: F                HR:           56 bpm. Exam Location:  [address]  Procedure: 3D Echo, 2D Echo, Cardiac Doppler and Color Doppler  Indications:    Atrial Fibrillation I48.0  History:        Patient has prior history of Echocardiogram examinations, most recent 04/19/2012. Mitral Valve Prolapse; Risk Factors:Hypertension.  Sonographer:    Mikki Santee RDCS Referring Phys: [place]   1. Left ventricular ejection fraction, by estimation, is 60 to 65%. Left ventricular ejection fraction by 3D volume is 59 %. The left ventricle has normal function. The left ventricle has no regional wall motion abnormalities. Left ventricular diastolic parameters were normal. 2. Right ventricular systolic function is normal. The right ventricular size is mildly enlarged. Tricuspid regurgitation signal is inadequate for assessing PA pressure. 3. Left atrial size was moderately dilated. 4. Right atrial size was mildly dilated. 5. The mitral valve is normal in structure. Mild mitral valve regurgitation. 6. The aortic valve is tricuspid. Aortic valve regurgitation is not visualized. No aortic stenosis is present. 7. The inferior vena cava is normal in size with greater than 50% respiratory variability, suggesting right atrial pressure of 3 mmHg.  Comparison(s): Prior images unable to be directly viewed, comparison made by report only.  FINDINGS Left Ventricle: Left ventricular ejection fraction, by estimation, is 60 to 65%. Left ventricular ejection fraction by 3D volume is 59 %. The left ventricle has normal function. The left ventricle has no regional wall motion abnormalities. The left ventricular internal cavity size was normal in size. There is no left ventricular hypertrophy. Left ventricular diastolic parameters were normal.  Right Ventricle: The right ventricular size is mildly enlarged. No increase in right ventricular wall thickness. Right ventricular systolic function is normal. Tricuspid regurgitation signal is inadequate for  assessing PA pressure.  Left Atrium: Left atrial size was moderately dilated.  Right Atrium: Right atrial size was mildly dilated.  Pericardium: There is no evidence of pericardial effusion.  Mitral Valve: The mitral valve is normal in structure. Mild mitral valve regurgitation, with centrally-directed jet.  Tricuspid Valve: The tricuspid valve is normal in structure. Tricuspid valve regurgitation is not demonstrated.  Aortic Valve: The aortic valve is tricuspid. Aortic valve regurgitation is not visualized. No aortic stenosis is present.  Pulmonic Valve: The pulmonic valve was normal in structure. Pulmonic valve regurgitation is trivial.  Aorta: The aortic root and ascending aorta are structurally normal, with no evidence of dilitation.  Venous: The inferior vena cava is normal in size with greater than 50% respiratory variability, suggesting right atrial pressure of 3 mmHg.  IAS/Shunts: No atrial level shunt detected by color flow Doppler.   LEFT VENTRICLE PLAX 2D LVIDd:         5.10 cm         Diastology LVIDs:         3.30 cm         LV e' medial:    5.66 cm/s LV PW:         0.90 cm         LV E/e' medial:  13.9 LV IVS:        1.00 cm         LV e' lateral:   12.50 cm/s LVOT diam:     2.30 cm         LV E/e' lateral: 6.3 LV SV:         88 LV SV Index:   44 LVOT Area:     4.15 cm        3D Volume EF LV 3D EF:    Left ventricul ar ejection fraction by 3D volume is 59 %.  3D  Volume EF: 3D EF:        59 % LV EDV:       153 ml LV ESV:       63 ml LV SV:        90 ml  RIGHT VENTRICLE RV Basal diam:  4.30 cm RV Mid diam:    2.50 cm RV S prime:     13.10 cm/s TAPSE (M-mode): 2.5 cm  LEFT ATRIUM              Index        RIGHT ATRIUM           Index LA diam:        3.90 cm  1.95 cm/m   RA Area:     22.20 cm LA Vol (A2C):   102.0 ml 50.89 ml/m  RA Volume:   67.90 ml  33.88 ml/m LA Vol (A4C):   79.9 ml  39.87 ml/m LA Biplane Vol: 92.0 ml  45.91 ml/m AORTIC  VALVE LVOT Vmax:   90.70 cm/s LVOT Vmean:  61.300 cm/s LVOT VTI:    0.213 m  AORTA Ao Root diam: 3.00 cm Ao Asc diam:  3.50 cm  MITRAL VALVE MV Area (PHT): 4.06 cm    SHUNTS MV Decel Time: 187 msec    Systemic VTI:  0.21 m MV E velocity: 78.60 cm/s  Systemic Diam: 2.30 cm MV A velocity: 53.50 cm/s MV E/A ratio:  1.47  Mihai Croitoru MD Electronically signed by Sanda Klein MD Signature Date/Time: 10/23/2021/12:37:39 PM    Final     CT SCANS  CT CARDIAC SCORING (SELF PAY ONLY) 04/22/2022  Addendum 04/22/2022  8:30 AM ADDENDUM REPORT: 04/22/2022 08:27  EXAM: OVER-READ INTERPRETATION  CT CHEST  The following report is an over-read performed by radiologist Dr. Eusebio Friendly [facility] on 04/22/2022. This over-read does not include interpretation of cardiac or coronary anatomy or pathology. The cardiovascular interpretation by the cardiologist is attached.  COMPARISON:  Chest CT, 04/12/2022.  FINDINGS: Aortic atherosclerotic calcifications stable from the recent prior CT. No mediastinal or hilar masses. No enlarged lymph nodes. Unremarkable tracheobronchial tree.  Visualized lungs are clear.  No pleural effusion or pneumothorax.  No fracture or acute finding.  No bone lesion.  No chest wall mass.  IMPRESSION: 1. No acute findings. 2. Aortic atherosclerosis. Visualized lungs are clear. No change compared to the recent low-dose lung cancer screening chest CT.   Electronically Signed By: Lajean Manes M.D. On: 04/22/2022 08:27  Narrative CLINICAL DATA:  Cardiovascular Disease Risk stratification  EXAM: Coronary Calcium Score  TECHNIQUE: A gated, non-contrast computed tomography scan of the heart was performed using 81m slice thickness. Axial images were analyzed on a dedicated workstation. Calcium scoring of the coronary arteries was performed using the Agatston method.  MEDICATIONS: MEDICATIONS None  FINDINGS: Coronary arteries: Normal  origins.  Coronary Calcium Score:  Left main: 0  Left anterior descending artery: 62.2  Left circumflex artery: 0  Right coronary artery: 80.7  Total: 143  Percentile: 85th  Pericardium: Normal.  Ascending Aorta: Normal caliber.  [facility]-cardiac: [facility]  IMPRESSION: Coronary calcium score of 143 Agatston units. This was 85th percentile for age-, race-, and sex-matched controls.  RECOMMENDATIONS: Coronary artery calcium (CAC) score is a strong predictor of incident coronary heart disease (CHD) and provides predictive information beyond traditional risk factors. CAC scoring is reasonable to use in the decision to withhold, postpone, or initiate statin therapy in  intermediate-risk or selected borderline-risk asymptomatic adults (age 43-75 years and LDL-C >=70 to <190 mg/dL) who do not have diabetes or established atherosclerotic cardiovascular disease (ASCVD).* In intermediate-risk (10-year ASCVD risk >=7.5% to <20%) adults or selected borderline-risk (10-year ASCVD risk >=5% to <7.5%) adults in whom a CAC score is measured for the purpose of making a treatment decision the following recommendations have been made:  If CAC=0, it is reasonable to withhold statin therapy and reassess in 5 to 10 years, as long as higher risk conditions are absent (diabetes mellitus, family history of premature CHD in first degree relatives (males <55 years; females <65 years), cigarette smoking, or LDL >=190 mg/dL).  If CAC is 1 to 99, it is reasonable to initiate statin therapy for patients >=17 years of age.  If CAC is >=100 or >=75th percentile, it is reasonable to initiate statin therapy at any age.  Cardiology referral should be considered for patients with CAC scores >=400 or >=75th percentile.  *2018 AHA/ACC/AACVPR/AAPA/ABC/ACPM/ADA/AGS/APhA/ASPC/NLA/PCNA Guideline on the Management of Blood Cholesterol: A Report of the American College of  Cardiology/American Heart Association Task Force on Clinical Practice Guidelines. J Am Coll Cardiol. 2019;73(24):3168-3209.  Loralie Champagne, MD  Electronically Signed: By: Loralie Champagne M.D. On: 04/20/2022 15:20           EKG: 04/22/2022: Sinus bradycardia 58 no other abnormalities.  Recent Labs: 10/03/2021: B Natriuretic Peptide 381.7 03/26/2022: TSH 1.10 04/30/2022: ALT 11; BUN 7; Creatinine 0.52; Hemoglobin 13.4; Platelet Count 170; Potassium 4.0; Sodium 141  Recent Lipid Panel    Component Value Date/Time   CHOL 184 04/16/2014 0950   TRIG 102.0 04/16/2014 0950   HDL 46.70 04/16/2014 0950   CHOLHDL 4 04/16/2014 0950   VLDL 20.4 04/16/2014 0950   LDLCALC 117 (H) 04/16/2014 0950              Physical Exam:    VS:  BP 132/70   Pulse 73   Ht '5\' 8"'$  (1.727 m)   Wt 178 lb 6.4 oz (80.9 kg)   SpO2 95%   BMI 27.13 kg/m     Wt Readings from Last 3 Encounters:  05/17/22 178 lb 6.4 oz (80.9 kg)  04/30/22 182 lb (82.6 kg)  04/22/22 179 lb 9.6 oz (81.5 kg)     GEN:  Well nourished, well developed in no acute distress HEENT: Normal NECK: No JVD; No carotid bruits LYMPHATICS: No lymphadenopathy CARDIAC: RRR, no murmurs, rubs, gallops RESPIRATORY:  Clear to auscultation without rales, wheezing or rhonchi  ABDOMEN: Soft, non-tender, non-distended MUSCULOSKELETAL:  No edema; No deformity  SKIN: Warm and dry NEUROLOGIC:  Alert and oriented x 3 PSYCHIATRIC:  Normal affect   ASSESSMENT:    1. Hyperlipidemia, unspecified hyperlipidemia type   2. Precordial pain    PLAN:    In order of problems listed above:  Coronary artery disease - Coronary calcification noted.  She has had some precordial chest discomfort at times.  Fleeting.  She also has shortness of breath with activity which she is attributed to longstanding smoking history. -We will go ahead and check a cardiac PET scan to ensure that she does not have any flow limitation or high risk ischemia present.  She has  coronary calcification in her proximal LAD as well as proximal right coronary artery.  Paroxysmal atrial fibrillation - Has had prior atrial fibrillation ablation, Dr. Rayann Heman. -We are stopping flecainide since she has had diagnosis of coronary artery disease present.  Chronic anticoagulation - On Xarelto.  Refilling.  Smoking history - Continue to encourage tobacco cessation.       Shared Decision Making/Informed Consent The risks [chest pain, shortness of breath, cardiac arrhythmias, dizziness, blood pressure fluctuations, myocardial infarction, stroke/transient ischemic attack, nausea, vomiting, allergic reaction, radiation exposure, metallic taste sensation and life-threatening complications (estimated to be 1 in 10,000)], benefits (risk stratification, diagnosing coronary artery disease, treatment guidance) and alternatives of a cardiac PET stress test were discussed in detail with Ms. Swaby and she agrees to proceed.    Medication Adjustments/Labs and Tests Ordered: Current medicines are reviewed at length with the patient today.  Concerns regarding medicines are outlined above.  Orders Placed This Encounter  Procedures   NM PET CT CARDIAC PERFUSION MULTI W/ABSOLUTE BLOODFLOW   Lipid panel   Cardiac Stress Test: Informed Consent Details: Physician/Practitioner Attestation; Transcribe to consent form and obtain patient signature   Meds ordered this encounter  Medications   rivaroxaban (XARELTO) 20 MG TABS tablet    Sig: Take 1 tablet (20 mg total) by mouth daily with supper.    Dispense:  30 tablet    Refill:  5    Patient Instructions  Medication Instructions:  The current medical regimen is effective;  continue present plan and medications.  *If you need a refill on your cardiac medications before your next appointment, please call your pharmacy*   Lab Work: Please have blood work today (Lipid)  If you have labs (blood work) drawn today and your tests are  completely normal, you will receive your results only by: [place] (if you have MyChart) OR A paper copy in the mail If you have any lab test that is abnormal or we need to change your treatment, we will call you to review the results.   Testing/Procedures: How to Prepare for Your Cardiac PET/CT Stress Test:  1. Please do not take these medications before your test:   Medications that may interfere with the cardiac pharmacological stress agent (ex. nitrates - including erectile dysfunction medications, isosorbide mononitrate, tamulosin or beta-blockers) the day of the exam. (Erectile dysfunction medication should be held for at least 72 hrs prior to test) Theophylline containing medications for 12 hours. Dipyridamole 48 hours prior to the test. Your remaining medications may be taken with water.  2. Nothing to eat or drink, except water, 3 hours prior to arrival time.   NO caffeine/decaffeinated products, or chocolate 12 hours prior to arrival.  3. NO perfume, cologne or lotion  4. Total time is 1 to 2 hours; you may want to Reynolds reading material for the waiting time.  5. Please report to Radiology at the [facility] Main Entrance 30 minutes early for your test.  [place], [place] [address]  Diabetic Preparation:  Hold oral medications. You may take NPH and Lantus insulin. Do not take Humalog or Humulin R (Regular Insulin) the day of your test. Check blood sugars prior to leaving the house. If able to eat breakfast prior to 3 hour fasting, you may take all medications, including your insulin, Do not worry if you miss your breakfast dose of insulin - start at your next meal.  IF YOU THINK YOU MAY BE PREGNANT, OR ARE NURSING PLEASE INFORM THE TECHNOLOGIST.  In preparation for your appointment, medication and supplies will be purchased.  Appointment availability is limited, so if you need to cancel or reschedule, please call the Radiology Department  at [phone]  24 hours in advance to avoid a cancellation fee of $100.00  What to Expect After you Arrive:  Once you arrive and check in for your appointment, you will be taken to a preparation room within the Radiology Department.  A technologist or Nurse will obtain your medical history, verify that you are correctly prepped for the exam, and explain the procedure.  Afterwards,  an IV will be started in your arm and electrodes will be placed on your skin for EKG monitoring during the stress portion of the exam. Then you will be escorted to the PET/CT scanner.  There, staff will get you positioned on the scanner and obtain a blood pressure and EKG.  During the exam, you will continue to be connected to the EKG and blood pressure machines.  A small, safe amount of a radioactive tracer will be injected in your IV to obtain a series of pictures of your heart along with an injection of a stress agent.    After your Exam:  It is recommended that you eat a meal and drink a caffeinated beverage to counter act any effects of the stress agent.  Drink plenty of fluids for the remainder of the day and urinate frequently for the first couple of hours after the exam.  Your doctor will inform you of your test results within 7-10 business days.  For questions about your test or how to prepare for your test, please call: Marchia Bond, Cardiac Imaging Nurse Navigator  Gordy Clement, Cardiac Imaging Nurse Navigator Office: [phone]  Follow-Up: At [facility], you and your health needs are our priority.  As part of our continuing mission to provide you with exceptional heart care, we have created designated Provider Care Teams.  These Care Teams include your primary Cardiologist (physician) and Advanced Practice Providers (APPs -  Physician Assistants and Nurse Practitioners) who all work together to provide you with the care you need, when you need it.  We recommend signing up for the patient  portal called "MyChart".  Sign up information is provided on this After Visit Summary.  MyChart is used to connect with patients for Virtual Visits (Telemedicine).  Patients are able to view lab/test results, encounter notes, upcoming appointments, etc.  Non-urgent messages can be sent to your provider as well.   To learn more about what you can do with MyChart, go to [email or web address].    Your next appointment:   6 month(s)  Provider:   Dr Candee Furbish    Signed, Candee Furbish, MD  05/17/2022 3:53 PM    [place]

## 2022-05-18 LAB — LIPID PANEL
Chol/HDL Ratio: 3.4 ratio (ref 0.0–4.4)
Cholesterol, Total: 185 mg/dL (ref 100–199)
HDL: 54 mg/dL (ref >39)
LDL Chol Calc (NIH): 92 mg/dL (ref 0–99)
Triglycerides: 233 mg/dL — ABNORMAL HIGH (ref 0–149)
VLDL Cholesterol Cal: 39 mg/dL (ref 5–40)

## 2022-05-21 ENCOUNTER — Telehealth: Payer: Self-pay | Admitting: Cardiology

## 2022-05-21 DIAGNOSIS — E785 Hyperlipidemia, unspecified: Secondary | ICD-10-CM

## 2022-05-21 DIAGNOSIS — Z79899 Other long term (current) drug therapy: Secondary | ICD-10-CM

## 2022-05-21 MED ORDER — ROSUVASTATIN CALCIUM 20 MG PO TABS: 20.0000 mg | ORAL_TABLET | Freq: Every day | ORAL | 3 refills | Status: DC

## 2022-05-21 NOTE — Telephone Encounter (Signed)
° °  Pt is returning call to get lab result °

## 2022-05-21 NOTE — Telephone Encounter (Signed)
Let's change the atorvastatin '20mg'$  over to Crestor '20mg'$  once a day. Repeat fasting lipids in 2 months. LDL < 70 goal. Kellie Furbish, MD  Pt is aware of results and to change from atorvastatin to crestor 20 mg a day.  She will have blood work rechecked 07/23/22.

## 2022-06-22 ENCOUNTER — Other Ambulatory Visit: Payer: Self-pay | Admitting: Gastroenterology

## 2022-07-16 ENCOUNTER — Other Ambulatory Visit: Payer: Self-pay

## 2022-07-16 MED ORDER — METOPROLOL TARTRATE 50 MG PO TABS: ORAL_TABLET | ORAL | 3 refills | Status: DC

## 2022-07-16 NOTE — Telephone Encounter (Signed)
This is a A-Fib clinic pt 

## 2022-07-23 ENCOUNTER — Ambulatory Visit: Payer: Medicare HMO | Attending: Cardiovascular Disease

## 2022-07-23 DIAGNOSIS — E785 Hyperlipidemia, unspecified: Secondary | ICD-10-CM

## 2022-07-23 DIAGNOSIS — Z79899 Other long term (current) drug therapy: Secondary | ICD-10-CM

## 2022-07-23 LAB — LIPID PANEL
Chol/HDL Ratio: 2.9 ratio (ref 0.0–4.4)
Cholesterol, Total: 168 mg/dL (ref 100–199)
HDL: 57 mg/dL (ref >39)
LDL Chol Calc (NIH): 91 mg/dL (ref 0–99)
Triglycerides: 112 mg/dL (ref 0–149)
VLDL Cholesterol Cal: 20 mg/dL (ref 5–40)

## 2022-07-26 ENCOUNTER — Telehealth (HOSPITAL_COMMUNITY): Payer: Self-pay | Admitting: *Deleted

## 2022-07-26 NOTE — Telephone Encounter (Signed)
Reaching out to patient to offer assistance regarding upcoming cardiac imaging study; pt verbalizes understanding of appt date/time, parking situation and where to check in, pre-test NPO status  and verified current allergies; name and call back number provided for further questions should they arise  Urbano Milhouse RN Navigator Cardiac Imaging [facility] [phone] office [phone] cell  Patient aware to avoid caffeine 12 hours prior to her cardiac PET scan. 

## 2022-07-28 ENCOUNTER — Encounter (HOSPITAL_COMMUNITY)
Admission: RE | Admit: 2022-07-28 | Discharge: 2022-07-28 | Disposition: A | Discharge status: Home / Self Care | Payer: Medicare HMO | Source: Ambulatory Visit | Attending: Cardiology | Admitting: Cardiology

## 2022-07-28 DIAGNOSIS — R072 Precordial pain: Secondary | ICD-10-CM

## 2022-07-28 LAB — NM PET CT CARDIAC PERFUSION MULTI W/ABSOLUTE BLOODFLOW
MBFR: 2.35
Nuc Rest EF: 59 %
Nuc Stress EF: 68 %
Rest MBF: 1.08 ml/g/min
Rest Nuclear Isotope Dose: 19.9 mCi
ST Depression (mm): 0 mm
Stress MBF: 2.54 ml/g/min
Stress Nuclear Isotope Dose: 19.9 mCi
TID: 1.12

## 2022-07-28 MED ORDER — RUBIDIUM RB82 GENERATOR (RUBYFILL)
17.0000 | PACK | Freq: Once | INTRAVENOUS | Status: AC
  Administered 2022-07-28: 19.9 via INTRAVENOUS

## 2022-07-28 MED ORDER — RUBIDIUM RB82 GENERATOR (RUBYFILL)
16.0000 | PACK | Freq: Once | INTRAVENOUS | Status: AC
  Administered 2022-07-28: 19.9 via INTRAVENOUS

## 2022-07-28 MED ORDER — REGADENOSON 0.4 MG/5ML IV SOLN
INTRAVENOUS | Status: AC
  Filled 2022-07-28: qty 5

## 2022-07-28 MED ORDER — REGADENOSON 0.4 MG/5ML IV SOLN
0.4000 mg | Freq: Once | INTRAVENOUS | Status: AC
  Administered 2022-07-28: 0.4 mg via INTRAVENOUS

## 2022-08-02 ENCOUNTER — Telehealth: Payer: Self-pay | Admitting: Cardiology

## 2022-08-02 DIAGNOSIS — Z79899 Other long term (current) drug therapy: Secondary | ICD-10-CM

## 2022-08-02 DIAGNOSIS — E785 Hyperlipidemia, unspecified: Secondary | ICD-10-CM

## 2022-08-02 MED ORDER — EZETIMIBE 10 MG PO TABS: 10.0000 mg | ORAL_TABLET | Freq: Every day | ORAL | 3 refills | Status: DC

## 2022-08-02 NOTE — Telephone Encounter (Signed)
Patient is requesting call back to get update on results.  

## 2022-08-02 NOTE — Telephone Encounter (Signed)
LDL 91 on Crestor 20.  Let's add Zetia 10mg  once a day  Continue diet, exercise  Repeat lipids in 2 months  Goal LDL < 70  Donato Schultz, MD   Reviewed tests result and Dr Anne Fu orders with pt.  She reports she took Crestor as ordered for a few weeks but started having loose yellow colored diarrhea for over 1 month.  She discontinue the Crestor and restarted her Atorvastatin.  She will continue the Atorvastatin as she is not having any problems with it.  She will start zetia and have lipid panel in July as scheduled.  She was appreciative of the call and information.

## 2022-08-22 ENCOUNTER — Other Ambulatory Visit: Payer: Self-pay

## 2022-08-22 ENCOUNTER — Emergency Department (HOSPITAL_BASED_OUTPATIENT_CLINIC_OR_DEPARTMENT_OTHER): Payer: Medicare HMO | Admitting: Radiology

## 2022-08-22 ENCOUNTER — Encounter (HOSPITAL_BASED_OUTPATIENT_CLINIC_OR_DEPARTMENT_OTHER): Payer: Self-pay

## 2022-08-22 ENCOUNTER — Emergency Department (HOSPITAL_BASED_OUTPATIENT_CLINIC_OR_DEPARTMENT_OTHER)
Admission: EM | Admit: 2022-08-22 | Discharge: 2022-08-22 | Disposition: A | Discharge status: Home / Self Care | Payer: Medicare HMO | Attending: Emergency Medicine | Admitting: Emergency Medicine

## 2022-08-22 DIAGNOSIS — Z91040 Latex allergy status: Secondary | ICD-10-CM | POA: Diagnosis not present

## 2022-08-22 DIAGNOSIS — M5416 Radiculopathy, lumbar region: Secondary | ICD-10-CM | POA: Diagnosis not present

## 2022-08-22 DIAGNOSIS — M545 Low back pain, unspecified: Secondary | ICD-10-CM | POA: Diagnosis present

## 2022-08-22 DIAGNOSIS — Z7901 Long term (current) use of anticoagulants: Secondary | ICD-10-CM | POA: Diagnosis not present

## 2022-08-22 DIAGNOSIS — Z853 Personal history of malignant neoplasm of breast: Secondary | ICD-10-CM | POA: Diagnosis not present

## 2022-08-22 MED ORDER — CYCLOBENZAPRINE HCL 10 MG PO TABS: 10.0000 mg | ORAL_TABLET | Freq: Two times a day (BID) | ORAL | 0 refills | Status: DC | PRN

## 2022-08-22 MED ORDER — DEXAMETHASONE SODIUM PHOSPHATE 10 MG/ML IJ SOLN
10.0000 mg | Freq: Once | INTRAMUSCULAR | Status: AC
  Administered 2022-08-22: 10 mg via INTRAMUSCULAR
  Filled 2022-08-22: qty 1

## 2022-08-22 MED ORDER — PREDNISONE 10 MG (21) PO TBPK: ORAL_TABLET | Freq: Every day | ORAL | 0 refills | Status: DC

## 2022-08-22 NOTE — ED Triage Notes (Signed)
Pt stated approx 6 months ago, she had a fall from tripping over her flip flops. Pt fell forward while twisting, landing on her right hip and lower back on wooden steps. Pt has not been evaluated previously for same. C/o lower back pain, pain shooting down right leg, unable to rest comfortably. Ambulatory without assistance but c/o pain w/ ROM.

## 2022-08-22 NOTE — Discharge Instructions (Signed)
Please read and follow all provided instructions.  Your diagnoses today include:  1. Lumbar radiculopathy     Tests performed today include: Vital signs - see below for your results today X-rays of your lower back and hip: Shows disc space narrowing in the lower levels of your lumbar spine, no fractures  Medications prescribed:  Prednisone - steroid medicine   It is best to take this medication in the morning to prevent sleeping problems. If you are diabetic, monitor your blood sugar closely and stop taking Prednisone if blood sugar is over 300. Take with food to prevent stomach upset.   Flexeril (cyclobenzaprine) - muscle relaxer medication  DO NOT drive or perform any activities that require you to be awake and alert because this medicine can make you drowsy.   Take any prescribed medications only as directed.  Home care instructions:  Follow any educational materials contained in this packet Please rest, use ice or heat on your back for the next several days Do not lift, push, pull anything more than 10 pounds for the next week  Follow-up instructions: Please follow-up with your primary care provider in the next 1 week for further evaluation of your symptoms.   Return instructions:  SEEK IMMEDIATE MEDICAL ATTENTION IF YOU HAVE: New numbness, tingling, weakness, or problem with the use of your arms or legs Severe back pain not relieved with medications Loss control of your bowels or bladder Increasing pain in any areas of the body (such as chest or abdominal pain) Shortness of breath, dizziness, or fainting.  Worsening nausea (feeling sick to your stomach), vomiting, fever, or sweats Any other emergent concerns regarding your health   Additional Information:  Your vital signs today were: BP (!) 145/57 (BP Location: Right Arm)   Pulse 65   Temp 98.6 F (37 C) (Oral)   Resp 14   Ht 5\' 8"  (1.727 m)   Wt 75.8 kg   SpO2 95%   BMI 25.39 kg/m  If your blood pressure  (BP) was elevated above 135/85 this visit, please have this repeated by your doctor within one month. --------------

## 2022-08-22 NOTE — ED Provider Notes (Signed)
[place] EMERGENCY DEPARTMENT AT [facility] Provider Note   CSN: [id number] Arrival date & time: 08/22/22  0907     History  Chief Complaint  Patient presents with   Back Pain    Kellie Reynolds is a 67 y.o. female.  Patient with history of breast cancer, treated, paroxysmal atrial fibrillation on anticoagulation -- presents for evaluation of lower back pain with radiation into the right hip and down the leg.  This has been present for several months.  She does report a fall about 6 months ago where she landed on her back and hip area.  She has had pain in the lower back and into her hip for "months".  Pain is waking her up at night several times.  She has taken some over-the-counter Tylenol without improvement.  No new falls or injuries but the pain has just become "unbearable" prompting ED visit today.  She has not had pain like this in the past.  Patient denies warning symptoms of back pain including: new fecal incontinence, urinary retention or overflow incontinence, night sweats, unexplained fevers or weight loss, IVDU, recent trauma.  She is able to ambulate without assistance.  No foot drop.        Home Medications Prior to Admission medications   Medication Sig Start Date End Date Taking? Authorizing Provider  acetaminophen (TYLENOL) 500 MG tablet Take 1,500 mg by mouth 3 (three) times daily as needed for headache.    [provider]  alendronate (FOSAMAX) 70 MG tablet Take 70 mg by mouth once a week. 05/04/21   [provider]  Cholecalciferol (VITAMIN D3) 1.25 MG (50000 UT) CAPS Take 1 capsule by mouth once a week. 02/25/21   [provider]  citalopram (CELEXA) 10 MG tablet Take 10 mg by mouth daily. 12/28/18   [provider]  Coenzyme Q10 200 MG capsule Take 200 mg by mouth daily.    [provider]  ezetimibe (ZETIA) 10 MG tablet Take 1 tablet (10 mg total) by mouth daily. 08/02/22   Jake Bathe, MD  ferrous  sulfate 325 (65 FE) MG tablet Take 325 mg by mouth daily. 10/05/21   [provider]  guaiFENesin (MUCINEX) 600 MG 12 hr tablet Take 600 mg by mouth daily as needed (allergies).    [provider]  letrozole (FEMARA) 2.5 MG tablet Take 2.5 mg by mouth daily. 04/24/21   [provider]  losartan (COZAAR) 50 MG tablet Take 50 mg by mouth in the morning and at bedtime. 01/01/20   [provider]  methimazole (TAPAZOLE) 5 MG tablet Take 0.5 tablets (2.5 mg total) by mouth daily. 03/29/22   Shamleffer, Konrad Dolores, MD  metoprolol tartrate (LOPRESSOR) 50 MG tablet TAKE 0.5 TABLETS BY MOUTH 2 TIMES DAILY 07/16/22   Eustace Pen, PA-C  omeprazole (PRILOSEC) 40 MG capsule TAKE 1 CAPSULE BY MOUTH 2 (TWO) TIMES DAILY 20-30 MINUTES BEFORE MEALS. 06/22/22   Jenel Lucks, MD  rivaroxaban (XARELTO) 20 MG TABS tablet Take 1 tablet (20 mg total) by mouth daily with supper. 05/17/22   Jake Bathe, MD  rosuvastatin (CRESTOR) 20 MG tablet Take 1 tablet (20 mg total) by mouth daily. 05/21/22   Jake Bathe, MD  sodium chloride (OCEAN) 0.65 % nasal spray Place 1 spray into the nose 2 (two) times daily as needed. For dryness    [provider]      Allergies    Anastrozole and Latex  Review of Systems   Review of Systems  Physical Exam Updated Vital Signs BP (!) 145/57 (BP Location: Right Arm)   Pulse 65   Temp 98.6 F (37 C) (Oral)   Resp 14   Ht 5\' 8"  (1.727 m)   Wt 75.8 kg   SpO2 95%   BMI 25.39 kg/m  Physical Exam Vitals and nursing note reviewed.  Constitutional:      Appearance: She is well-developed.  HENT:     Head: Normocephalic and atraumatic.  Eyes:     Conjunctiva/sclera: Conjunctivae normal.  Pulmonary:     Effort: Pulmonary effort is normal.  Abdominal:     Palpations: Abdomen is soft.     Tenderness: There is no abdominal tenderness.  Musculoskeletal:        General: Normal range of motion.     Cervical back: Normal range of  motion and neck supple. No tenderness. Normal range of motion.     Thoracic back: No tenderness. Normal range of motion.     Lumbar back: No tenderness. Normal range of motion.     Comments: No step-off noted with palpation of spine.   Skin:    General: Skin is warm and dry.     Findings: No rash.  Neurological:     Mental Status: She is alert.     Sensory: No sensory deficit.     Comments: 5/5 strength in entire lower extremities bilaterally. No sensation deficit.  Patient is able to stand up quickly from a sitting position and walk in the room without difficulty.  She demonstrates how when she wakes up in the morning she has to walk hunched over because of the pain.     ED Results / Procedures / Treatments   Labs (all labs ordered are listed, but only abnormal results are displayed) Labs Reviewed - No data to display  EKG None  Radiology No results found.  Procedures Procedures    Medications Ordered in ED Medications - No data to display  ED Course/ Medical Decision Making/ A&P    Patient seen and examined. History obtained directly from patient and family at bedside.  Labs/EKG: None ordered  Imaging: Ordered x-ray of the lumbar spine and right hip  Medications/Fluids: None ordered, however if x-rays are negative, will consider IM Decadron.  Most recent vital signs reviewed and are as follows: BP (!) 145/57 (BP Location: Right Arm)   Pulse 65   Temp 98.6 F (37 C) (Oral)   Resp 14   Ht 5\' 8"  (1.727 m)   Wt 75.8 kg   SpO2 95%   BMI 25.39 kg/m   Initial impression: Right-sided radicular lower back pain.  Given her history of breast cancer and age, feel that plain films and ED are warranted.  Discussed the limited utility of these images overall with patient and family.  Discussed that she will likely need additional follow-up for complete treatment.  If imaging negative will start on steroid and muscle relaxer, anti-inflammatories.  Patient is not a diabetic  and does not have a history of kidney dysfunction.  11:06 AM patient reassessed.  She continues to look comfortable.  X-rays reviewed including lumbar spine films and hip films, agree no fractures, agree signs of degenerative disc disease lower lumbar spine.  Plan: Will give IM dose of Decadron here, prescribe prednisone taper for home as well as Flexeril.  She will have to avoid NSAIDs due to concurrent anticoagulation.  Home treatment plan: Patient was counseled on back  pain precautions and advised to do activity as tolerated but avoid strenuous activity and do not lift, push, or pull heavy objects more than 10 pounds for the next week.  Patient counseled to use ice or heat on back as needed for pain and spasm.    Medications prescribed: Flexeril for muscle pain and spasm. Patient counseled on proper use of muscle relaxant medication.  They were requested not to drink alcohol, drive any vehicle, or do any dangerous activities while taking this medication due to potential drowsiness and unintended.  Patient verbalized understanding.  Return instructions discussed with patient: Urged to return with worsening severe pain, loss of bowel or bladder control, trouble walking, development of weakness in the legs, or with any other concerns.  Follow-up instructions discussed with patient: Patient urged to follow-up with PCP if pain does not improve with treatment and rest or if pain becomes recurrent.                              Medical Decision Making Amount and/or Complexity of Data Reviewed Radiology: ordered.   Patient with back pain with radicular features. No neurological deficits. Patient is ambulatory.  No significant tenderness on exam.  Plain films demonstrate lower lumbar spine degenerative disc disease, otherwise no fracture.  No warning symptoms of back pain including: fecal incontinence, urinary retention or overflow incontinence, night sweats, waking from sleep with back pain,  unexplained fevers or weight loss, h/o cancer, IVDU, recent trauma. No concern for cauda equina, epidural abscess, or other emergent cause of back pain. Conservative measures such as rest, ice/heat and pain medicine indicated with PCP follow-up if no improvement with conservative management.          Final Clinical Impression(s) / ED Diagnoses Final diagnoses:  Lumbar radiculopathy    Rx / DC Orders ED Discharge Orders          Ordered    predniSONE (STERAPRED UNI-PAK 21 TAB) 10 MG (21) TBPK tablet  Daily        08/22/22 1046    cyclobenzaprine (FLEXERIL) 10 MG tablet  2 times daily PRN        08/22/22 1046              [name] Crigler, PA-C 08/22/22 1108    Terrilee Files, MD 08/22/22 [phone]

## 2022-09-24 ENCOUNTER — Encounter: Payer: Self-pay | Admitting: Internal Medicine

## 2022-09-24 ENCOUNTER — Ambulatory Visit: Payer: Medicare HMO | Admitting: Internal Medicine

## 2022-09-24 VITALS — BP 130/76 | HR 67 | Wt 169.8 lb

## 2022-09-24 DIAGNOSIS — E0590 Thyrotoxicosis, unspecified without thyrotoxic crisis or storm: Secondary | ICD-10-CM

## 2022-09-24 DIAGNOSIS — E042 Nontoxic multinodular goiter: Secondary | ICD-10-CM

## 2022-09-24 LAB — TSH: TSH: 0.54 u[IU]/mL (ref 0.35–5.50)

## 2022-09-24 LAB — T4, FREE: Free T4: 0.58 ng/dL — ABNORMAL LOW (ref 0.60–1.60)

## 2022-09-24 MED ORDER — METHIMAZOLE 5 MG PO TABS: 2.5000 mg | ORAL_TABLET | ORAL | 3 refills | Status: DC

## 2022-09-24 NOTE — Progress Notes (Signed)
Name: Kellie Reynolds  MRN/ DOB: [id number], Apr 26, 1955    Age/ Sex: 67 y.o., female    PCP: Alveria Apley, NP (Inactive)   Reason for Endocrinology Evaluation: MNG     Date of Initial Endocrinology Evaluation: 06/18/2021    HPI: Kellie Reynolds is a 67 y.o. female with a past medical history of MNG, A.Fib, osteoporosis and DM, Hx of breast Ca ( Left lumpectomy . The patient presented for initial endocrinology clinic visit on 06/18/2021 for consultative assistance with her Subclinical Hyperthyroidism and MNG.   Patient states that she has had diarrhea since 2020 which was started following radiation treatment for left breast cancer.  During evaluation for causes of diarrhea the patient has been noted to have multinodular goiter   No FH of thyroid disease    In review of her chart she had a Hx of thyroid uptake and scan in 2008 with a 24-hr uptake at 12% . Thyroid uptake and scan 03/2021 showed subtle areas of decrease tracer uptake at the lateral aspect of mid and inferior right love.    On her initial visit to our clinic she had an TSH 0.31 uIU/mL with normal T4 and T3.  We started methimazole 2.5 mg daily (06/2021)  No prior use of Amiodarone   SUBJECTIVE:    Today (09/24/22):  Kellie Reynolds is here for follow-up of multinodular goiter and hyperthyroidism  Weight continues to fluctuate  Denies local neck swelling  Continues with  occasional  chronic palpitations, A. Fib stable ( S/P cardioversion ) Anxiety stable Continues with chronic diarrhea Denies tremors  Denies fever   No biotin intake   Methimazole 5 mg, half a tablet daily     HISTORY:  Past Medical History:  Past Medical History:  Diagnosis Date   Arthritis    right knee   Breast CA (HCC) 2022   left   Family history of adverse reaction to anesthesia 35 yrs ago   daughter had malignant hyperthermia reaction with acentine, daughter has had surgeries since then and did welll   Fibrocystic  breast disease    GERD (gastroesophageal reflux disease)    HTN (hypertension)    Hypercholesteremia    IDA (iron deficiency anemia)    MVP (mitral valve prolapse)    Paroxysmal atrial fibrillation (HCC)    a. s/p PVI   Skin cancer    Sleep apnea    no longer use CPAP   Past Surgical History:  Past Surgical History:  Procedure Laterality Date   ATRIAL FIBRILLATION ABLATION N/A 04/18/2012   PVI by Allred   BREAST LUMPECTOMY  2022   COLONOSCOPY WITH PROPOFOL N/A 11/24/2015   Procedure: COLONOSCOPY WITH PROPOFOL;  Surgeon: Charolett Bumpers, MD;  Location: [facility] ENDOSCOPY;  Service: Endoscopy;  Laterality: N/A;   EYE SURGERY Bilateral    lasix eye surgery both eyes   MENISECTOMY     Right knee   NASAL POLYP SURGERY  1990   NASAL SINUS SURGERY  1990   TEE WITHOUT CARDIOVERSION  04/17/2012   Procedure: TRANSESOPHAGEAL ECHOCARDIOGRAM (TEE);  Surgeon: Corky Crafts, MD;  Location: [facility] ENDOSCOPY;  Service: Cardiovascular;  Laterality: N/A;  pre ablation   TUBAL LIGATION      Social History:  reports that she has been smoking cigarettes. She has a 100 pack-year smoking history. She has never used smokeless tobacco. She reports current alcohol use. She reports that she does not use drugs. Family History: family history includes  CAD in her father; Diabetes in her mother; Heart attack in her father; Hyperlipidemia in her father; Hypertension in her father; Leukemia in her maternal grandfather; Liver cancer in her maternal grandmother.   HOME MEDICATIONS: Allergies as of 09/24/2022       Reactions   Anastrozole Other (See Comments)   unknown   Latex Itching, Rash, Other (See Comments)   Elastic in underwear        Medication List        Accurate as of September 24, 2022  3:59 PM. If you have any questions, ask your nurse or doctor.          STOP taking these medications    predniSONE 10 MG (21) Tbpk tablet Commonly known as: STERAPRED UNI-PAK 21 TAB Stopped by: Johnney Ou  Maysoon Lozada       TAKE these medications    acetaminophen 500 MG tablet Commonly known as: TYLENOL Take 1,500 mg by mouth 3 (three) times daily as needed for headache.   alendronate 70 MG tablet Commonly known as: FOSAMAX Take 70 mg by mouth once a week.   citalopram 10 MG tablet Commonly known as: CELEXA Take 10 mg by mouth daily.   Coenzyme Q10 200 MG capsule Take 200 mg by mouth daily.   cyclobenzaprine 10 MG tablet Commonly known as: FLEXERIL Take 1 tablet (10 mg total) by mouth 2 (two) times daily as needed for muscle spasms.   ezetimibe 10 MG tablet Commonly known as: ZETIA Take 1 tablet (10 mg total) by mouth daily.   ferrous sulfate 325 (65 FE) MG tablet Take 325 mg by mouth daily.   guaiFENesin 600 MG 12 hr tablet Commonly known as: MUCINEX Take 600 mg by mouth daily as needed (allergies).   letrozole 2.5 MG tablet Commonly known as: FEMARA Take 2.5 mg by mouth daily.   losartan 50 MG tablet Commonly known as: COZAAR Take 50 mg by mouth in the morning and at bedtime.   methimazole 5 MG tablet Commonly known as: TAPAZOLE Take 0.5 tablets (2.5 mg total) by mouth daily.   metoprolol tartrate 50 MG tablet Commonly known as: LOPRESSOR TAKE 0.5 TABLETS BY MOUTH 2 TIMES DAILY   omeprazole 40 MG capsule Commonly known as: PRILOSEC TAKE 1 CAPSULE BY MOUTH 2 (TWO) TIMES DAILY 20-30 MINUTES BEFORE MEALS.   rivaroxaban 20 MG Tabs tablet Commonly known as: Xarelto Take 1 tablet (20 mg total) by mouth daily with supper.   rosuvastatin 20 MG tablet Commonly known as: CRESTOR Take 1 tablet (20 mg total) by mouth daily. What changed: how much to take   sodium chloride 0.65 % nasal spray Commonly known as: OCEAN Place 1 spray into the nose 2 (two) times daily as needed. For dryness   Vitamin D3 1.25 MG (50000 UT) Caps Take 1 capsule by mouth once a week.          REVIEW OF SYSTEMS: A comprehensive ROS was conducted with the patient and is negative  except as per HPI   OBJECTIVE:  VS: BP 130/76 (BP Location: Left Arm, Patient Position: Sitting, Cuff Size: Large)   Pulse 67   Wt 169 lb 12.8 oz (77 kg)   SpO2 98%   BMI 25.82 kg/m    Wt Readings from Last 3 Encounters:  09/24/22 169 lb 12.8 oz (77 kg)  08/22/22 167 lb (75.8 kg)  05/17/22 178 lb 6.4 oz (80.9 kg)     EXAM: General: Pt appears well and is in NAD  Neck: General: Supple without adenopathy. Thyroid: Thyroid size normal.  No goiter or nodules appreciated.   Lungs: Clear with good BS bilat with no rales, rhonchi, or wheezes  Heart: Auscultation: RRR.  Abdomen: Normoactive bowel sounds, soft, nontender, without masses or organomegaly palpable  Extremities:  BL LE: No pretibial edema normal ROM and strength.  Mental Status: Judgment, insight: Intact Orientation: Oriented to time, place, and person Mood and affect: No depression, anxiety, or agitation     DATA REVIEWED:  Latest Reference Range & Units 09/24/22 10:07  TSH 0.35 - 5.50 uIU/mL 0.54  T4,Free(Direct) 0.60 - 1.60 ng/dL 1.61 (L)      Latest Reference Range & Units 03/26/22 10:00  TRAB <=2.00 IU/L 1.15     Thyroid ultrasound 04/05/2022  FINDINGS: Parenchymal Echotexture: Mildly heterogenous   Isthmus: 0.5 cm   Right lobe: 6.9 x 2.1 x 2.6 cm   Left lobe: 6.3 x 2.6 x 3.1 cm   _________________________________________________________   Estimated total number of nodules >/= 1 cm: 3   Number of spongiform nodules >/=  2 cm not described below (TR1): 0   Number of mixed cystic and solid nodules >/= 1.5 cm not described below (TR2): 0   _________________________________________________________   Nodule 1: 0.9 cm cystic right superior thyroid nodule does not meet criteria for imaging surveillance or FNA.   _________________________________________________________   Nodule # 2:   Prior biopsy: No   Location: Right; mid   Maximum size: 1.1 cm; Other 2 dimensions: 1.1 x 0.8 cm,  previously, 1.1 x 1.1 x 0.8 cm   Composition: solid/almost completely solid (2)   Echogenicity: hypoechoic (2)   Shape: not taller-than-wide (0)   Margins: ill-defined (0)   Echogenic foci: macrocalcifications (1)   ACR TI-RADS total points: 5.   ACR TI-RADS risk category:  TR4 (4-6 points).   Significant change in size (>/= 20% in two dimensions and minimal increase of 2 mm): No   Change in features: No   Change in ACR TI-RADS risk category: No   ACR TI-RADS recommendations:   *Given size (>/= 1 - 1.4 cm) and appearance, a follow-up ultrasound in 1 year should be considered based on TI-RADS criteria.   _________________________________________________________   Nodule # 3:   Prior biopsy: No   Location: Right; Mid   Maximum size: 2.2 cm; Other 2 dimensions: 1.6 x 1.0 cm, previously, 1.9 x 1.4 x 0.9 cm   Composition: solid/almost completely solid (2)   Echogenicity: isoechoic (1)   Shape: not taller-than-wide (0)   Margins: smooth (0)   Echogenic foci: none (0)   ACR TI-RADS total points: 3.   ACR TI-RADS risk category:  TR3 (3 points).   Significant change in size (>/= 20% in two dimensions and minimal increase of 2 mm): No   Change in features: No   Change in ACR TI-RADS risk category: No   ACR TI-RADS recommendations:   *Given size (>/= 1.5 - 2.4 cm) and appearance, a follow-up ultrasound in 1 year should be considered based on TI-RADS criteria.   _________________________________________________________   Nodule 4: 1.2 x 1.0 x 1.0 cm hypoechoic nodule in the inferior right thyroid lobe was clearly spongiform on the prior examination it appears more solid on the current exam due to loss of fluid. This does not require further imaging follow-up.   IMPRESSION: 1. Nodule 2 (TI-RADS 4), measuring 1.1 x 1.1 x 0.8 cm, located in the mid right thyroid lobe has not significantly changed in size  since 03/25/2021 and still meets criteria for  imaging follow-up. 2. Nodule 3 (TI-RADS 3), measuring 2.2 x 1.6 x 1.0 cm, previously 1.9 x 1.4 x 0.9 cm, located in the mid right thyroid lobe has not significantly changed in size since prior examination and still meets criteria for imaging follow-up. This nodule appears more solid on the current examination, however this is due to loss of fluid.     Thyroid Uptake 03/26/2021    FINDINGS: Homogeneous tracer distribution LEFT thyroid lobe.   Subtle areas of decreased tracer uptake at lateral aspect of mid and inferior RIGHT thyroid lobe; these correspond to nodules identified on ultrasound.   No additional areas of increased or decreased tracer uptake seen.   4 hour I-123 uptake = 10.1% (normal 5-20%)   24 hour I-123 uptake = 25.5% (normal 10-30%)   IMPRESSION: Normal 4 hour and 24 hour radio iodine uptakes.   Subtle cold nodules at the lateral aspect of the mid and inferior RIGHT thyroid lobe, corresponding to nodules identified by thyroid ultrasound; please see recommendations of recent thyroid ultrasound.     ASSESSMENT/PLAN/RECOMMENDATIONS:   MNG  -No local neck symptoms -Thyroid uptake and scan shows subtle cold nodules at the lateral aspect of the mid and the inferior right thyroid lobe but based on thyroid ultrasound none of these nodules met criteria for FNA  -Repeat ultrasound 03/2022 shows stability of the nodules   2. Subclinical Hyperthyroidism :   -Patient is clinically euthyroid -We have opted to treat with methimazole given personal history of atrial fibrillation and the importance of keeping thyroid levels at normal level to prevent any cardiac arrhythmias -TFTs show normal TSH but free T4 is low, will decrease methimazole as below   Medication Decrease methimazole 5 mg, half a tablet Monday through Saturday, none on Sundays    F/U in 6 months    Signed electronically by: Lyndle Herrlich, MD  [facility] Endocrinology  [facility] [address] [place] [place], [place] [address] Phone: [phone] FAX: [phone]   CC: Alveria Apley, NP (Inactive) No address on file Phone: None Fax: None   Return to Endocrinology clinic as below: Future Appointments  Date Time Provider Department Center  10/07/2022  8:00 AM [facility]  10/13/2022  3:00 PM [facility] DX DEXA 1 [facility]  11/01/2022 10:15 AM [facility] None  11/01/2022 10:40 AM Jaci Standard, MD [facility] None  03/29/2023  9:50 AM Pharaoh Pio, Konrad Dolores, MD [facility] None

## 2022-10-06 ENCOUNTER — Ambulatory Visit: Payer: Medicare HMO | Attending: Cardiology

## 2022-10-06 DIAGNOSIS — Z79899 Other long term (current) drug therapy: Secondary | ICD-10-CM

## 2022-10-06 DIAGNOSIS — E785 Hyperlipidemia, unspecified: Secondary | ICD-10-CM

## 2022-10-07 ENCOUNTER — Ambulatory Visit: Payer: Medicare HMO

## 2022-10-07 LAB — LIPID PANEL
HDL: 61 mg/dL (ref >39)
LDL Chol Calc (NIH): 95 mg/dL (ref 0–99)
Triglycerides: 76 mg/dL (ref 0–149)
VLDL Cholesterol Cal: 14 mg/dL (ref 5–40)

## 2022-10-08 ENCOUNTER — Telehealth: Payer: Self-pay | Admitting: *Deleted

## 2022-10-08 DIAGNOSIS — Z79899 Other long term (current) drug therapy: Secondary | ICD-10-CM

## 2022-10-08 DIAGNOSIS — E785 Hyperlipidemia, unspecified: Secondary | ICD-10-CM

## 2022-10-08 NOTE — Telephone Encounter (Signed)
  LDL [phone] Please refer to lipid clinic to discuss PCSK9-I such as Repatha Thanks  Pt has reviewed results and Dr Anne Fu orders via MyChart.  Order placed for referral to Lipid Clinic.  Pt will be contacted to be scheduled.

## 2022-10-13 ENCOUNTER — Ambulatory Visit
Admission: RE | Admit: 2022-10-13 | Discharge: 2022-10-13 | Disposition: A | Discharge status: Home / Self Care | Payer: Medicare HMO | Source: Ambulatory Visit | Attending: Physician Assistant | Admitting: Physician Assistant

## 2022-10-13 DIAGNOSIS — Z853 Personal history of malignant neoplasm of breast: Secondary | ICD-10-CM

## 2022-10-29 ENCOUNTER — Telehealth: Payer: Self-pay | Admitting: Pharmacist

## 2022-10-29 ENCOUNTER — Telehealth: Payer: Self-pay | Admitting: Pharmacy Technician

## 2022-10-29 ENCOUNTER — Encounter: Payer: Self-pay | Admitting: Student

## 2022-10-29 ENCOUNTER — Ambulatory Visit: Payer: Medicare HMO | Attending: Internal Medicine | Admitting: Student

## 2022-10-29 ENCOUNTER — Other Ambulatory Visit (HOSPITAL_COMMUNITY): Payer: Self-pay

## 2022-10-29 VITALS — BP 128/60 | HR 63 | Ht 66.0 in | Wt 167.4 lb

## 2022-10-29 DIAGNOSIS — E7800 Pure hypercholesterolemia, unspecified: Secondary | ICD-10-CM

## 2022-10-29 DIAGNOSIS — R7303 Prediabetes: Secondary | ICD-10-CM | POA: Diagnosis not present

## 2022-10-29 DIAGNOSIS — E785 Hyperlipidemia, unspecified: Secondary | ICD-10-CM

## 2022-10-29 MED ORDER — ROSUVASTATIN CALCIUM 10 MG PO TABS: 10.0000 mg | ORAL_TABLET | Freq: Every day | ORAL | 3 refills | Status: DC

## 2022-10-29 NOTE — Progress Notes (Signed)
Patient ID: Kellie Reynolds                 DOB: 12/01/1955                    MRN: [id number]      HPI: Kellie Reynolds is a 67 y.o. female patient referred to lipid clinic by Dr.Skains. PMH is significant for elevated CAC, CAD, family hx of premature ASCVD, breast cancer, GERD, HTN, Dyslipidemia, PAF, OSA not on CPAP Patient presented today for lipid clinic. Reports she takes atorvastatin 10 mg and rosuvastatin 10 mg together. Advised to stop atorvastatin 10 mg. She can't tolerate ezetimibe sue toe nausea and diarrhea. Also Crestor 20 mg was causing diarrhea but she tolerates 10 mg dose without any trouble. She smokes 1.5 pack per day and does not have any exercise schedule. Patient was prediabetic last year and would like to get her A1c checked today.  Reviewed options for lowering LDL cholesterol, including, PCSK-9 inhibitors, bempedoic acid and inclisiran.  Discussed mechanisms of action, dosing, side effects and potential decreases in LDL cholesterol.  Also reviewed cost information and potential options for patient assistance.  Current Medications: Crestor 10 mg daily   Intolerances: Lipitor 10 mg dand 20 mg , Zetia  10 mg daily and Crestor 20 mg - diarrhea nausea  Risk Factors: elevated CAC- 143, CAD, family hx of premature ASCVD,  LDL goal: <70 mg/dl TG <409 mg/dl   Diet:  drink- diet soda, flavored water Breakfast- greek yogurt with fruits  Lunch: salad or baked potatoes or [place]  Eats out 3 days a week  Dinner: does not eat dinner as she eats lunch late  Exercise:  have 1 years old grand child stays busy with her whole day.   Family History:  Relation Problem Comments  Mother (Deceased) Diabetes     Father - 67 MI (Deceased) CAD   Heart attack   Hyperlipidemia   Hypertension     Maternal Grandmother (Deceased) Liver cancer     Maternal Grandfather (Deceased) Leukemia      Social History:  Alcohol: 4 beers per day suggest to cut down to 1 per day   Smoking: 1.5  pack per day (30 cig per day for 55 years) do not want to quit  Recreational drug use: none  Labs: Lipid Panel     Component Value Date/Time   CHOL 170 10/06/2022 0728   TRIG 76 10/06/2022 0728   HDL 61 10/06/2022 0728   CHOLHDL 2.8 10/06/2022 0728   CHOLHDL 4 04/16/2014 0950   VLDL 20.4 04/16/2014 0950   LDLCALC 95 10/06/2022 0728   LABVLDL 14 10/06/2022 0728    Past Medical History:  Diagnosis Date   Arthritis    right knee   Breast CA (HCC) 2022   left   Family history of adverse reaction to anesthesia 35 yrs ago   daughter had malignant hyperthermia reaction with acentine, daughter has had surgeries since then and did welll   Fibrocystic breast disease    GERD (gastroesophageal reflux disease)    HTN (hypertension)    Hypercholesteremia    IDA (iron deficiency anemia)    MVP (mitral valve prolapse)    Paroxysmal atrial fibrillation (HCC)    a. s/p PVI   Skin cancer    Sleep apnea    no longer use CPAP    Current Outpatient Medications on File Prior to Visit  Medication Sig Dispense Refill   acetaminophen (TYLENOL) 500  MG tablet Take 1,500 mg by mouth 3 (three) times daily as needed for headache.     alendronate (FOSAMAX) 70 MG tablet Take 70 mg by mouth once a week.     citalopram (CELEXA) 10 MG tablet Take 10 mg by mouth daily.     Coenzyme Q10 200 MG capsule Take 200 mg by mouth daily.     cyclobenzaprine (FLEXERIL) 10 MG tablet Take 1 tablet (10 mg total) by mouth 2 (two) times daily as needed for muscle spasms. 20 tablet 0   ferrous sulfate 325 (65 FE) MG tablet Take 325 mg by mouth daily.     guaiFENesin (MUCINEX) 600 MG 12 hr tablet Take 600 mg by mouth daily as needed (allergies).     letrozole (FEMARA) 2.5 MG tablet Take 2.5 mg by mouth daily.     losartan (COZAAR) 50 MG tablet Take 50 mg by mouth in the morning and at bedtime.     methimazole (TAPAZOLE) 5 MG tablet Take 0.5 tablets (2.5 mg total) by mouth as directed. Half a tablet Monday through  Saturday, none on Sundays 36 tablet 3   metoprolol tartrate (LOPRESSOR) 50 MG tablet TAKE 0.5 TABLETS BY MOUTH 2 TIMES DAILY 90 tablet 3   omeprazole (PRILOSEC) 40 MG capsule TAKE 1 CAPSULE BY MOUTH 2 (TWO) TIMES DAILY 20-30 MINUTES BEFORE MEALS. 180 capsule 1   rivaroxaban (XARELTO) 20 MG TABS tablet Take 1 tablet (20 mg total) by mouth daily with supper. 30 tablet 5   sodium chloride (OCEAN) 0.65 % nasal spray Place 1 spray into the nose 2 (two) times daily as needed. For dryness     No current facility-administered medications on file prior to visit.    Allergies  Allergen Reactions   Anastrozole Other (See Comments)    unknown   Latex Itching, Rash and Other (See Comments)    Elastic in underwear     Assessment/Plan:  1. Hyperlipidemia -  Problem  Prediabetes  High Cholesterol   Current Medications: Crestor 10 mg daily   Intolerances: Lipitor 10 mg dand 20 mg , Zetia  10 mg daily and Crestor 20 mg - diarrhea nausea  Risk Factors: elevated CAC- 143, CAD, family hx of premature ASCVD,  LDL goal: <70 mg/dl TG <621 mg/dl     High cholesterol Assessment and Plan:  LDL goal: < 70 mg/dl last LDLc 95 mg/dl (30/86/5784) Tolerates moderate intensity statins well  Intolerance to high intensity statins and Zetia 10 mg   Accidentally taking Lipitor 10 mg daily and Crestor 10 mg daily together  Discussed next potential options (PCSK-9 inhibitors, bempedoic acid and inclisiran); cost, dosing efficacy, side effects  Given allergy to latex will apply for PA for Praluent  Continue taking current medications (Crestor 10 mg daily) stop taking atorvastatin 10 mg daily   Reiterated importance of regular exercise, heart healthy diet , alcohol moderation and smoking cession  Lipid lab due in 2-3 months after starting PCSK9i   Prediabetes Assessment and Plan:  Patient does not recall when was last A1c done  Not on any medication for pre-diabetes  Will get updated A1c  today     Thank  you,  Carmela Hurt, Pharm.D [facility] A Division of [facility] [address] [place], [place] [address]  Phone: [phone]; Fax: [phone]

## 2022-10-29 NOTE — Patient Instructions (Signed)
Your Results:             Your most recent labs Goal  Total Cholesterol 170 < 200  Triglycerides 76 < 150  HDL (happy/good cholesterol) 61 > 40  LDL (lousy/bad cholesterol 95 < 70   Medication changes: We will start the process to get Praluent  covered by your insurance.  Once the prior authorization is complete, we will call you to let you know and confirm pharmacy information.    Praluent is a cholesterol medication that improved your body's ability to get rid of "bad cholesterol" known as LDL. It can lower your LDL up to 60%. It is an injection that is given under the skin every 2 weeks. The most common side effects of Praluent include runny nose, symptoms of the common cold, rarely flu or flu-like symptoms, back/muscle pain in about 3-4% of the patients, and redness, pain, or bruising at the injection site.    Lab orders: We want to repeat labs after 2-3 months.  We will send you a lab order to remind you once we get closer to that time.

## 2022-10-29 NOTE — Assessment & Plan Note (Addendum)
Assessment and Plan:  LDL goal: < 70 mg/dl last LDLc 95 mg/dl (16/12/9602) Tolerates moderate intensity statins well  Intolerance to high intensity statins and Zetia 10 mg   Accidentally taking Lipitor 10 mg daily and Crestor 10 mg daily together  Discussed next potential options (PCSK-9 inhibitors, bempedoic acid and inclisiran); cost, dosing efficacy, side effects  Given allergy to latex will apply for PA for Praluent  Continue taking current medications (Crestor 10 mg daily) stop taking atorvastatin 10 mg daily   Reiterated importance of regular exercise, heart healthy diet , alcohol moderation and smoking cession  Lipid lab due in 2-3 months after starting [facility]

## 2022-10-29 NOTE — Telephone Encounter (Signed)
Pharmacy Patient Advocate Encounter   Received notification from Pt Calls Messages that prior authorization for Praluent 75MG /ML auto-injectors is required/requested.   Insurance verification completed.   The patient is insured through  [employer]  .   Per test claim: PA required; PA submitted to [employer] medicare via CoverMyMeds Key/confirmation #/EOC [id number] Status is pending

## 2022-10-29 NOTE — Assessment & Plan Note (Signed)
Assessment and Plan:  Patient does not recall when was last A1c done  Not on any medication for pre-diabetes  Will get updated A1c  today

## 2022-10-30 LAB — HEMOGLOBIN A1C
Est. average glucose Bld gHb Est-mCnc: 128 mg/dL
Hgb A1c MFr Bld: 6.1 % — ABNORMAL HIGH (ref 4.8–5.6)

## 2022-11-01 ENCOUNTER — Inpatient Hospital Stay: Payer: Medicare HMO | Admitting: Hematology and Oncology

## 2022-11-01 ENCOUNTER — Other Ambulatory Visit (HOSPITAL_COMMUNITY): Payer: Self-pay

## 2022-11-01 ENCOUNTER — Inpatient Hospital Stay: Payer: Medicare HMO | Attending: Hematology and Oncology

## 2022-11-01 ENCOUNTER — Other Ambulatory Visit: Payer: Self-pay | Admitting: Hematology and Oncology

## 2022-11-01 VITALS — BP 132/66 | HR 60 | Temp 98.0°F | Resp 19 | Wt 167.8 lb

## 2022-11-01 DIAGNOSIS — F17210 Nicotine dependence, cigarettes, uncomplicated: Secondary | ICD-10-CM | POA: Diagnosis not present

## 2022-11-01 DIAGNOSIS — D500 Iron deficiency anemia secondary to blood loss (chronic): Secondary | ICD-10-CM

## 2022-11-01 DIAGNOSIS — Z853 Personal history of malignant neoplasm of breast: Secondary | ICD-10-CM | POA: Diagnosis not present

## 2022-11-01 DIAGNOSIS — Z79811 Long term (current) use of aromatase inhibitors: Secondary | ICD-10-CM | POA: Insufficient documentation

## 2022-11-01 DIAGNOSIS — Z923 Personal history of irradiation: Secondary | ICD-10-CM | POA: Insufficient documentation

## 2022-11-01 DIAGNOSIS — Z806 Family history of leukemia: Secondary | ICD-10-CM | POA: Insufficient documentation

## 2022-11-01 DIAGNOSIS — Z170 Estrogen receptor positive status [ER+]: Secondary | ICD-10-CM | POA: Insufficient documentation

## 2022-11-01 DIAGNOSIS — Z808 Family history of malignant neoplasm of other organs or systems: Secondary | ICD-10-CM | POA: Diagnosis not present

## 2022-11-01 DIAGNOSIS — C50912 Malignant neoplasm of unspecified site of left female breast: Secondary | ICD-10-CM | POA: Diagnosis not present

## 2022-11-01 DIAGNOSIS — Z79899 Other long term (current) drug therapy: Secondary | ICD-10-CM | POA: Insufficient documentation

## 2022-11-01 DIAGNOSIS — D509 Iron deficiency anemia, unspecified: Secondary | ICD-10-CM | POA: Insufficient documentation

## 2022-11-01 LAB — CBC WITH DIFFERENTIAL (CANCER CENTER ONLY)
Abs Immature Granulocytes: 0.02 10*3/uL (ref 0.00–0.07)
Basophils Absolute: 0 10*3/uL (ref 0.0–0.1)
Basophils Relative: 0 %
Eosinophils Absolute: 0.1 10*3/uL (ref 0.0–0.5)
Eosinophils Relative: 2 %
HCT: 36 % (ref 36.0–46.0)
Hemoglobin: 12.1 g/dL (ref 12.0–15.0)
Immature Granulocytes: 0 %
Lymphocytes Relative: 17 %
Lymphs Abs: 1.1 10*3/uL (ref 0.7–4.0)
MCH: 30.1 pg (ref 26.0–34.0)
MCHC: 33.6 g/dL (ref 30.0–36.0)
MCV: 89.6 fL (ref 80.0–100.0)
Monocytes Absolute: 0.5 10*3/uL (ref 0.1–1.0)
Monocytes Relative: 8 %
Neutro Abs: 4.8 10*3/uL (ref 1.7–7.7)
Neutrophils Relative %: 73 %
Platelet Count: 190 10*3/uL (ref 150–400)
RBC: 4.02 MIL/uL (ref 3.87–5.11)
RDW: 12.9 % (ref 11.5–15.5)
WBC Count: 6.6 10*3/uL (ref 4.0–10.5)
nRBC: 0 % (ref 0.0–0.2)

## 2022-11-01 LAB — RETIC PANEL
Immature Retic Fract: 18.4 % — ABNORMAL HIGH (ref 2.3–15.9)
RBC.: 3.91 MIL/uL (ref 3.87–5.11)
Retic Count, Absolute: 57.5 10*3/uL (ref 19.0–186.0)
Retic Ct Pct: 1.5 % (ref 0.4–3.1)
Reticulocyte Hemoglobin: 29.4 pg (ref >27.9)

## 2022-11-01 LAB — CMP (CANCER CENTER ONLY)
ALT: 11 U/L (ref 0–44)
AST: 16 U/L (ref 15–41)
Albumin: 3.8 g/dL (ref 3.5–5.0)
Alkaline Phosphatase: 76 U/L (ref 38–126)
Anion gap: 6 (ref 5–15)
BUN: 5 mg/dL — ABNORMAL LOW (ref 8–23)
CO2: 31 mmol/L (ref 22–32)
Calcium: 9 mg/dL (ref 8.9–10.3)
Chloride: 106 mmol/L (ref 98–111)
Creatinine: 0.6 mg/dL (ref 0.44–1.00)
GFR, Estimated: >60 mL/min (ref >60)
Glucose, Bld: 120 mg/dL — ABNORMAL HIGH (ref 70–99)
Potassium: 4.4 mmol/L (ref 3.5–5.1)
Sodium: 143 mmol/L (ref 135–145)
Total Bilirubin: 0.5 mg/dL (ref 0.3–1.2)
Total Protein: 6.5 g/dL (ref 6.5–8.1)

## 2022-11-01 LAB — IRON AND IRON BINDING CAPACITY (CC-WL,HP ONLY)
Iron: 26 ug/dL — ABNORMAL LOW (ref 28–170)
Saturation Ratios: 8 % — ABNORMAL LOW (ref 10.4–31.8)
TIBC: 336 ug/dL (ref 250–450)
UIBC: 310 ug/dL (ref 148–442)

## 2022-11-01 LAB — FERRITIN: Ferritin: 8 ng/mL — ABNORMAL LOW (ref 11–307)

## 2022-11-01 NOTE — Progress Notes (Signed)
[facility] [phone]   [phone]  PROGRESS NOTE  Patient Care Team: Alveria Apley, NP (Inactive) as PCP - General (Family Medicine) Jake Bathe, MD as PCP - Cardiology (Cardiology)  Hematological/Oncological History -10/03/2021: Hgb 7.8. MCV 75.4 -10/23/2021: Hgb 8.6, MCV 75.1, Iron 25, Ferritin 15.6, Saturation 5.1%.  -11/18/2021: Establish care with [facility] Hematology with Dr. Jeanie Sewer and Georga Kaufmann PA-C  CHIEF COMPLAINTS/PURPOSE OF CONSULTATION:  Iron deficiency anemia H/O breast cancer  HISTORY OF PRESENTING ILLNESS:  Kellie Reynolds 67 y.o. female returns for a follow up for iron deficiency anemia and history of breast cancer. She is unaccompanied for this visit.   On exam today, Kellie Reynolds reports that she has been "not bad" the interim since her last visit.  Her energy is "so-so".  She reports that she could fall asleep now.  She is not any food today because she thought she had to fast for her labs.  She notes that she has not been having any trouble with bleeding, bruising, or dark stools.  She is taking Xarelto 20 mg p.o. daily.  She recently stopped her iron pills because her PCP told her that they were not of much use anymore.  She also reports that she has issues with diarrhea fluctuant with constipation.  She is not having any runny nose, sore throat, or cough.  She reports she does not eat any meat but will try to eat more iron rich foods.  She denies fevers, chills, sweats, shortness of breath, chest pain or cough. She has no other complaints. Rest of the 10 point ROS is below.   MEDICAL HISTORY:  Past Medical History:  Diagnosis Date   Arthritis    right knee   Breast CA (HCC) 2022   left   Family history of adverse reaction to anesthesia 35 yrs ago   daughter had malignant hyperthermia reaction with acentine, daughter has had surgeries since then and did welll   Fibrocystic breast disease    GERD (gastroesophageal  reflux disease)    HTN (hypertension)    Hypercholesteremia    IDA (iron deficiency anemia)    MVP (mitral valve prolapse)    Paroxysmal atrial fibrillation (HCC)    a. s/p PVI   Skin cancer    Sleep apnea    no longer use CPAP    SURGICAL HISTORY: Past Surgical History:  Procedure Laterality Date   ATRIAL FIBRILLATION ABLATION N/A 04/18/2012   PVI by Allred   BREAST LUMPECTOMY  2022   COLONOSCOPY WITH PROPOFOL N/A 11/24/2015   Procedure: COLONOSCOPY WITH PROPOFOL;  Surgeon: Charolett Bumpers, MD;  Location: [facility] ENDOSCOPY;  Service: Endoscopy;  Laterality: N/A;   EYE SURGERY Bilateral    lasix eye surgery both eyes   MENISECTOMY     Right knee   NASAL POLYP SURGERY  1990   NASAL SINUS SURGERY  1990   TEE WITHOUT CARDIOVERSION  04/17/2012   Procedure: TRANSESOPHAGEAL ECHOCARDIOGRAM (TEE);  Surgeon: Corky Crafts, MD;  Location: [facility] ENDOSCOPY;  Service: Cardiovascular;  Laterality: N/A;  pre ablation   TUBAL LIGATION      SOCIAL HISTORY: Social History   Socioeconomic History   Marital status: Divorced    Spouse name: Not on file   Number of children: 1   Years of education: Not on file   Highest education level: Not on file  Occupational History   Not on file  Tobacco Use   Smoking status: Every Day  Current packs/day: 2.00    Average packs/day: 2.0 packs/day for 50.0 years (100.0 ttl pk-yrs)    Types: Cigarettes   Smokeless tobacco: Never  Vaping Use   Vaping status: Never Used  Substance and Sexual Activity   Alcohol use: Yes    Comment: 4 beers oper day, regular sized beers   Drug use: No   Sexual activity: Not on file  Other Topics Concern   Not on file  Social History Narrative   Not on file   Social Determinants of Health   Financial Resource Strain: Not on file  Food Insecurity: Not on file  Transportation Needs: Not on file  Physical Activity: Not on file  Stress: Not on file  Social Connections: Not on file  Intimate Partner Violence:  Not on file    FAMILY HISTORY: Family History  Problem Relation Age of Onset   Diabetes Mother    Hyperlipidemia Father    Hypertension Father    CAD Father    Heart attack Father    Liver cancer Maternal Grandmother    Leukemia Maternal Grandfather    Colon cancer Neg Hx    Esophageal cancer Neg Hx    Stomach cancer Neg Hx    Rectal cancer Neg Hx     ALLERGIES:  is allergic to anastrozole and latex.  MEDICATIONS:  Current Outpatient Medications  Medication Sig Dispense Refill   acetaminophen (TYLENOL) 500 MG tablet Take 1,500 mg by mouth 3 (three) times daily as needed for headache.     alendronate (FOSAMAX) 70 MG tablet Take 70 mg by mouth once a week.     citalopram (CELEXA) 10 MG tablet Take 10 mg by mouth daily.     Coenzyme Q10 200 MG capsule Take 200 mg by mouth daily.     cyclobenzaprine (FLEXERIL) 10 MG tablet Take 1 tablet (10 mg total) by mouth 2 (two) times daily as needed for muscle spasms. 20 tablet 0   ferrous sulfate 325 (65 FE) MG tablet Take 325 mg by mouth daily.     guaiFENesin (MUCINEX) 600 MG 12 hr tablet Take 600 mg by mouth daily as needed (allergies).     letrozole (FEMARA) 2.5 MG tablet Take 2.5 mg by mouth daily.     losartan (COZAAR) 50 MG tablet Take 50 mg by mouth in the morning and at bedtime.     methimazole (TAPAZOLE) 5 MG tablet Take 0.5 tablets (2.5 mg total) by mouth as directed. Half a tablet Monday through Saturday, none on Sundays 36 tablet 3   metoprolol tartrate (LOPRESSOR) 50 MG tablet TAKE 0.5 TABLETS BY MOUTH 2 TIMES DAILY 90 tablet 3   omeprazole (PRILOSEC) 40 MG capsule TAKE 1 CAPSULE BY MOUTH 2 (TWO) TIMES DAILY 20-30 MINUTES BEFORE MEALS. 180 capsule 1   rivaroxaban (XARELTO) 20 MG TABS tablet Take 1 tablet (20 mg total) by mouth daily with supper. 30 tablet 5   rosuvastatin (CRESTOR) 10 MG tablet Take 1 tablet (10 mg total) by mouth daily. 90 tablet 3   sodium chloride (OCEAN) 0.65 % nasal spray Place 1 spray into the nose 2 (two)  times daily as needed. For dryness     No current facility-administered medications for this visit.    REVIEW OF SYSTEMS:   Constitutional: ( - ) fevers, ( - )  chills , ( - ) night sweats Eyes: ( - ) blurriness of vision, ( - ) double vision, ( - ) watery eyes Ears, nose, mouth, throat, and  face: ( - ) mucositis, ( - ) sore throat Respiratory: ( - ) cough, ( + ) dyspnea, ( - ) wheezes Cardiovascular: ( - ) palpitation, ( - ) chest discomfort, ( - ) lower extremity swelling Gastrointestinal:  ( + ) nausea, ( - ) heartburn, ( + ) change in bowel habits Skin: ( - ) abnormal skin rashes Lymphatics: ( - ) new lymphadenopathy, ( - ) easy bruising Neurological: ( - ) numbness, ( - ) tingling, ( - ) new weaknesses Behavioral/Psych: ( - ) mood change, ( - ) new changes  All other systems were reviewed with the patient and are negative.  PHYSICAL EXAMINATION: ECOG PERFORMANCE STATUS: 1 - Symptomatic but completely ambulatory  Vitals:   11/01/22 1043  BP: 132/66  Pulse: 60  Resp: 19  Temp: 98 F (36.7 C)  SpO2: 97%    Filed Weights   11/01/22 1043  Weight: 167 lb 12.8 oz (76.1 kg)     GENERAL: well appearing female in NAD  SKIN: skin color, texture, turgor are normal, no rashes or significant lesions EYES: conjunctiva are pink and non-injected, sclera clear.  LUNGS: clear to auscultation and percussion with normal breathing effort HEART: regular rate & rhythm and no murmurs and no lower extremity edema Musculoskeletal: no cyanosis of digits and no clubbing  PSYCH: alert & oriented x 3, fluent speech NEURO: no focal motor/sensory deficits BREAT: firmness in left upper breast near lumpectomy site. No palpable masses appreciated. No axillary lymphadenopathy.   LABORATORY DATA:  I have reviewed the data as listed    Latest Ref Rng & Units 11/01/2022   10:30 AM 04/30/2022    9:55 AM 03/01/2022    4:12 PM  CBC  WBC 4.0 - 10.5 K/uL 6.6  7.3  10.5   Hemoglobin 12.0 - 15.0 g/dL  78.2  95.6  21.3   Hematocrit 36.0 - 46.0 % 36.0  39.7  40.6   Platelets 150 - 400 K/uL 190  170  238.0        Latest Ref Rng & Units 11/01/2022   10:30 AM 04/30/2022    9:55 AM 01/26/2022   12:34 PM  CMP  Glucose 70 - 99 mg/dL 086  578  469   BUN 8 - 23 mg/dL 5  7  6    Creatinine 0.44 - 1.00 mg/dL 6.29  5.28  4.13   Sodium 135 - 145 mmol/L 143  141  137   Potassium 3.5 - 5.1 mmol/L 4.4  4.0  3.9   Chloride 98 - 111 mmol/L 106  104  101   CO2 22 - 32 mmol/L 31  32  29   Calcium 8.9 - 10.3 mg/dL 9.0  9.2  9.2   Total Protein 6.5 - 8.1 g/dL 6.5  6.7  6.9   Total Bilirubin 0.3 - 1.2 mg/dL 0.5  0.6  0.3   Alkaline Phos 38 - 126 U/L 76  64  85   AST 15 - 41 U/L 16  13  14    ALT 0 - 44 U/L 11  11  10      RADIOGRAPHIC STUDIES: I have personally reviewed the radiological images as listed and agreed with the findings in the report. DG Bone Density  Result Date: 10/13/2022 EXAM: DUAL X-RAY ABSORPTIOMETRY (DXA) FOR BONE MINERAL DENSITY IMPRESSION: Referring Physician:  Briant Cedar Your patient completed a bone mineral density test using GE Lunar iDXA system (analysis version: 16). Technologist:   lmn PATIENT: Name:  Kellie Reynolds, Kellie Reynolds Patient ID: [id number] Birth Date: [birth year] Height: 66.6 in. Sex: Female Measured: 10/13/2022 Weight: 165.8 lbs. Indications: Breast Cancer History, Caucasian, Celexa, Depression, Estrogen Deficient, Hyperthyroid (242.9), Letrozole, Methimazole, Omeprazole, Postmenopausal, Tobacco User (Current Smoker) Fractures: Right Foot Treatments: Fosamax ASSESSMENT: The BMD measured at Femur Neck Right is 0.828 g/cm2 with a T-score of -1.5. This patient is considered osteopenic/low bone mass according to World Health Organization [facility]) criteria. World Health Organization FRAX assessment of absolute fracture risk is not calculated for this patient because the patient has a history of bone building therapy. The quality of the exam is good. The lumbar spine was excluded due to  degenerative changes. Site Region Measured Date Measured Age YA BMD Significant CHANGE T-score DualFemur Neck Right 10/13/2022 66.3 -1.5 0.828 g/cm2 DualFemur Total Mean 10/13/2022 66.3 -0.8 0.901 g/cm2 Right Forearm Radius 33% 10/13/2022 66.3 0.8 0.946 g/cm2 World Health Organization [facility]) criteria for post-menopausal, Caucasian Women: Normal       T-score at or above -1 SD Osteopenia   T-score between -1 and -2.5 SD Osteoporosis T-score at or below -2.5 SD RECOMMENDATION: 1. All patients should optimize calcium and vitamin D intake. 2. Consider FDA-approved medical therapies in postmenopausal women and men aged 73 years and older, based on the following: a. A hip or vertebral (clinical or morphometric) fracture. b. T-score = -2.5 at the femoral neck or spine after appropriate evaluation to exclude secondary causes. c. Low bone mass (T-score between -1.0 and -2.5 at the femoral neck or spine) and a 10-year probability of a hip fracture = 3% or a 10-year probability of a major osteoporosis-related fracture = 20% based on the [place]-adapted WHO algorithm. d. Clinician judgment and/or patient preferences may indicate treatment for people with 10-year fracture probabilities above or below these levels. FOLLOW-UP: Patients with diagnosis of osteoporosis or at high risk for fracture should have regular bone mineral density tests.? Patients eligible for Medicare are allowed routine testing every 2 years.? The testing frequency can be increased to one year for patients who have rapidly progressing disease, are receiving or discontinuing medical therapy to restore bone mass, or have additional risk factors. I have reviewed this study and agree with the findings. [facility] Electronically Signed   By: Baird Lyons M.D.   On: 10/13/2022 15:43    ASSESSMENT & PLAN LARSON KLEINER is a 67 y.o. returns for a follow up for iron deficiency anemia and history of left breast cancer.   #Iron deficiency anemia 2/2  chronic blood loss: --Currently on ferrous sulfate 325 mg once a day with a source of vitamin C --Patient follows vegetarian diet so provided list of iron rich foods to incorporate into diet.  --Under the care of Dr. Tomasa Rand (GI) and underwent EGD/colonoscopy from 06/05/2021 showed no evidence of active bleeding. Plan to repeat colonoscopy with ablation of cecal AVM if patient exhibits evidence of rebleeding --Underwent capsular EGD on 11/09/2021 that was incomplete due to capsule retention in the stomach. Small bowel enteroscopy was done on 12/07/2021 that showed no evidence of significant pathology in the entire duodenum/proximal jejunum. --Labs today show anemia has resolved with Hgb 12.1, hemoglobin 6.6, MCV 89.6, and platelets of 190.  Iron panel shows no evidence of deficiency.  --No need for IV iron at this time.  #H/o left breast cancer --Diagnosed in June 2022 and treated at [facility] --Underwent lumpectomy on 07/29/2020. 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 --Not enough tissue for Oncotype testing.  --Received adjust radiation to the left breast 10/15/2020-11/11/2020.  --Patient requested to transfer care to [facility] due to convenience of travel.  --Status post adjuvant endocrine therapy with anastrozole 1 mg daily, 11/24/2020, discontinued October 2022 secondary to myalgias and arthralgias.  --Started adjuvant endocrine therapy with letrozole 2.5 mg daily on 02/03/2021. Plan for total of 7 years of therapy.  --Last mammogram was on 05/13/2022 that showed no evidence of malignancies. Next mammogram due in Feb/March 2025. --Due to firmness palpated in left upper breast today, we will request breast US along with mammogram --Last bone density study was in July 2024 and findings consistent with osteopenia. Current on regimen for bone health including vitamin D/calcium and foasamax.  Follow  up: --6 months: labs and follow up visit   No orders of the defined types were placed in this encounter.   All questions were answered. The patient knows to call the clinic with any problems, questions or concerns.  I have spent a total of 30 minutes minutes of face-to-face and non-face-to-face time, preparing to see the patient, performing a medically appropriate examination, counseling and educating the patient, ordering tests, communicating with other health care professionals,  and care coordination.   Ulysees Barns, MD Department of Hematology/Oncology [facility] at [facility] Phone: [phone] Pager: [phone] Email: [email or web address]

## 2022-11-01 NOTE — Telephone Encounter (Signed)
Kellie Reynolds from [place] [facility] is requesting a callback at [phone] regarding some clinical questions she has for  Praluent 75MG /ML auto-injectors. Please advise

## 2022-11-01 NOTE — Telephone Encounter (Signed)
Called [employer] at [phone]- They will provide update by 11/04/22 by fax

## 2022-11-02 ENCOUNTER — Other Ambulatory Visit (HOSPITAL_COMMUNITY): Payer: Self-pay

## 2022-11-02 MED ORDER — PRALUENT 75 MG/ML ~~LOC~~ SOAJ: 75.0000 mg | SUBCUTANEOUS | 3 refills | Status: DC

## 2022-11-02 NOTE — Telephone Encounter (Signed)
Called to inform about PA approvals and follow up lab. N/A. MyChart massage sent.

## 2022-11-02 NOTE — Telephone Encounter (Signed)
Pharmacy Patient Advocate Encounter  Received notification from [employer] that Prior Authorization for Praluent 75MG /ML auto-injectors has been APPROVED from 11/01/22 to 03/15/23. Ran test claim, Copay is $129.16. This test claim was processed through [facility]- copay amounts may vary at other pharmacies due to pharmacy/plan contracts, or as the patient moves through the different stages of their insurance plan.   PA #/Case ID/Reference #:  [id number]

## 2022-11-02 NOTE — Addendum Note (Signed)
Addended by: Tylene Fantasia on: 11/02/2022 10:19 AM   Modules accepted: Orders

## 2022-11-02 NOTE — Telephone Encounter (Signed)
PA approved for Praluent 

## 2022-11-05 MED ORDER — EZETIMIBE 10 MG PO TABS: 10.0000 mg | ORAL_TABLET | Freq: Every day | ORAL | 3 refills | Status: DC

## 2022-11-05 NOTE — Addendum Note (Signed)
Addended by: Tylene Fantasia on: 11/05/2022 02:48 PM   Modules accepted: Orders

## 2022-11-05 NOTE — Telephone Encounter (Signed)
Patient states, Praluent is cost prohibitive, will try Zetia for now and in 3 months  if LDLc elevated will consider going on Praluent. Hopefully we will have grant reopen in 3 months so we can make it more affordable for patient.

## 2022-11-15 ENCOUNTER — Other Ambulatory Visit: Payer: Self-pay | Admitting: Cardiology

## 2022-11-16 NOTE — Telephone Encounter (Signed)
Prescription refill request for Xarelto received.  Indication:afib Last office visit:8/24 Weight:76.1  kg Age:67 Scr:0.6  8/24 CrCl:110.8  ml/min  Prescription refilled

## 2022-12-17 ENCOUNTER — Ambulatory Visit: Payer: Medicare HMO | Admitting: Podiatry

## 2022-12-17 DIAGNOSIS — L600 Ingrowing nail: Secondary | ICD-10-CM

## 2022-12-17 DIAGNOSIS — L601 Onycholysis: Secondary | ICD-10-CM

## 2022-12-17 NOTE — Progress Notes (Signed)
Subjective:  Patient ID: Kellie Reynolds, female    DOB: [birth year],  MRN: [id number]  Chief Complaint  Patient presents with   Nail Problem    Toe nail fungus to right hallux. Nail is lifted from nail bed. Painful. Patient stated she had pus to the nail. She has been soaking her foot with warm salt water.     67 y.o. female presents with concern for nail fungus and injury to the right hallux nail.  She is not sure what happened but she did have some common injury to the right great toe where the nail lifted up.  Having significant pain on palpation or with any pressure on the nail.   Past Medical History:  Diagnosis Date   Arthritis    right knee   Breast CA (HCC) 2022   left   Family history of adverse reaction to anesthesia 35 yrs ago   daughter had malignant hyperthermia reaction with acentine, daughter has had surgeries since then and did welll   Fibrocystic breast disease    GERD (gastroesophageal reflux disease)    HTN (hypertension)    Hypercholesteremia    IDA (iron deficiency anemia)    MVP (mitral valve prolapse)    Paroxysmal atrial fibrillation (HCC)    a. s/p PVI   Skin cancer    Sleep apnea    no longer use CPAP    Allergies  Allergen Reactions   Anastrozole Other (See Comments)    unknown   Latex Itching, Rash and Other (See Comments)    Elastic in underwear     ROS: Negative except as per HPI above  Objective:  General: AAO x3, NAD  Dermatological: Thickness discoloration and subungual hematoma present on the right hallux nail with pain on palpation onycholysis is present with the nail lifting off the nailbed.  Normal concern for ingrown nail as well as trauma to the nail causing onycholysis  Vascular:  Dorsalis Pedis artery and Posterior Tibial artery pedal pulses are 2/4 bilateral.  Capillary fill time < 3 sec to all digits.   Neruologic: Grossly intact via light touch bilateral. Protective threshold intact to all sites bilateral.    Musculoskeletal: Pain right hallux nail  Gait: Unassisted, Nonantalgic.   No images are attached to the encounter.   Assessment:   1. Ingrown nail of great toe of right foot   2. Onycholysis      Plan:  Patient was evaluated and treated and all questions answered.  # Ingrown nail and onycholysis secondary to trauma -Recommend total right hallux matrixectomy patient was agreeable Ingrown Nail, right -Patient elects to proceed with minor surgery to remove ingrown toenail today. Consent reviewed and signed by patient. -Ingrown nail excised. See procedure note. -Educated on post-procedure care including soaking. Written instructions provided and reviewed. -Patient to follow up in 2 weeks for nail check.  Procedure: Excision of Ingrown Toenail Location: Right 1st toe total nail  Anesthesia: Lidocaine 1% plain; 1.5 mL and Marcaine 0.5% plain; 1.5 mL, digital block. Skin Prep: Betadine. Dressing: Silvadene; telfa; dry, sterile, compression dressing. Technique: Following skin prep, the toe was exsanguinated and a tourniquet was secured at the base of the toe. The affected nail border was freed, split with a nail splitter, and excised. Chemical matrixectomy was then performed with NaOH. The tourniquet was then removed and sterile dressing applied. Disposition: Patient tolerated procedure well. Patient to return in 2 weeks for follow-up.    Return in about 2 weeks (around 12/31/2022) for nail  check.          Corinna Gab, DPM [facility] / [facility]

## 2022-12-17 NOTE — Patient Instructions (Signed)

## 2023-01-07 ENCOUNTER — Ambulatory Visit: Payer: Medicare HMO | Admitting: Podiatry

## 2023-01-17 ENCOUNTER — Encounter: Payer: Self-pay | Admitting: Student

## 2023-01-27 ENCOUNTER — Ambulatory Visit: Payer: Medicare HMO | Admitting: Podiatry

## 2023-01-27 DIAGNOSIS — L600 Ingrowing nail: Secondary | ICD-10-CM | POA: Diagnosis not present

## 2023-01-27 NOTE — Progress Notes (Signed)
Subjective: Kellie KNOFF is a 67 y.o.  female returns to office today for follow up evaluation after having right Hallux total nail ingrown removal withl matrixectomy approximately 2 weeks ago. Patient has been soaking using vinegar and applying topical antibiotic covered with bandaid daily. Patient denies fevers, chills, nausea, vomiting. Denies any calf pain, chest pain, SOB.   Objective:  Vitals: Reviewed  General: Well developed, nourished, in no acute distress, alert and oriented x3   Dermatology: Skin is warm, dry and supple bilateral. right hallux nail bed appears to be clean, dry, with mild granular tissue and surrounding scab. There is no surrounding erythema, edema, drainage/purulence. The remaining nails appear unremarkable at this time. There are no other lesions or other signs of infection present.  Neurovascular status: Intact. No lower extremity swelling; No pain with calf compression bilateral.  Musculoskeletal: Decreased tenderness to palpation of the right hallux nail fold(s). Muscular strength within normal limits bilateral.   Assesement and Plan: S/p matrixectomy to the right hallux nail total, doing well.   -Continue soaking in epsom salts twice a day followed by antibiotic ointment and a band-aid. Can leave uncovered at night. Continue this until completely healed.  -If the area has not healed in 2 weeks, call the office for follow-up appointment, or sooner if any problems arise.  -Monitor for any signs/symptoms of infection. Call the office immediately if any occur or go directly to the emergency room. Call with any questions/concerns.        Corinna Gab, DPM [facility] / [facility]                   01/27/2023

## 2023-02-07 ENCOUNTER — Telehealth: Payer: Self-pay | Admitting: Pharmacist

## 2023-02-07 MED ORDER — PRALUENT 75 MG/ML ~~LOC~~ SOAJ: 75.0000 mg | SUBCUTANEOUS | 3 refills | Status: DC

## 2023-02-07 NOTE — Telephone Encounter (Signed)
[facility] Grant re-open so enrolled patient in [place] to make Praluent affordable.  Call to inform pt, N/A , will send MyChart msg

## 2023-03-01 ENCOUNTER — Other Ambulatory Visit: Payer: Self-pay | Admitting: Gastroenterology

## 2023-03-29 ENCOUNTER — Encounter: Payer: Self-pay | Admitting: Internal Medicine

## 2023-03-29 ENCOUNTER — Ambulatory Visit: Payer: Medicare HMO | Admitting: Internal Medicine

## 2023-03-29 VITALS — BP 118/70 | HR 55 | Ht 66.0 in | Wt 152.0 lb

## 2023-03-29 DIAGNOSIS — E042 Nontoxic multinodular goiter: Secondary | ICD-10-CM | POA: Diagnosis not present

## 2023-03-29 DIAGNOSIS — E0590 Thyrotoxicosis, unspecified without thyrotoxic crisis or storm: Secondary | ICD-10-CM | POA: Diagnosis not present

## 2023-03-29 NOTE — Progress Notes (Signed)
 Name: Kellie Reynolds  MRN/ DOB: [id number], 01-Aug-1955    Age/ Sex: 68 y.o., female    PCP: Silvano Angeline FALCON, NP   Reason for Endocrinology Evaluation: MNG     Date of Initial Endocrinology Evaluation: 06/18/2021    HPI: Kellie Reynolds is a 68 y.o. female with a past medical history of MNG, A.Fib, osteoporosis and DM, Hx of breast Ca ( Left lumpectomy . The patient presented for initial endocrinology clinic visit on 06/18/2021 for consultative assistance with her Subclinical Hyperthyroidism and MNG.   Patient states that she has had diarrhea since 2020 which was started following radiation treatment for left breast cancer.  During evaluation for causes of diarrhea the patient has been noted to have multinodular goiter   No FH of thyroid  disease    In review of her chart she had a Hx of thyroid  uptake and scan in 2008 with a 24-hr uptake at 12% . Thyroid  uptake and scan 03/2021 showed subtle areas of decrease tracer uptake at the lateral aspect of mid and inferior right lobe   On her initial visit to our clinic she had an TSH 0.31 uIU/mL with normal T4 and T3.  We started methimazole  2.5 mg daily (06/2021)  No prior use of Amiodarone   SUBJECTIVE:    Today (03/29/23):  Kellie Reynolds is here for follow-up of multinodular goiter and hyperthyroidism   Patient has been noted weight loss Denies local neck swelling  Stable  occasional  chronic palpitations, A. Fib stable ( S/P cardioversion ) Denies recent diarrhea  Denies tremors  No biotin intake   Methimazole  5 mg, half a tablet Monday through Saturday, none on Sundays   HISTORY:  Past Medical History:  Past Medical History:  Diagnosis Date   Arthritis    right knee   Breast CA (HCC) 2022   left   Family history of adverse reaction to anesthesia 35 yrs ago   daughter had malignant hyperthermia reaction with acentine, daughter has had surgeries since then and did welll   Fibrocystic breast disease    GERD  (gastroesophageal reflux disease)    HTN (hypertension)    Hypercholesteremia    IDA (iron deficiency anemia)    MVP (mitral valve prolapse)    Paroxysmal atrial fibrillation (HCC)    a. s/p PVI   Skin cancer    Sleep apnea    no longer use CPAP   Past Surgical History:  Past Surgical History:  Procedure Laterality Date   ATRIAL FIBRILLATION ABLATION N/A 04/18/2012   PVI by Allred   BREAST LUMPECTOMY  2022   COLONOSCOPY WITH PROPOFOL  N/A 11/24/2015   Procedure: COLONOSCOPY WITH PROPOFOL ;  Surgeon: Gladis MARLA Louder, MD;  Location: [facility] ENDOSCOPY;  Service: Endoscopy;  Laterality: N/A;   EYE SURGERY Bilateral    lasix eye surgery both eyes   MENISECTOMY     Right knee   NASAL POLYP SURGERY  1990   NASAL SINUS SURGERY  1990   TEE WITHOUT CARDIOVERSION  04/17/2012   Procedure: TRANSESOPHAGEAL ECHOCARDIOGRAM (TEE);  Surgeon: Candyce CANDIE Reek, MD;  Location: [facility] ENDOSCOPY;  Service: Cardiovascular;  Laterality: N/A;  pre ablation   TUBAL LIGATION      Social History:  reports that she has been smoking cigarettes. She has a 100 pack-year smoking history. She has never used smokeless tobacco. She reports current alcohol use. She reports that she does not use drugs. Family History: family history includes CAD in her father;  Diabetes in her mother; Heart attack in her father; Hyperlipidemia in her father; Hypertension in her father; Leukemia in her maternal grandfather; Liver cancer in her maternal grandmother.   HOME MEDICATIONS: Allergies as of 03/29/2023       Reactions   Anastrozole Other (See Comments)   unknown   Latex Itching, Rash, Other (See Comments)   Elastic in underwear        Medication List        Accurate as of March 29, 2023  9:58 AM. If you have any questions, ask your nurse or doctor.          acetaminophen  500 MG tablet Commonly known as: TYLENOL  Take 1,500 mg by mouth 3 (three) times daily as needed for headache.   alendronate 70 MG  tablet Commonly known as: FOSAMAX Take 70 mg by mouth once a week.   atorvastatin  20 MG tablet Commonly known as: LIPITOR Take 20 mg by mouth daily.   citalopram 10 MG tablet Commonly known as: CELEXA Take 10 mg by mouth daily.   Coenzyme Q10 200 MG capsule Take 200 mg by mouth daily.   cyclobenzaprine  10 MG tablet Commonly known as: FLEXERIL  Take 1 tablet (10 mg total) by mouth 2 (two) times daily as needed for muscle spasms.   ezetimibe  10 MG tablet Commonly known as: Zetia  Take 1 tablet (10 mg total) by mouth daily.   ferrous sulfate 325 (65 FE) MG tablet Take 325 mg by mouth daily.   flecainide  100 MG tablet Commonly known as: TAMBOCOR  Take 50 mg by mouth 2 (two) times daily.   guaiFENesin 600 MG 12 hr tablet Commonly known as: MUCINEX Take 600 mg by mouth daily as needed (allergies).   letrozole  2.5 MG tablet Commonly known as: FEMARA  Take 2.5 mg by mouth daily.   losartan 50 MG tablet Commonly known as: COZAAR Take 50 mg by mouth in the morning and at bedtime.   methimazole  5 MG tablet Commonly known as: TAPAZOLE  Take 0.5 tablets (2.5 mg total) by mouth as directed. Half a tablet Monday through Saturday, none on Sundays   metoprolol  tartrate 50 MG tablet Commonly known as: LOPRESSOR  TAKE 0.5 TABLETS BY MOUTH 2 TIMES DAILY   omeprazole  40 MG capsule Commonly known as: PRILOSEC TAKE 1 CAPSULE BY MOUTH 2 (TWO) TIMES DAILY 20-30 MINUTES BEFORE MEALS.   Praluent  75 MG/ML Soaj Generic drug: Alirocumab  Inject 1 mL (75 mg total) into the skin every 14 (fourteen) days.   rosuvastatin  10 MG tablet Commonly known as: CRESTOR  Take 1 tablet (10 mg total) by mouth daily.   sodium chloride  0.65 % nasal spray Commonly known as: OCEAN Place 1 spray into the nose 2 (two) times daily as needed. For dryness   Xarelto  20 MG Tabs tablet Generic drug: rivaroxaban  TAKE 1 TABLET BY MOUTH DAILY WITH SUPPER.          REVIEW OF SYSTEMS: A comprehensive ROS was  conducted with the patient and is negative except as per HPI   OBJECTIVE:  VS: BP 118/70 (BP Location: Left Arm, Patient Position: Sitting, Cuff Size: Normal)   Pulse (!) 55   Ht 5' 6 (1.676 m)   Wt 152 lb (68.9 kg)   SpO2 96%   BMI 24.53 kg/m    Wt Readings from Last 3 Encounters:  03/29/23 152 lb (68.9 kg)  11/01/22 167 lb 12.8 oz (76.1 kg)  10/29/22 167 lb 6.4 oz (75.9 kg)     EXAM: General: Pt appears  well and is in NAD  Neck: General: Supple without adenopathy. Thyroid : Thyroid  size normal.  No goiter or nodules appreciated.   Lungs: Clear with good BS bilat with no rales, rhonchi, or wheezes  Heart: Auscultation: RRR.  Extremities:  BL LE: No pretibial edema   Mental Status: Judgment, insight: Intact Orientation: Oriented to time, place, and person Mood and affect: No depression, anxiety, or agitation     DATA REVIEWED:   Latest Reference Range & Units 03/29/23 10:25  TSH 0.40 - 4.50 mIU/L 0.23 (L)  Triiodothyronine,Free,Serum 2.3 - 4.2 pg/mL 3.4  T4,Free(Direct) 0.8 - 1.8 ng/dL 1.0  (L): Data is abnormally low    Latest Reference Range & Units 03/26/22 10:00  TRAB <=2.00 IU/L 1.15     Thyroid  ultrasound 04/05/2022  FINDINGS: Parenchymal Echotexture: Mildly heterogenous   Isthmus: 0.5 cm   Right lobe: 6.9 x 2.1 x 2.6 cm   Left lobe: 6.3 x 2.6 x 3.1 cm   _________________________________________________________   Estimated total number of nodules >/= 1 cm: 3   Number of spongiform nodules >/=  2 cm not described below (TR1): 0   Number of mixed cystic and solid nodules >/= 1.5 cm not described below (TR2): 0   _________________________________________________________   Nodule 1: 0.9 cm cystic right superior thyroid  nodule does not meet criteria for imaging surveillance or FNA.   _________________________________________________________   Nodule # 2:   Prior biopsy: No   Location: Right; mid   Maximum size: 1.1 cm; Other 2  dimensions: 1.1 x 0.8 cm, previously, 1.1 x 1.1 x 0.8 cm   Composition: solid/almost completely solid (2)   Echogenicity: hypoechoic (2)   Shape: not taller-than-wide (0)   Margins: ill-defined (0)   Echogenic foci: macrocalcifications (1)   ACR TI-RADS total points: 5.   ACR TI-RADS risk category:  TR4 (4-6 points).   Significant change in size (>/= 20% in two dimensions and minimal increase of 2 mm): No   Change in features: No   Change in ACR TI-RADS risk category: No   ACR TI-RADS recommendations:   *Given size (>/= 1 - 1.4 cm) and appearance, a follow-up ultrasound in 1 year should be considered based on TI-RADS criteria.   _________________________________________________________   Nodule # 3:   Prior biopsy: No   Location: Right; Mid   Maximum size: 2.2 cm; Other 2 dimensions: 1.6 x 1.0 cm, previously, 1.9 x 1.4 x 0.9 cm   Composition: solid/almost completely solid (2)   Echogenicity: isoechoic (1)   Shape: not taller-than-wide (0)   Margins: smooth (0)   Echogenic foci: none (0)   ACR TI-RADS total points: 3.   ACR TI-RADS risk category:  TR3 (3 points).   Significant change in size (>/= 20% in two dimensions and minimal increase of 2 mm): No   Change in features: No   Change in ACR TI-RADS risk category: No   ACR TI-RADS recommendations:   *Given size (>/= 1.5 - 2.4 cm) and appearance, a follow-up ultrasound in 1 year should be considered based on TI-RADS criteria.   _________________________________________________________   Nodule 4: 1.2 x 1.0 x 1.0 cm hypoechoic nodule in the inferior right thyroid  lobe was clearly spongiform on the prior examination it appears more solid on the current exam due to loss of fluid. This does not require further imaging follow-up.   IMPRESSION: 1. Nodule 2 (TI-RADS 4), measuring 1.1 x 1.1 x 0.8 cm, located in the mid right thyroid  lobe has not significantly  changed in size since 03/25/2021 and still  meets criteria for imaging follow-up. 2. Nodule 3 (TI-RADS 3), measuring 2.2 x 1.6 x 1.0 cm, previously 1.9 x 1.4 x 0.9 cm, located in the mid right thyroid  lobe has not significantly changed in size since prior examination and still meets criteria for imaging follow-up. This nodule appears more solid on the current examination, however this is due to loss of fluid.     Thyroid  Uptake 03/26/2021    FINDINGS: Homogeneous tracer distribution LEFT thyroid  lobe.   Subtle areas of decreased tracer uptake at lateral aspect of mid and inferior RIGHT thyroid  lobe; these correspond to nodules identified on ultrasound.   No additional areas of increased or decreased tracer uptake seen.   4 hour I-123 uptake = 10.1% (normal 5-20%)   24 hour I-123 uptake = 25.5% (normal 10-30%)   IMPRESSION: Normal 4 hour and 24 hour radio iodine uptakes.   Subtle cold nodules at the lateral aspect of the mid and inferior RIGHT thyroid  lobe, corresponding to nodules identified by thyroid  ultrasound; please see recommendations of recent thyroid  ultrasound.     ASSESSMENT/PLAN/RECOMMENDATIONS:   MNG  -No local neck symptoms -Thyroid  uptake and scan shows subtle cold nodules at the lateral aspect of the mid and the inferior right thyroid  lobe but based on thyroid  ultrasound none of these nodules met criteria for FNA  -Repeat ultrasound 03/2022 shows stability of the nodules -Will repeat again this year   2.  Hyperthyroidism :   -Patient is clinically euthyroid -We have opted to treat with methimazole  given personal history of atrial fibrillation and the importance of keeping thyroid  levels at normal level to prevent any cardiac arrhythmias - TFT's showed decrease in the TSH, will increase methimazole  as below  Medication Increase methimazole  5 mg, half a tablet daily   F/U in 6 months    Signed electronically by: Stefano Redgie Butts, MD  [facility]  [facility] [address] [place] [place], [place] [address] Phone: [phone] FAX: [phone]   CC: Silvano Angeline FALCON, NP [address] [facility] [place] [address] Phone: [phone] Fax: [phone]   Return to Endocrinology clinic as below: Future Appointments  Date Time Provider Department Center  05/04/2023 10:15 AM [facility] None  05/04/2023 10:40 AM Federico Norleen ONEIDA MADISON, MD [facility] None

## 2023-03-30 LAB — T4, FREE: Free T4: 1 ng/dL (ref 0.8–1.8)

## 2023-03-30 LAB — TSH: TSH: 0.23 m[IU]/L — ABNORMAL LOW (ref 0.40–4.50)

## 2023-03-30 LAB — T3, FREE: T3, Free: 3.4 pg/mL (ref 2.3–4.2)

## 2023-03-30 MED ORDER — METHIMAZOLE 5 MG PO TABS: 2.5000 mg | ORAL_TABLET | Freq: Every day | ORAL | 3 refills | Status: DC

## 2023-04-05 ENCOUNTER — Telehealth: Payer: Self-pay | Admitting: Cardiology

## 2023-04-05 NOTE — Progress Notes (Signed)
Cardiology Clinic Note   Patient Name: Kellie Reynolds Date of Encounter: 04/08/2023  Primary Care Provider:  Erskine Emery, NP Primary Cardiologist:  Donato Schultz, MD  Patient Profile    Kellie Reynolds 68 year old female presents to the clinic today for follow-up evaluation of her atrial fibrillation and hypertension.  Past Medical History    Past Medical History:  Diagnosis Date   Arthritis    right knee   Breast CA (HCC) 2022   left   Family history of adverse reaction to anesthesia 35 yrs ago   daughter had malignant hyperthermia reaction with acentine, daughter has had surgeries since then and did welll   Fibrocystic breast disease    GERD (gastroesophageal reflux disease)    HTN (hypertension)    Hypercholesteremia    IDA (iron deficiency anemia)    MVP (mitral valve prolapse)    Paroxysmal atrial fibrillation (HCC)    a. s/p PVI   Skin cancer    Sleep apnea    no longer use CPAP   Past Surgical History:  Procedure Laterality Date   ATRIAL FIBRILLATION ABLATION N/A 04/18/2012   PVI by Allred   BREAST LUMPECTOMY  2022   COLONOSCOPY WITH PROPOFOL N/A 11/24/2015   Procedure: COLONOSCOPY WITH PROPOFOL;  Surgeon: Charolett Bumpers, MD;  Location: [facility] ENDOSCOPY;  Service: Endoscopy;  Laterality: N/A;   EYE SURGERY Bilateral    lasix eye surgery both eyes   MENISECTOMY     Right knee   NASAL POLYP SURGERY  1990   NASAL SINUS SURGERY  1990   TEE WITHOUT CARDIOVERSION  04/17/2012   Procedure: TRANSESOPHAGEAL ECHOCARDIOGRAM (TEE);  Surgeon: Corky Crafts, MD;  Location: [facility] ENDOSCOPY;  Service: Cardiovascular;  Laterality: N/A;  pre ablation   TUBAL LIGATION      Allergies  Allergies  Allergen Reactions   Anastrozole Other (See Comments)    unknown   Latex Itching, Rash and Other (See Comments)    Elastic in underwear     History of Present Illness    Kellie Reynolds has a PMH of precordial pain, hyperlipidemia, breast CVA, hypertension,  mitral valve prolapse, skin cancer, sleep apnea, hypertension, and atrial fibrillation.  She was referred to cardiology for evaluation and seen by Dr. Anne Fu on 05/17/2022.  She was referred by her PCP for evaluation of her coronary calcium score.  She reported occasional sharp episodes of chest discomfort that were fleeting.  She also reported shortness of breath with activity which she attributed to over 50 years of smoking.  She reported a family history of heart disease with her father died at age 49 of a heart attack and mother died at age 75 with heart failure.  A cardiac PET scan was ordered (07/28/2022), it showed no ischemia and low risk.  Her EF was noted to be 68%.  She presents to the clinic today for follow-up evaluation and states she is doing fairly well.  She did note that she had more palpitations after coming off of flecainide.  She resumed the medication.  We reviewed risks of this.  I will increase her metoprolol, have her continue to avoid triggers for palpitations, increase the fiber in her diet and plan follow-up in 12 months.  Today she denies chest pain, shortness of breath, lower extremity edema, fatigue, palpitations, melena, hematuria, hemoptysis, diaphoresis, weakness, presyncope, syncope, orthopnea, and PND.    Home Medications    Prior to Admission medications   Medication Sig Start  Date End Date Taking? Authorizing Provider  acetaminophen (TYLENOL) 500 MG tablet Take 1,500 mg by mouth 3 (three) times daily as needed for headache.    [provider]  alendronate (FOSAMAX) 70 MG tablet Take 70 mg by mouth once a week. 05/04/21   [provider]  Alirocumab (PRALUENT) 75 MG/ML SOAJ Inject 1 mL (75 mg total) into the skin every 14 (fourteen) days. 02/07/23   Jake Bathe, MD  atorvastatin (LIPITOR) 20 MG tablet Take 20 mg by mouth daily. 03/21/23   [provider]  citalopram (CELEXA) 10 MG tablet Take 10 mg by mouth daily. 12/28/18   [provider]  Coenzyme Q10 200 MG capsule Take 200 mg by mouth daily.    [provider]  cyclobenzaprine (FLEXERIL) 10 MG tablet Take 1 tablet (10 mg total) by mouth 2 (two) times daily as needed for muscle spasms. 08/22/22   [name] Crigler, PA-C  ezetimibe (ZETIA) 10 MG tablet Take 1 tablet (10 mg total) by mouth daily. 11/05/22   Jake Bathe, MD  ferrous sulfate 325 (65 FE) MG tablet Take 325 mg by mouth daily. 10/05/21   [provider]  flecainide (TAMBOCOR) 100 MG tablet Take 50 mg by mouth 2 (two) times daily. 03/24/23   [provider]  guaiFENesin (MUCINEX) 600 MG 12 hr tablet Take 600 mg by mouth daily as needed (allergies). Patient not taking: Reported on 03/29/2023    [provider]  letrozole [facility]) 2.5 MG tablet Take 2.5 mg by mouth daily. 04/24/21   [provider]  losartan (COZAAR) 50 MG tablet Take 50 mg by mouth in the morning and at bedtime. 01/01/20   [provider]  methimazole (TAPAZOLE) 5 MG tablet Take 0.5 tablets (2.5 mg total) by mouth daily. 03/30/23   Shamleffer, Konrad Dolores, MD  metoprolol tartrate (LOPRESSOR) 50 MG tablet TAKE 0.5 TABLETS BY MOUTH 2 TIMES DAILY 07/16/22   Eustace Pen, PA-C  omeprazole (PRILOSEC) 40 MG capsule TAKE 1 CAPSULE BY MOUTH 2 (TWO) TIMES DAILY 20-30 MINUTES BEFORE MEALS. 03/01/23   Jenel Lucks, MD  rosuvastatin (CRESTOR) 10 MG tablet Take 1 tablet (10 mg total) by mouth daily. 10/29/22 01/27/23  Jake Bathe, MD  sodium chloride (OCEAN) 0.65 % nasal spray Place 1 spray into the nose 2 (two) times daily as needed. For dryness    [provider]  XARELTO 20 MG TABS tablet TAKE 1 TABLET BY MOUTH DAILY WITH SUPPER. 11/16/22   Jake Bathe, MD    Family History    Family History  Problem Relation Age of Onset   Diabetes Mother    Hyperlipidemia Father    Hypertension Father    CAD Father    Heart attack Father    Liver cancer Maternal Grandmother    Leukemia  Maternal Grandfather    Colon cancer Neg Hx    Esophageal cancer Neg Hx    Stomach cancer Neg Hx    Rectal cancer Neg Hx    She indicated that her mother is deceased. She indicated that her father is deceased. She indicated that her maternal grandmother is deceased. She indicated that her maternal grandfather is deceased. She indicated that her paternal grandmother is deceased. She indicated that her paternal grandfather is deceased. She indicated that her daughter is alive. She indicated that the status of her neg hx is unknown.  Social History    Social History   Socioeconomic History   Marital  status: Divorced    Spouse name: Not on file   Number of children: 1   Years of education: Not on file   Highest education level: Not on file  Occupational History   Not on file  Tobacco Use   Smoking status: Every Day    Current packs/day: 2.00    Average packs/day: 2.0 packs/day for 50.0 years (100.0 ttl pk-yrs)    Types: Cigarettes   Smokeless tobacco: Never  Vaping Use   Vaping status: Never Used  Substance and Sexual Activity   Alcohol use: Yes    Comment: 4 beers oper day, regular sized beers   Drug use: No   Sexual activity: Not on file  Other Topics Concern   Not on file  Social History Narrative   Not on file   Social Drivers of Health   Financial Resource Strain: Not on file  Food Insecurity: Not on file  Transportation Needs: Not on file  Physical Activity: Not on file  Stress: Not on file  Social Connections: Not on file  Intimate Partner Violence: Not on file     Review of Systems    General:  No chills, fever, night sweats or weight changes.  Cardiovascular:  No chest pain, dyspnea on exertion, edema, orthopnea, palpitations, paroxysmal nocturnal dyspnea. Dermatological: No rash, lesions/masses Respiratory: No cough, dyspnea Urologic: No hematuria, dysuria Abdominal:   No nausea, vomiting, diarrhea, bright red blood per rectum, melena, or  hematemesis Neurologic:  No visual changes, wkns, changes in mental status. All other systems reviewed and are otherwise negative except as noted above.  Physical Exam    VS:  BP 134/70 (BP Location: Left Arm, Patient Position: Sitting, Cuff Size: Normal)   Pulse 60   Ht 5\' 6"  (1.676 m)   Wt 148 lb 9.6 oz (67.4 kg)   SpO2 94%   BMI 23.98 kg/m  , BMI Body mass index is 23.98 kg/m. GEN: Well nourished, well developed, in no acute distress. HEENT: normal. Neck: Supple, no JVD, carotid bruits, or masses. Cardiac: RRR, no murmurs, rubs, or gallops. No clubbing, cyanosis, edema.  Radials/DP/PT 2+ and equal bilaterally.  Respiratory:  Respirations regular and unlabored, clear to auscultation bilaterally. GI: Soft, nontender, nondistended, BS + x 4. MS: no deformity or atrophy. Skin: warm and dry, no rash. Neuro:  Strength and sensation are intact. Psych: Normal affect.  Accessory Clinical Findings    Recent Labs: 11/01/2022: ALT 11; BUN 5; Creatinine 0.60; Hemoglobin 12.1; Platelet Count 190; Potassium 4.4; Sodium 143 03/29/2023: TSH 0.23   Recent Lipid Panel    Component Value Date/Time   CHOL 170 10/06/2022 0728   TRIG 76 10/06/2022 0728   HDL 61 10/06/2022 0728   CHOLHDL 2.8 10/06/2022 0728   CHOLHDL 4 04/16/2014 0950   VLDL 20.4 04/16/2014 0950   LDLCALC 95 10/06/2022 0728         ECG personally reviewed by me today-none today.    Cardiac PET CT 07/28/2022    The study is normal. The study is low risk.   LV perfusion is normal. There is no evidence of ischemia. There is no evidence of infarction.   Rest left ventricular function is normal. Rest EF: 59 %. Stress left ventricular function is normal. Stress EF: 68 %. End diastolic cavity size is normal. End systolic cavity size is normal.   Myocardial blood flow was computed to be 1.24ml/g/min at rest and 2.73ml/g/min at stress. Global myocardial blood flow reserve was 2.35 and was normal.  Coronary calcium was present  on the attenuation correction CT images. Mild coronary calcifications were present. Coronary calcifications were present in the left anterior descending artery and right coronary artery distribution(s).   Electronically signed by Epifanio Lesches, MD   CLINICAL DATA:  This over-read does not include interpretation of cardiac or coronary anatomy or pathology. The Cardiac PET CT interpretation by the cardiologist is attached.   COMPARISON:  04/20/2022 calcium score CT.   FINDINGS: No pleural fluid.  Clear imaged lungs.   Normal aortic caliber.  Aortic atherosclerosis.   No imaged thoracic adenopathy.   Normal imaged portions of the liver, spleen, stomach.   Thoracic spondylosis.   IMPRESSION: 1. No acute findings in the imaged extracardiac chest. 2.  Aortic Atherosclerosis (ICD10-I70.0).     Electronically Signed   By: Jeronimo Greaves M.D.   On: 07/28/2022 11:35      Assessment & Plan   1.  Paroxysmal atrial fibrillation-heart rate today 60 bpm.  Reports compliance with Xarelto.  Denies bleeding issues.  Reports that she went back on flecainide medication.  We reviewed risks sepsis with her known coronary artery disease. Discontinue flecainide Avoid triggers caffeine, chocolate, EtOH, dehydration etc. Heart healthy low-sodium diet Increase physical activity Continue Xarelto,  Increase metoprolol 50 mg a.m. and 25 mg p.m.  Coronary artery disease-coronary calcium noted on calcium scoring.  Cardiac PET CT reassuring.  Normal EF and low risk. Continue current medical therapy Heart healthy low-sodium high-fiber diet  Hyperlipidemia-LDL 95 on 10/06/22. Continue atorvastatin High-fiber diet  Disposition: Follow-up with Dr. Anne Fu or me in 12 months.   Thomasene Ripple. Colinda Barth NP-C     04/08/2023, 11:01 AM [facility] [address] Office [phone] Fax [phone]    I spent 15 minutes examining this patient, reviewing  medications, and using patient centered shared decision making involving their cardiac care.   I spent greater than 20 minutes reviewing their past medical history,  medications, and prior cardiac tests.

## 2023-04-05 NOTE — Telephone Encounter (Signed)
Patient is requesting provider switch from Dr. Anne Fu to Dr. Servando Salina.

## 2023-04-06 ENCOUNTER — Other Ambulatory Visit: Payer: Self-pay | Admitting: Student

## 2023-04-06 DIAGNOSIS — Z09 Encounter for follow-up examination after completed treatment for conditions other than malignant neoplasm: Secondary | ICD-10-CM

## 2023-04-07 ENCOUNTER — Ambulatory Visit
Admission: RE | Admit: 2023-04-07 | Discharge: 2023-04-07 | Disposition: A | Discharge status: Home / Self Care | Payer: Medicare HMO | Source: Ambulatory Visit | Attending: Internal Medicine | Admitting: Internal Medicine

## 2023-04-07 DIAGNOSIS — E042 Nontoxic multinodular goiter: Secondary | ICD-10-CM

## 2023-04-08 ENCOUNTER — Encounter: Payer: Self-pay | Admitting: General Practice

## 2023-04-08 ENCOUNTER — Ambulatory Visit: Payer: Medicare HMO | Attending: General Practice | Admitting: General Practice

## 2023-04-08 VITALS — BP 134/70 | HR 60 | Ht 66.0 in | Wt 148.6 lb

## 2023-04-08 DIAGNOSIS — E785 Hyperlipidemia, unspecified: Secondary | ICD-10-CM

## 2023-04-08 DIAGNOSIS — I2510 Atherosclerotic heart disease of native coronary artery without angina pectoris: Secondary | ICD-10-CM

## 2023-04-08 DIAGNOSIS — I480 Paroxysmal atrial fibrillation: Secondary | ICD-10-CM

## 2023-04-08 MED ORDER — METOPROLOL TARTRATE 50 MG PO TABS: ORAL_TABLET | ORAL | 3 refills | Status: DC

## 2023-04-08 NOTE — Patient Instructions (Addendum)
Medication Instructions:  TAKE METOPROLOL 50MG  IN THE AM AND 25MG  IN THE PM STOP FLECANIDE *If you need a refill on your cardiac medications before your next appointment, please call your pharmacy*  Lab Work: NONE  Other Instructions CALL IF YOUR HEART-RATE IS FAST OR IRREGULAR STAY HYDRATED Please try to avoid these triggers: Do not use any products that have nicotine or tobacco in them. These include cigarettes, e-cigarettes, and chewing tobacco. If you need help quitting, ask your doctor. Eat heart-healthy foods. Talk with your doctor about the right eating plan for you. Exercise regularly as told by your doctor. Stay hydrated Do not drink alcohol, Caffeine or chocolate. Lose weight if you are overweight. Do not use drugs, including cannabis  Follow-Up: At [facility], you and your health needs are our priority.  As part of our continuing mission to provide you with exceptional heart care, we have created designated Provider Care Teams.  These Care Teams include your primary Cardiologist (physician) and Advanced Practice Providers (APPs -  Physician Assistants and Nurse Practitioners) who all work together to provide you with the care you need, when you need it.  Your next appointment:   9-12 month(s)  Provider:   Donato Schultz, MD

## 2023-04-09 ENCOUNTER — Emergency Department (HOSPITAL_COMMUNITY): Payer: Medicare HMO

## 2023-04-09 ENCOUNTER — Emergency Department (HOSPITAL_COMMUNITY)
Admission: EM | Admit: 2023-04-09 | Discharge: 2023-04-09 | Disposition: A | Discharge status: Home / Self Care | Payer: Medicare HMO | Attending: Emergency Medicine | Admitting: Emergency Medicine

## 2023-04-09 DIAGNOSIS — Z91040 Latex allergy status: Secondary | ICD-10-CM | POA: Insufficient documentation

## 2023-04-09 DIAGNOSIS — S99912A Unspecified injury of left ankle, initial encounter: Secondary | ICD-10-CM | POA: Diagnosis present

## 2023-04-09 DIAGNOSIS — M7989 Other specified soft tissue disorders: Secondary | ICD-10-CM | POA: Insufficient documentation

## 2023-04-09 DIAGNOSIS — S93402A Sprain of unspecified ligament of left ankle, initial encounter: Secondary | ICD-10-CM | POA: Diagnosis not present

## 2023-04-09 DIAGNOSIS — Z7901 Long term (current) use of anticoagulants: Secondary | ICD-10-CM | POA: Insufficient documentation

## 2023-04-09 DIAGNOSIS — X501XXA Overexertion from prolonged static or awkward postures, initial encounter: Secondary | ICD-10-CM | POA: Insufficient documentation

## 2023-04-09 NOTE — ED Provider Notes (Signed)
[facility] AT [facility] Provider Note   CSN: [id number] Arrival date & time: 04/09/23  7829     History  Chief Complaint  Patient presents with   Ankle Pain    MADA SADIK is a 68 y.o. female.  Here with left ankle pain after rolling her ankle yesterday.  She has been able to ambulate but worsening swelling and pain this morning.  Nothing makes it worse or better.  She is on Xarelto.  Denies any other trauma or falls or injury elsewhere.  Denies any weakness numbness tingling.  The history is provided by the patient.       Home Medications Prior to Admission medications   Medication Sig Start Date End Date Taking? Authorizing Provider  acetaminophen (TYLENOL) 500 MG tablet Take 1,500 mg by mouth 3 (three) times daily as needed for headache.    [provider]  alendronate (FOSAMAX) 70 MG tablet Take 70 mg by mouth once a week. 05/04/21   [provider]  atorvastatin (LIPITOR) 20 MG tablet Take 20 mg by mouth daily. 03/21/23   [provider]  citalopram (CELEXA) 10 MG tablet Take 10 mg by mouth daily. 12/28/18   [provider]  ferrous sulfate 325 (65 FE) MG tablet Take 325 mg by mouth daily. 10/05/21   [provider]  flecainide (TAMBOCOR) 100 MG tablet Take 50 mg by mouth 2 (two) times daily. 03/24/23   [provider]  guaiFENesin (MUCINEX) 600 MG 12 hr tablet Take 600 mg by mouth daily as needed (allergies).    [provider]  letrozole (FEMARA) 2.5 MG tablet Take 2.5 mg by mouth daily. 04/24/21   [provider]  methimazole (TAPAZOLE) 5 MG tablet Take 0.5 tablets (2.5 mg total) by mouth daily. 03/30/23   Shamleffer, Konrad Dolores, MD  metoprolol tartrate (LOPRESSOR) 50 MG tablet Take 1 tablet (50 mg total) by mouth every morning AND 0.5 tablets (25 mg total) every evening. 04/08/23   Ronney Asters, NP  omeprazole (PRILOSEC) 40 MG capsule TAKE 1 CAPSULE BY MOUTH 2  (TWO) TIMES DAILY 20-30 MINUTES BEFORE MEALS. 03/01/23   Jenel Lucks, MD  sodium chloride (OCEAN) 0.65 % nasal spray Place 1 spray into the nose 2 (two) times daily as needed. For dryness    [provider]  XARELTO 20 MG TABS tablet TAKE 1 TABLET BY MOUTH DAILY WITH SUPPER. 11/16/22   Jake Bathe, MD      Allergies    Anastrozole and Latex    Review of Systems   Review of Systems  Physical Exam Updated Vital Signs BP (!) 148/63 (BP Location: Left Arm)   Pulse 63   Temp 98.2 F (36.8 C) (Oral)   Resp 16   Ht 5\' 6"  (1.676 m)   Wt 67.1 kg   SpO2 98%   BMI 23.89 kg/m  Physical Exam Vitals and nursing note reviewed.  Constitutional:      General: She is not in acute distress.    Appearance: She is well-developed.  HENT:     Head: Normocephalic and atraumatic.  Eyes:     Conjunctiva/sclera: Conjunctivae normal.  Cardiovascular:     Rate and Rhythm: Normal rate and regular rhythm.     Pulses: Normal pulses.     Heart sounds: No murmur heard. Pulmonary:     Effort: Pulmonary effort is normal. No respiratory distress.     Breath sounds: Normal breath sounds.  Abdominal:  Palpations: Abdomen is soft.     Tenderness: There is no abdominal tenderness.  Musculoskeletal:        General: Swelling and tenderness present. Normal range of motion.     Cervical back: Normal range of motion and neck supple.     Comments: Some mild swelling and tenderness over the left lateral malleolus of the left ankle.  Skin:    General: Skin is warm and dry.     Capillary Refill: Capillary refill takes less than 2 seconds.  Neurological:     Mental Status: She is alert.     Sensory: No sensory deficit.     Motor: No weakness.  Psychiatric:        Mood and Affect: Mood normal.     ED Results / Procedures / Treatments   Labs (all labs ordered are listed, but only abnormal results are displayed) Labs Reviewed - No data to display  EKG None  Radiology DG Ankle  Complete Left Result Date: 04/09/2023 CLINICAL DATA:  Inversion injury to the ankle EXAM: LEFT ANKLE COMPLETE - 3 VIEW COMPARISON:  None Available. FINDINGS: There are no findings of fracture or dislocation. Moderate joint effusion. There is no evidence of arthropathy or other focal bone abnormality. Ankle mortise is intact. Soft tissue swelling over the lateral malleolus. IMPRESSION: 1. No acute fracture or dislocation. 2. Moderate joint effusion and soft tissue swelling over the lateral malleolus. Electronically Signed   By: Agustin Cree M.D.   On: 04/09/2023 10:01    Procedures Procedures    Medications Ordered in ED Medications - No data to display  ED Course/ Medical Decision Making/ A&P                                 Medical Decision Making Amount and/or Complexity of Data Reviewed Radiology: ordered.   Inari Shin Attridge is here with left ankle pain after rolling her ankle yesterday.  Neurovascular neuromuscular intact.  Normal vitals.  She is on anticoagulation for A-fib.  She did not hit her head or lose consciousness.  Differential diagnosis likely ankle sprain but will get x-ray to rule out fracture.  X-ray per my review and interpretation shows no evidence of fracture.  Overall suspect ankle sprain.  Will put her in a walking boot and recommend Tylenol and rest and ice and follow-up with orthopedics.  Discharged in good condition.  Understands return precautions.  Activity as tolerated.  This chart was dictated using voice recognition software.  Despite best efforts to proofread,  errors can occur which can change the documentation meaning.         Final Clinical Impression(s) / ED Diagnoses Final diagnoses:  Sprain of left ankle, unspecified ligament, initial encounter    Rx / DC Orders ED Discharge Orders     None         Virgina Norfolk, DO 04/09/23 1028

## 2023-04-09 NOTE — Discharge Instructions (Addendum)
X-ray showed no fracture or broken bone.  Overall you have a sprain of your left ankle.  Wear walking boot for comfort.  Activity as tolerated.  Recommend Tylenol and ice and rest and follow-up with your orthopedic doctor.

## 2023-04-09 NOTE — ED Triage Notes (Addendum)
Patient reports rolling left ankle yesterday and hearing "click, click, click" Swelling developed today, visible upon assessment Denies pain while still, pain 6/10 while walking Decreased ROM  Took tylenol prior to arrival

## 2023-04-09 NOTE — Progress Notes (Signed)
Orthopedic Tech Progress Note Patient Details:  Kellie Reynolds [birth year] [id number]  Ortho Devices Type of Ortho Device: CAM walker Ortho Device/Splint Location: LLE Ortho Device/Splint Interventions: Ordered, Application, Adjustment   Post Interventions Patient Tolerated: Well Instructions Provided: Adjustment of device, Care of device, Poper ambulation with device  [name] 04/09/2023, 10:29 AM

## 2023-04-13 ENCOUNTER — Encounter: Payer: Self-pay | Admitting: Internal Medicine

## 2023-05-02 ENCOUNTER — Other Ambulatory Visit: Payer: Self-pay | Admitting: Hematology and Oncology

## 2023-05-02 DIAGNOSIS — D500 Iron deficiency anemia secondary to blood loss (chronic): Secondary | ICD-10-CM

## 2023-05-02 NOTE — Progress Notes (Unsigned)
 [name] [phone]   [phone]  PROGRESS NOTE  Patient Care Team: [facility] as PCP - General Jake Bathe, MD as PCP - Cardiology (Cardiology)  Hematological/Oncological History -10/03/2021: Hgb 7.8. MCV 75.4 -10/23/2021: Hgb 8.6, MCV 75.1, Iron 25, Ferritin 15.6, Saturation 5.1%.  -11/18/2021: Establish care with [facility]emat[facility] with Dr. Jeanie Sewer and Georga Kaufmann PA-C  CHIEF COMPLAINTS/PURPOSE OF CONSULTATION:  Iron deficiency anemia H/O breast cancer  HISTORY OF PRESENTING ILLNESS:  Kellie Reynolds 68 y.o. female returns for a follow up for iron deficiency anemia and history of breast cancer. She is unaccompanied for this visit.   On exam today, Kellie Reynolds reports that she has been "not bad" the interim since her last visit.  Her energy is "so-so".  She reports that she could fall asleep now.  She is not any food today because she thought she had to fast for her labs.  She notes that she has not been having any trouble with bleeding, bruising, or dark stools.  She is taking Xarelto 20 mg p.o. daily.  She recently stopped her iron pills because her PCP told her that they were not of much use anymore.  She also reports that she has issues with diarrhea fluctuant with constipation.  She is not having any runny nose, sore throat, or cough.  She reports she does not eat any meat but will try to eat more iron rich foods.  She denies fevers, chills, sweats, shortness of breath, chest pain or cough. She has no other complaints. Rest of the 10 point ROS is below.   MEDICAL HISTORY:  Past Medical History:  Diagnosis Date   Arthritis    right knee   Breast CA (HCC) 2022   left   Family history of adverse reaction to anesthesia 35 yrs ago   daughter had malignant hyperthermia reaction with acentine, daughter has had surgeries since then and did welll   Fibrocystic breast disease    GERD (gastroesophageal reflux disease)     HTN (hypertension)    Hypercholesteremia    IDA (iron deficiency anemia)    MVP (mitral valve prolapse)    Paroxysmal atrial fibrillation (HCC)    a. s/p PVI   Skin cancer    Sleep apnea    no longer use CPAP    SURGICAL HISTORY: Past Surgical History:  Procedure Laterality Date   ATRIAL FIBRILLATION ABLATION N/A 04/18/2012   PVI by Allred   BREAST LUMPECTOMY  2022   COLONOSCOPY WITH PROPOFOL N/A 11/24/2015   Procedure: COLONOSCOPY WITH PROPOFOL;  Surgeon: Charolett Bumpers, MD;  Location: [facility];  Service: Endoscopy;  Laterality: N/A;   EYE SURGERY Bilateral    lasix eye surgery both eyes   MENISECTOMY     Right knee   NASAL POLYP SURGERY  1990   NASAL SINUS SURGERY  1990   TEE WITHOUT CARDIOVERSION  04/17/2012   Procedure: TRANSESOPHAGEAL ECHOCARDIOGRAM (TEE);  Surgeon: Corky Crafts, MD;  Location: [facility] ENDOSCOPY;  Service: Cardiovascular;  Laterality: N/A;  pre ablation   TUBAL LIGATION      SOCIAL HISTORY: Social History   Socioeconomic History   Marital status: Divorced    Spouse name: Not on file   Number of children: 1   Years of education: Not on file   Highest education level: Not on file  Occupational History   Not on file  Tobacco Use   Smoking status: Every Day  Current packs/day: 2.00    Average packs/day: 2.0 packs/day for 50.0 years (100.0 ttl pk-yrs)    Types: Cigarettes   Smokeless tobacco: Never  Vaping Use   Vaping status: Never Used  Substance and Sexual Activity   Alcohol use: Yes    Comment: 4 beers oper day, regular sized beers   Drug use: No   Sexual activity: Not on file  Other Topics Concern   Not on file  Social History Narrative   Not on file   Social Drivers of Health   Financial Resource Strain: Not on file  Food Insecurity: Not on file  Transportation Needs: Not on file  Physical Activity: Not on file  Stress: Not on file  Social Connections: Not on file  Intimate Partner Violence: Not on file    FAMILY  HISTORY: Family History  Problem Relation Age of Onset   Diabetes Mother    Hyperlipidemia Father    Hypertension Father    CAD Father    Heart attack Father    Liver cancer Maternal Grandmother    Leukemia Maternal Grandfather    Colon cancer Neg Hx    Esophageal cancer Neg Hx    Stomach cancer Neg Hx    Rectal cancer Neg Hx     ALLERGIES:  is allergic to anastrozole and latex.  MEDICATIONS:  Current Outpatient Medications  Medication Sig Dispense Refill   acetaminophen (TYLENOL) 500 MG tablet Take 1,500 mg by mouth 3 (three) times daily as needed for headache.     alendronate (FOSAMAX) 70 MG tablet Take 70 mg by mouth once a week.     atorvastatin (LIPITOR) 20 MG tablet Take 20 mg by mouth daily.     citalopram (CELEXA) 10 MG tablet Take 10 mg by mouth daily.     ferrous sulfate 325 (65 FE) MG tablet Take 325 mg by mouth daily.     flecainide (TAMBOCOR) 100 MG tablet Take 50 mg by mouth 2 (two) times daily.     guaiFENesin (MUCINEX) 600 MG 12 hr tablet Take 600 mg by mouth daily as needed (allergies).     letrozole (FEMARA) 2.5 MG tablet Take 2.5 mg by mouth daily.     methimazole (TAPAZOLE) 5 MG tablet Take 0.5 tablets (2.5 mg total) by mouth daily. 52 tablet 3   metoprolol tartrate (LOPRESSOR) 50 MG tablet Take 1 tablet (50 mg total) by mouth every morning AND 0.5 tablets (25 mg total) every evening. 45 tablet 3   omeprazole (PRILOSEC) 40 MG capsule TAKE 1 CAPSULE BY MOUTH 2 (TWO) TIMES DAILY 20-30 MINUTES BEFORE MEALS. 180 capsule 1   sodium chloride (OCEAN) 0.65 % nasal spray Place 1 spray into the nose 2 (two) times daily as needed. For dryness     XARELTO 20 MG TABS tablet TAKE 1 TABLET BY MOUTH DAILY WITH SUPPER. 30 tablet 5   No current facility-administered medications for this visit.    REVIEW OF SYSTEMS:   Constitutional: ( - ) fevers, ( - )  chills , ( - ) night sweats Eyes: ( - ) blurriness of vision, ( - ) double vision, ( - ) watery eyes Ears, nose, mouth,  throat, and face: ( - ) mucositis, ( - ) sore throat Respiratory: ( - ) cough, ( + ) dyspnea, ( - ) wheezes Cardiovascular: ( - ) palpitation, ( - ) chest discomfort, ( - ) lower extremity swelling Gastrointestinal:  ( + ) nausea, ( - ) heartburn, ( + )  change in bowel habits Skin: ( - ) abnormal skin rashes Lymphatics: ( - ) new lymphadenopathy, ( - ) easy bruising Neurological: ( - ) numbness, ( - ) tingling, ( - ) new weaknesses Behavioral/Psych: ( - ) mood change, ( - ) new changes  All other systems were reviewed with the patient and are negative.  PHYSICAL EXAMINATION: ECOG PERFORMANCE STATUS: 1 - Symptomatic but completely ambulatory  There were no vitals filed for this visit.   There were no vitals filed for this visit.    GENERAL: well appearing female in NAD  SKIN: skin color, texture, turgor are normal, no rashes or significant lesions EYES: conjunctiva are pink and non-injected, sclera clear.  LUNGS: clear to auscultation and percussion with normal breathing effort HEART: regular rate & rhythm and no murmurs and no lower extremity edema Musculoskeletal: no cyanosis of digits and no clubbing  PSYCH: alert & oriented x 3, fluent speech NEURO: no focal motor/sensory deficits BREAT: firmness in left upper breast near lumpectomy site. No palpable masses appreciated. No axillary lymphadenopathy.   LABORATORY DATA:  I have reviewed the data as listed    Latest Ref Rng & Units 11/01/2022   10:30 AM 04/30/2022    9:55 AM 03/01/2022    4:12 PM  CBC  WBC 4.0 - 10.5 K/uL 6.6  7.3  10.5   Hemoglobin 12.0 - 15.0 g/dL 82.9  56.2  13.0   Hematocrit 36.0 - 46.0 % 36.0  39.7  40.6   Platelets 150 - 400 K/uL 190  170  238.0        Latest Ref Rng & Units 11/01/2022   10:30 AM 04/30/2022    9:55 AM 01/26/2022   12:34 PM  CMP  Glucose 70 - 99 mg/dL 865  784  696   BUN 8 - 23 mg/dL 5  7  6    Creatinine 0.44 - 1.00 mg/dL 2.95  2.84  1.32   Sodium 135 - 145 mmol/L 143  141  137    Potassium 3.5 - 5.1 mmol/L 4.4  4.0  3.9   Chloride 98 - 111 mmol/L 106  104  101   CO2 22 - 32 mmol/L 31  32  29   Calcium 8.9 - 10.3 mg/dL 9.0  9.2  9.2   Total Protein 6.5 - 8.1 g/dL 6.5  6.7  6.9   Total Bilirubin 0.3 - 1.2 mg/dL 0.5  0.6  0.3   Alkaline Phos 38 - 126 U/L 76  64  85   AST 15 - 41 U/L 16  13  14    ALT 0 - 44 U/L 11  11  10      RADIOGRAPHIC STUDIES: I have personally reviewed the radiological images as listed and agreed with the findings in the report. US THYROID Result Date: 04/13/2023 CLINICAL DATA:  Prior ultrasound follow-up. Imaging surveillance of nodules in the right mid thyroid gland. EXAM: THYROID ULTRASOUND TECHNIQUE: Ultrasound examination of the thyroid gland and adjacent soft tissues was performed. COMPARISON:  Prior thyroid ultrasound 04/05/2022; 03/25/2021 FINDINGS: Parenchymal Echotexture: Mildly heterogenous Isthmus: 0.4 cm Right lobe: 6.6 x 2.3 x 2.8 cm Left lobe: 6.1 x 2.1 x 2.2 cm _________________________________________________________ Estimated total number of nodules >/= 1 cm: 3 Number of spongiform nodules >/=  2 cm not described below (TR1): 0 Number of mixed cystic and solid nodules >/= 1.5 cm not described below (TR2): 0 _________________________________________________________ Enlarged, heterogeneous thyroid gland containing multiple cysts and nodules. Many of the lesions  measure less than 1 cm or otherwise demonstrate overtly benign features and do not warrant further consideration. Nodule # 2: Small hypoechoic solid nodule in the right mid gland demonstrates internal dystrophic calcifications and remains stable in size and appearance at 1.1 x 1.0 x 1.0 cm. Findings remain consistent with TI-RADS category 4. *Given size (>/= 1 - 1.4 cm) and appearance, a follow-up ultrasound in 1 year should be considered based on TI-RADS criteria. Nodule # 3: Interval increase in cystic degeneration of this lesion which now has a spongiform appearance. This appearance  is sonographically low risk/benign. No further follow-up. Nodule # 4: Stable appearance of spongiform nodule in the right lower gland at 1.3 cm. No further follow-up. IMPRESSION: 1. Continued stability of nodule # 2 in the right mid gland this TI-RADS category 4 nodule continues to meet criteria for imaging surveillance. The present exam confirms 2 years of stability. Recommend follow-up ultrasound annually until 5 years of stability is confirmed. 2. Interval cystic degeneration of nodule # 3 which now has a sonographically benign/low risk spongiform appearance. This lesion no longer meets criteria for further surveillance. 3. No new nodules or suspicious features. The above is in keeping with the ACR TI-RADS recommendations - J Am Coll Radiol 2017;14:587-595. Electronically Signed   By: Malachy Moan M.D.   On: 04/13/2023 09:50   DG Ankle Complete Left Result Date: 04/09/2023 CLINICAL DATA:  Inversion injury to the ankle EXAM: LEFT ANKLE COMPLETE - 3 VIEW COMPARISON:  None Available. FINDINGS: There are no findings of fracture or dislocation. Moderate joint effusion. There is no evidence of arthropathy or other focal bone abnormality. Ankle mortise is intact. Soft tissue swelling over the lateral malleolus. IMPRESSION: 1. No acute fracture or dislocation. 2. Moderate joint effusion and soft tissue swelling over the lateral malleolus. Electronically Signed   By: Agustin Cree M.D.   On: 04/09/2023 10:01    ASSESSMENT & PLAN Kellie Reynolds is a 68 y.o. returns for a follow up for iron deficiency anemia and history of left breast cancer.   #Iron deficiency anemia 2/2 chronic blood loss: --Currently on ferrous sulfate 325 mg once a day with a source of vitamin C --Patient follows vegetarian diet so provided list of iron rich foods to incorporate into diet.  --Under the care of Dr. Tomasa Rand (GI) and underwent EGD/colonoscopy from 06/05/2021 showed no evidence of active bleeding. Plan to repeat colonoscopy  with ablation of cecal AVM if patient exhibits evidence of rebleeding --Underwent capsular EGD on 11/09/2021 that was incomplete due to capsule retention in the stomach. Small bowel enteroscopy was done on 12/07/2021 that showed no evidence of significant pathology in the entire duodenum/proximal jejunum. --Labs today show anemia has resolved with Hgb 12.1, hemoglobin 6.6, MCV 89.6, and platelets of 190.  Iron panel shows no evidence of deficiency.  --No need for IV iron at this time.  #H/o left breast cancer --Diagnosed in June 2022 and treated at [facility] --Underwent lumpectomy on 07/29/2020. 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 --Not enough tissue for Oncotype testing.  --Received adjust radiation to the left breast 10/15/2020-11/11/2020.  --Patient requested to transfer care to [facility] due to convenience of travel.  --Status post adjuvant endocrine therapy with anastrozole 1 mg daily, 11/24/2020, discontinued October 2022 secondary to myalgias and arthralgias.  --Started adjuvant endocrine therapy with letrozole 2.5 mg daily on 02/03/2021. Plan for total of 7 years of  therapy.  --Last mammogram was on 05/13/2022 that showed no evidence of malignancies. Next mammogram due in Feb/March 2025. --Due to firmness palpated in left upper breast today, we will request breast US along with mammogram --Last bone density study was in July 2024 and findings consistent with osteopenia. Current on regimen for bone health including vitamin D/calcium and foasamax.  Follow up: --6 months: labs and follow up visit   No orders of the defined types were placed in this encounter.   All questions were answered. The patient knows to call the clinic with any problems, questions or concerns.  I have spent a total of 30 minutes minutes of face-to-face and non-face-to-face time, preparing to see the patient, performing a  medically appropriate examination, counseling and educating the patient, ordering tests, communicating with other health care professionals,  and care coordination.   Ulysees Barns, MD Department of Hematology/Oncology [facility] Phone: [phone] Pager: [phone] Email: [email or web address]

## 2023-05-04 ENCOUNTER — Inpatient Hospital Stay (HOSPITAL_BASED_OUTPATIENT_CLINIC_OR_DEPARTMENT_OTHER): Payer: Medicare HMO | Admitting: Hematology and Oncology

## 2023-05-04 ENCOUNTER — Inpatient Hospital Stay: Payer: Medicare HMO | Attending: Hematology and Oncology

## 2023-05-04 VITALS — BP 153/53 | HR 51 | Temp 97.6°F | Resp 16 | Wt 153.9 lb

## 2023-05-04 DIAGNOSIS — Z1721 Progesterone receptor positive status: Secondary | ICD-10-CM | POA: Diagnosis not present

## 2023-05-04 DIAGNOSIS — Z79899 Other long term (current) drug therapy: Secondary | ICD-10-CM | POA: Insufficient documentation

## 2023-05-04 DIAGNOSIS — C50912 Malignant neoplasm of unspecified site of left female breast: Secondary | ICD-10-CM | POA: Insufficient documentation

## 2023-05-04 DIAGNOSIS — Z808 Family history of malignant neoplasm of other organs or systems: Secondary | ICD-10-CM | POA: Diagnosis not present

## 2023-05-04 DIAGNOSIS — Z170 Estrogen receptor positive status [ER+]: Secondary | ICD-10-CM | POA: Diagnosis not present

## 2023-05-04 DIAGNOSIS — Z806 Family history of leukemia: Secondary | ICD-10-CM | POA: Insufficient documentation

## 2023-05-04 DIAGNOSIS — M8580 Other specified disorders of bone density and structure, unspecified site: Secondary | ICD-10-CM | POA: Insufficient documentation

## 2023-05-04 DIAGNOSIS — Z79811 Long term (current) use of aromatase inhibitors: Secondary | ICD-10-CM | POA: Diagnosis not present

## 2023-05-04 DIAGNOSIS — D500 Iron deficiency anemia secondary to blood loss (chronic): Secondary | ICD-10-CM

## 2023-05-04 DIAGNOSIS — Z853 Personal history of malignant neoplasm of breast: Secondary | ICD-10-CM | POA: Diagnosis not present

## 2023-05-04 DIAGNOSIS — D509 Iron deficiency anemia, unspecified: Secondary | ICD-10-CM | POA: Insufficient documentation

## 2023-05-04 DIAGNOSIS — F17210 Nicotine dependence, cigarettes, uncomplicated: Secondary | ICD-10-CM | POA: Diagnosis not present

## 2023-05-04 DIAGNOSIS — Z1732 Human epidermal growth factor receptor 2 negative status: Secondary | ICD-10-CM | POA: Insufficient documentation

## 2023-05-04 DIAGNOSIS — Z923 Personal history of irradiation: Secondary | ICD-10-CM | POA: Diagnosis not present

## 2023-05-04 LAB — CBC WITH DIFFERENTIAL (CANCER CENTER ONLY)
Abs Immature Granulocytes: 0.02 10*3/uL (ref 0.00–0.07)
Basophils Absolute: 0 10*3/uL (ref 0.0–0.1)
Basophils Relative: 0 %
Eosinophils Absolute: 0.2 10*3/uL (ref 0.0–0.5)
Eosinophils Relative: 3 %
HCT: 37.9 % (ref 36.0–46.0)
Hemoglobin: 12.2 g/dL (ref 12.0–15.0)
Immature Granulocytes: 0 %
Lymphocytes Relative: 20 %
Lymphs Abs: 1.2 10*3/uL (ref 0.7–4.0)
MCH: 27.7 pg (ref 26.0–34.0)
MCHC: 32.2 g/dL (ref 30.0–36.0)
MCV: 85.9 fL (ref 80.0–100.0)
Monocytes Absolute: 0.5 10*3/uL (ref 0.1–1.0)
Monocytes Relative: 8 %
Neutro Abs: 4.1 10*3/uL (ref 1.7–7.7)
Neutrophils Relative %: 69 %
Platelet Count: 168 10*3/uL (ref 150–400)
RBC: 4.41 MIL/uL (ref 3.87–5.11)
RDW: 18.6 % — ABNORMAL HIGH (ref 11.5–15.5)
WBC Count: 6 10*3/uL (ref 4.0–10.5)
nRBC: 0 % (ref 0.0–0.2)

## 2023-05-04 LAB — CMP (CANCER CENTER ONLY)
ALT: 10 U/L (ref 0–44)
AST: 13 U/L — ABNORMAL LOW (ref 15–41)
Albumin: 4 g/dL (ref 3.5–5.0)
Alkaline Phosphatase: 62 U/L (ref 38–126)
Anion gap: 4 — ABNORMAL LOW (ref 5–15)
BUN: 7 mg/dL — ABNORMAL LOW (ref 8–23)
CO2: 31 mmol/L (ref 22–32)
Calcium: 9.5 mg/dL (ref 8.9–10.3)
Chloride: 105 mmol/L (ref 98–111)
Creatinine: 0.55 mg/dL (ref 0.44–1.00)
GFR, Estimated: >60 mL/min (ref >60)
Glucose, Bld: 101 mg/dL — ABNORMAL HIGH (ref 70–99)
Potassium: 4.4 mmol/L (ref 3.5–5.1)
Sodium: 140 mmol/L (ref 135–145)
Total Bilirubin: 0.4 mg/dL (ref 0.0–1.2)
Total Protein: 6.3 g/dL — ABNORMAL LOW (ref 6.5–8.1)

## 2023-05-04 LAB — RETIC PANEL
Immature Retic Fract: 2.9 % (ref 2.3–15.9)
RBC.: 4.45 MIL/uL (ref 3.87–5.11)
Retic Count, Absolute: 39.6 10*3/uL (ref 19.0–186.0)
Retic Ct Pct: 0.9 % (ref 0.4–3.1)
Reticulocyte Hemoglobin: 32 pg (ref >27.9)

## 2023-05-04 LAB — IRON AND IRON BINDING CAPACITY (CC-WL,HP ONLY)
Iron: 89 ug/dL (ref 28–170)
Saturation Ratios: 27 % (ref 10.4–31.8)
TIBC: 326 ug/dL (ref 250–450)
UIBC: 237 ug/dL (ref 148–442)

## 2023-05-04 LAB — FERRITIN: Ferritin: 18 ng/mL (ref 11–307)

## 2023-05-11 ENCOUNTER — Other Ambulatory Visit: Payer: Self-pay | Admitting: Pharmacist

## 2023-05-11 ENCOUNTER — Other Ambulatory Visit: Payer: Self-pay | Admitting: Student

## 2023-05-11 ENCOUNTER — Telehealth: Payer: Self-pay | Admitting: Pharmacist

## 2023-05-11 DIAGNOSIS — Z9889 Other specified postprocedural states: Secondary | ICD-10-CM

## 2023-05-11 DIAGNOSIS — E785 Hyperlipidemia, unspecified: Secondary | ICD-10-CM

## 2023-05-11 DIAGNOSIS — Z09 Encounter for follow-up examination after completed treatment for conditions other than malignant neoplasm: Secondary | ICD-10-CM

## 2023-05-11 DIAGNOSIS — Z853 Personal history of malignant neoplasm of breast: Secondary | ICD-10-CM

## 2023-05-11 NOTE — Telephone Encounter (Signed)
 Call pt to remind about f/u lipid lab. Praluent restarted in Nov ( enrolled in the grant to make it more affordable) patient today states she does not want to take any injection just want to continue taking Lipitor 20 mg. Common side effects of Praluent  and LDLc goal discussed with the patient. Patient not interested in going to be go on any other lipid lowering agent for now. Will discuss with Dr.Skains in future.

## 2023-05-16 ENCOUNTER — Other Ambulatory Visit: Payer: Self-pay | Admitting: Student

## 2023-05-16 ENCOUNTER — Ambulatory Visit
Admission: RE | Admit: 2023-05-16 | Discharge: 2023-05-16 | Disposition: A | Discharge status: Home / Self Care | Payer: Medicare HMO | Source: Ambulatory Visit | Attending: Student | Admitting: Student

## 2023-05-16 ENCOUNTER — Ambulatory Visit
Admission: RE | Admit: 2023-05-16 | Discharge: 2023-05-16 | Disposition: A | Discharge status: Home / Self Care | Source: Ambulatory Visit | Attending: Student | Admitting: Student

## 2023-05-16 DIAGNOSIS — Z09 Encounter for follow-up examination after completed treatment for conditions other than malignant neoplasm: Secondary | ICD-10-CM

## 2023-05-16 DIAGNOSIS — Z9889 Other specified postprocedural states: Secondary | ICD-10-CM

## 2023-05-16 DIAGNOSIS — Z853 Personal history of malignant neoplasm of breast: Secondary | ICD-10-CM

## 2023-05-20 ENCOUNTER — Telehealth: Payer: Self-pay | Admitting: *Deleted

## 2023-05-20 NOTE — Telephone Encounter (Signed)
 TCT patient regarding recent mammogram and [place]. Spoke with her.  Advised that her mammogram and US show no evidence of recurrence. Pt pleased with results. No questions or concerns.

## 2023-05-20 NOTE — Telephone Encounter (Signed)
-----   Message from Briant Cedar sent at 05/19/2023 10:30 PM EST ----- Please notify patient that mammogram and US show no evidence of recurrence ----- Message ----- From: Interface, Rad Results In Sent: 05/16/2023   3:53 PM EST To: Jaci Standard, MD

## 2023-06-13 ENCOUNTER — Other Ambulatory Visit (HOSPITAL_COMMUNITY): Payer: Self-pay | Admitting: Family

## 2023-06-13 DIAGNOSIS — Z87891 Personal history of nicotine dependence: Secondary | ICD-10-CM

## 2023-06-24 ENCOUNTER — Ambulatory Visit (HOSPITAL_COMMUNITY)
Admission: RE | Admit: 2023-06-24 | Discharge: 2023-06-24 | Disposition: A | Discharge status: Home / Self Care | Source: Ambulatory Visit | Attending: Family | Admitting: Family

## 2023-06-24 DIAGNOSIS — Z87891 Personal history of nicotine dependence: Secondary | ICD-10-CM | POA: Diagnosis present

## 2023-07-17 ENCOUNTER — Emergency Department (HOSPITAL_COMMUNITY)
Admission: EM | Admit: 2023-07-17 | Discharge: 2023-07-17 | Disposition: A | Discharge status: Home / Self Care | Attending: Emergency Medicine | Admitting: Emergency Medicine

## 2023-07-17 ENCOUNTER — Other Ambulatory Visit: Payer: Self-pay

## 2023-07-17 ENCOUNTER — Emergency Department (HOSPITAL_COMMUNITY)

## 2023-07-17 ENCOUNTER — Encounter (HOSPITAL_COMMUNITY): Payer: Self-pay

## 2023-07-17 DIAGNOSIS — E039 Hypothyroidism, unspecified: Secondary | ICD-10-CM | POA: Diagnosis not present

## 2023-07-17 DIAGNOSIS — Z7901 Long term (current) use of anticoagulants: Secondary | ICD-10-CM | POA: Insufficient documentation

## 2023-07-17 DIAGNOSIS — Z853 Personal history of malignant neoplasm of breast: Secondary | ICD-10-CM | POA: Insufficient documentation

## 2023-07-17 DIAGNOSIS — R112 Nausea with vomiting, unspecified: Secondary | ICD-10-CM

## 2023-07-17 DIAGNOSIS — K5732 Diverticulitis of large intestine without perforation or abscess without bleeding: Secondary | ICD-10-CM | POA: Insufficient documentation

## 2023-07-17 DIAGNOSIS — I10 Essential (primary) hypertension: Secondary | ICD-10-CM | POA: Insufficient documentation

## 2023-07-17 DIAGNOSIS — Z79899 Other long term (current) drug therapy: Secondary | ICD-10-CM | POA: Diagnosis not present

## 2023-07-17 DIAGNOSIS — K5792 Diverticulitis of intestine, part unspecified, without perforation or abscess without bleeding: Secondary | ICD-10-CM

## 2023-07-17 DIAGNOSIS — R1084 Generalized abdominal pain: Secondary | ICD-10-CM | POA: Diagnosis present

## 2023-07-17 DIAGNOSIS — Z91040 Latex allergy status: Secondary | ICD-10-CM | POA: Insufficient documentation

## 2023-07-17 LAB — URINALYSIS, W/ REFLEX TO CULTURE (INFECTION SUSPECTED)
Bacteria, UA: NONE SEEN
Bilirubin Urine: NEGATIVE
Glucose, UA: NEGATIVE mg/dL
Hgb urine dipstick: NEGATIVE
Ketones, ur: 5 mg/dL — AB
Leukocytes,Ua: NEGATIVE
Nitrite: NEGATIVE
Protein, ur: NEGATIVE mg/dL
Specific Gravity, Urine: 1.008 (ref 1.005–1.030)
pH: 6 (ref 5.0–8.0)

## 2023-07-17 LAB — CBC WITH DIFFERENTIAL/PLATELET
Abs Immature Granulocytes: 0.04 10*3/uL (ref 0.00–0.07)
Basophils Absolute: 0 10*3/uL (ref 0.0–0.1)
Basophils Relative: 0 %
Eosinophils Absolute: 0 10*3/uL (ref 0.0–0.5)
Eosinophils Relative: 0 %
HCT: 46.4 % — ABNORMAL HIGH (ref 36.0–46.0)
Hemoglobin: 15.4 g/dL — ABNORMAL HIGH (ref 12.0–15.0)
Immature Granulocytes: 0 %
Lymphocytes Relative: 8 %
Lymphs Abs: 0.9 10*3/uL (ref 0.7–4.0)
MCH: 29.5 pg (ref 26.0–34.0)
MCHC: 33.2 g/dL (ref 30.0–36.0)
MCV: 88.9 fL (ref 80.0–100.0)
Monocytes Absolute: 0.7 10*3/uL (ref 0.1–1.0)
Monocytes Relative: 6 %
Neutro Abs: 9.4 10*3/uL — ABNORMAL HIGH (ref 1.7–7.7)
Neutrophils Relative %: 86 %
Platelets: 218 10*3/uL (ref 150–400)
RBC: 5.22 MIL/uL — ABNORMAL HIGH (ref 3.87–5.11)
RDW: 14.5 % (ref 11.5–15.5)
WBC: 11.1 10*3/uL — ABNORMAL HIGH (ref 4.0–10.5)
nRBC: 0 % (ref 0.0–0.2)

## 2023-07-17 LAB — COMPREHENSIVE METABOLIC PANEL WITH GFR
ALT: 13 U/L (ref 0–44)
AST: 16 U/L (ref 15–41)
Albumin: 3.5 g/dL (ref 3.5–5.0)
Alkaline Phosphatase: 52 U/L (ref 38–126)
Anion gap: 8 (ref 5–15)
BUN: 7 mg/dL — ABNORMAL LOW (ref 8–23)
CO2: 27 mmol/L (ref 22–32)
Calcium: 8.6 mg/dL — ABNORMAL LOW (ref 8.9–10.3)
Chloride: 105 mmol/L (ref 98–111)
Creatinine, Ser: 0.6 mg/dL (ref 0.44–1.00)
GFR, Estimated: >60 mL/min (ref >60)
Glucose, Bld: 109 mg/dL — ABNORMAL HIGH (ref 70–99)
Potassium: 3.9 mmol/L (ref 3.5–5.1)
Sodium: 140 mmol/L (ref 135–145)
Total Bilirubin: 0.9 mg/dL (ref 0.0–1.2)
Total Protein: 6.6 g/dL (ref 6.5–8.1)

## 2023-07-17 LAB — LACTIC ACID, PLASMA: Lactic Acid, Venous: 1.6 mmol/L (ref 0.5–1.9)

## 2023-07-17 LAB — LIPASE, BLOOD: Lipase: 21 U/L (ref 11–51)

## 2023-07-17 MED ORDER — AMOXICILLIN-POT CLAVULANATE 875-125 MG PO TABS: 1.0000 | ORAL_TABLET | Freq: Two times a day (BID) | ORAL | 0 refills | Status: AC

## 2023-07-17 MED ORDER — ONDANSETRON HCL 4 MG/2ML IJ SOLN
4.0000 mg | Freq: Once | INTRAMUSCULAR | Status: AC
  Administered 2023-07-17: 4 mg via INTRAVENOUS
  Filled 2023-07-17: qty 2

## 2023-07-17 MED ORDER — IOHEXOL 300 MG/ML  SOLN
100.0000 mL | Freq: Once | INTRAMUSCULAR | Status: AC | PRN
  Administered 2023-07-17: 100 mL via INTRAVENOUS

## 2023-07-17 MED ORDER — MORPHINE SULFATE (PF) 4 MG/ML IV SOLN
4.0000 mg | Freq: Once | INTRAVENOUS | Status: AC
  Administered 2023-07-17: 4 mg via INTRAVENOUS
  Filled 2023-07-17: qty 1

## 2023-07-17 MED ORDER — SODIUM CHLORIDE 0.9 % IV BOLUS
1000.0000 mL | Freq: Once | INTRAVENOUS | Status: AC
  Administered 2023-07-17: 1000 mL via INTRAVENOUS

## 2023-07-17 MED ORDER — ONDANSETRON 4 MG PO TBDP: 4.0000 mg | ORAL_TABLET | Freq: Three times a day (TID) | ORAL | 0 refills | Status: AC | PRN

## 2023-07-17 MED ORDER — AMOXICILLIN-POT CLAVULANATE 875-125 MG PO TABS
1.0000 | ORAL_TABLET | Freq: Once | ORAL | Status: AC
  Administered 2023-07-17: 1 via ORAL
  Filled 2023-07-17: qty 1

## 2023-07-17 MED ORDER — OXYCODONE-ACETAMINOPHEN 5-325 MG PO TABS: 1.0000 | ORAL_TABLET | Freq: Four times a day (QID) | ORAL | 0 refills | Status: AC | PRN

## 2023-07-17 NOTE — ED Triage Notes (Signed)
 Pt arrived reporting abdominal pain, lower that started this morning. Patient states she thought it was gas but took gas-x with no relief. Also states has had a few loose stools with morning, with no blood noted . Intermittent vomiting

## 2023-07-17 NOTE — ED Provider Notes (Signed)
 [facility] EMERGENCY DEPARTMENT AT [facility] Provider Note   CSN: [id number] Arrival date & time: 07/17/23  1526     History  Chief Complaint  Patient presents with   Abdominal Pain   Nausea    Kellie Reynolds is a 68 y.o. female.  The history is provided by the patient and medical records. No language interpreter was used.  Abdominal Pain Pain location:  Generalized Pain quality: aching and cramping   Pain radiates to:  Does not radiate Pain severity:  Moderate Onset quality:  Gradual Timing:  Constant Progression:  Waxing and waning Chronicity:  New Context: suspicious food intake   Context: not previous surgeries, not recent illness, not sick contacts and not trauma   Relieved by:  Nothing Worsened by:  Nothing Associated symptoms: diarrhea, nausea and vomiting   Associated symptoms: no anorexia, no chest pain, no chills, no constipation, no cough, no dysuria, no fatigue, no fever, no flatus and no shortness of breath   Nausea, vomiting, loose stools, severe abdominal pain.     Home Medications Prior to Admission medications   Medication Sig Start Date End Date Taking? Authorizing Provider  acetaminophen  (TYLENOL ) 500 MG tablet Take 1,500 mg by mouth 3 (three) times daily as needed for headache.    [provider]  alendronate (FOSAMAX) 70 MG tablet Take 70 mg by mouth once a week. 05/04/21   [provider]  atorvastatin  (LIPITOR) 20 MG tablet Take 20 mg by mouth daily. 03/21/23   [provider]  citalopram (CELEXA) 10 MG tablet Take 10 mg by mouth daily. 12/28/18   [provider]  ferrous sulfate 325 (65 FE) MG tablet Take 325 mg by mouth daily. 10/05/21   [provider]  flecainide  (TAMBOCOR ) 100 MG tablet Take 50 mg by mouth 2 (two) times daily. 03/24/23   [provider]  guaiFENesin (MUCINEX) 600 MG 12 hr tablet Take 600 mg by mouth daily as needed (allergies).    [provider]   letrozole (FEMARA) 2.5 MG tablet Take 2.5 mg by mouth daily. 04/24/21   [provider]  methimazole  (TAPAZOLE ) 5 MG tablet Take 0.5 tablets (2.5 mg total) by mouth daily. 03/30/23   Shamleffer, Ibtehal Jaralla, MD  metoprolol  tartrate (LOPRESSOR ) 50 MG tablet Take 1 tablet (50 mg total) by mouth every morning AND 0.5 tablets (25 mg total) every evening. 04/08/23   Carie Charity, NP  omeprazole  (PRILOSEC) 40 MG capsule TAKE 1 CAPSULE BY MOUTH 2 (TWO) TIMES DAILY 20-30 MINUTES BEFORE MEALS. 03/01/23   Elois Hair, MD  sodium chloride  (OCEAN) 0.65 % nasal spray Place 1 spray into the nose 2 (two) times daily as needed. For dryness    [provider]  XARELTO  20 MG TABS tablet TAKE 1 TABLET BY MOUTH DAILY WITH SUPPER. 11/16/22   Hugh Madura, MD      Allergies    Anastrozole and Latex    Review of Systems   Review of Systems  Constitutional:  Negative for chills, fatigue and fever.  HENT:  Negative for congestion.   Respiratory:  Negative for cough, chest tightness and shortness of breath.   Cardiovascular:  Negative for chest pain, palpitations and leg swelling.  Gastrointestinal:  Positive for abdominal pain, diarrhea, nausea and vomiting. Negative for abdominal distention, anorexia, constipation and flatus.  Genitourinary:  Negative for dysuria, flank pain and frequency.  Musculoskeletal:  Negative for back pain, neck pain and neck stiffness.  Skin:  Negative for rash and wound.  Neurological:  Negative for light-headedness and headaches.  Psychiatric/Behavioral:  Negative for agitation and confusion.   All other systems reviewed and are negative.   Physical Exam Updated Vital Signs BP (!) 164/102   Pulse 94   Temp 99.2 F (37.3 C) (Oral)   Resp 18   Ht 5\' 6"  (1.676 m)   Wt 68.9 kg   SpO2 99%   BMI 24.52 kg/m  Physical Exam Vitals and nursing note reviewed.  Constitutional:      General: She is not in acute distress.    Appearance: She is  well-developed. She is not ill-appearing, toxic-appearing or diaphoretic.  HENT:     Head: Normocephalic and atraumatic.  Eyes:     Conjunctiva/sclera: Conjunctivae normal.  Cardiovascular:     Rate and Rhythm: Normal rate and regular rhythm.     Heart sounds: No murmur heard. Pulmonary:     Effort: Pulmonary effort is normal. No respiratory distress.     Breath sounds: Normal breath sounds.  Abdominal:     General: Abdomen is flat. Bowel sounds are normal.     Palpations: Abdomen is soft.     Tenderness: There is generalized abdominal tenderness. There is no right CVA tenderness or left CVA tenderness.  Musculoskeletal:        General: No swelling.     Cervical back: Neck supple.  Skin:    General: Skin is warm and dry.     Capillary Refill: Capillary refill takes less than 2 seconds.     Coloration: Skin is not pale.     Findings: No rash.  Neurological:     General: No focal deficit present.     Mental Status: She is alert.  Psychiatric:        Mood and Affect: Mood normal.     ED Results / Procedures / Treatments   Labs (all labs ordered are listed, but only abnormal results are displayed) Labs Reviewed  CBC WITH DIFFERENTIAL/PLATELET - Abnormal; Notable for the following components:      Result Value   WBC 11.1 (*)    RBC 5.22 (*)    Hemoglobin 15.4 (*)    HCT 46.4 (*)    Neutro Abs 9.4 (*)    All other components within normal limits  URINALYSIS, W/ REFLEX TO CULTURE (INFECTION SUSPECTED) - Abnormal; Notable for the following components:   Ketones, ur 5 (*)    All other components within normal limits  COMPREHENSIVE METABOLIC PANEL WITH GFR - Abnormal; Notable for the following components:   Glucose, Bld 109 (*)    BUN 7 (*)    Calcium  8.6 (*)    All other components within normal limits  LACTIC ACID, PLASMA  LIPASE, BLOOD    EKG None  Radiology CT ABDOMEN PELVIS W CONTRAST Result Date: 07/17/2023 CLINICAL DATA:  Abdominal pain. EXAM: CT ABDOMEN AND  PELVIS WITH CONTRAST TECHNIQUE: Multidetector CT imaging of the abdomen and pelvis was performed using the standard protocol following bolus administration of intravenous contrast. RADIATION DOSE REDUCTION: This exam was performed according to the departmental dose-optimization program which includes automated exposure control, adjustment of the mA and/or kV according to patient size and/or use of iterative reconstruction technique. CONTRAST:  100mL OMNIPAQUE IOHEXOL 300 MG/ML  SOLN COMPARISON:  CT abdomen pelvis dated 12/06/2022. FINDINGS: Lower chest: The visualized lung bases are clear. No intra-abdominal free air or free fluid. Hepatobiliary: No focal liver abnormality is seen. No gallstones, gallbladder wall  thickening, or biliary dilatation. Pancreas: Unremarkable. No pancreatic ductal dilatation or surrounding inflammatory changes. Spleen: Normal in size without focal abnormality. Adrenals/Urinary Tract: The adrenal glands unremarkable. The kidneys, visualized ureters, and urinary bladder appear unremarkable. Stomach/Bowel: There is sigmoid diverticulosis. There is thickening of the sigmoid colon with perisigmoid haziness. Mild diffuse thickened appearance of the colon may be related to underdistention or colitis. There is no bowel obstruction. The appendix is normal. Vascular/Lymphatic: Mild aortoiliac atherosclerotic disease. The IVC is unremarkable. No portal venous gas. There is no adenopathy. Reproductive: The uterus is anteverted and grossly unremarkable. No suspicious adnexal masses. Other: None Musculoskeletal: Osteopenia with scoliosis and degenerative changes of spine. No acute osseous pathology. IMPRESSION: 1. Mild colitis versus sigmoid diverticulitis. Clinical correlation recommended. No abscess or perforation. 2. No bowel obstruction. Normal appendix. 3.  Aortic Atherosclerosis (ICD10-I70.0). Electronically Signed   By: Angus Bark M.D.   On: 07/17/2023 21:46    Procedures Procedures     Medications Ordered in ED Medications  amoxicillin-clavulanate (AUGMENTIN) 875-125 MG per tablet 1 tablet (has no administration in time range)  ondansetron  (ZOFRAN ) injection 4 mg (4 mg Intravenous Given 07/17/23 1659)  morphine  (PF) 4 MG/ML injection 4 mg (4 mg Intravenous Given 07/17/23 1700)  sodium chloride  0.9 % bolus 1,000 mL (1,000 mLs Intravenous Bolus 07/17/23 1700)  iohexol (OMNIPAQUE) 300 MG/ML solution 100 mL (100 mLs Intravenous Contrast Given 07/17/23 2121)    ED Course/ Medical Decision Making/ A&P                                 Medical Decision Making Amount and/or Complexity of Data Reviewed Labs: ordered. Radiology: ordered.  Risk Prescription drug management.    Kellie Reynolds is a 68 y.o. female with a past medical history significant for atrial fibrillation on Xarelto  therapy, GERD, hypertension, hypercholesterolemia, breast cancer, and hypothyroidism who presents with nausea, vomiting, loose stools, and abdominal pain.  According to patient, for the last 2 days, she has not been feeling well and it worsened today with severe abdominal pain, nausea, vomiting, and loose stools.  She does report she ate some suspicious macaroni salad several days ago but the other person who ate it did not get sick.  She reports no fevers or chills but has had dry heaving all day today.  She reports no urinary changes.  No dysuria.  She denies any trauma.  She denies any chest pain, shortness of breath.  She reports no acute cough.  She reports some mild congestion with the pollen but that has not been bothering her.  She denies any other sick contacts.  On exam, lungs clear.  Chest nontender.  Abdomen diffusely tender.  Bowel sounds were appreciated.  Flanks and back nontender.  No rashes.  Oropharyngeal exam showed right mucous membranes but otherwise no acute abnormality.  Patient has a near fever on arrival but is not tachycardic or tachypneic.  She is not hypotensive.  Clinically  I suspect patient has either food poisoning versus viral gastroenteritis however with the amount of tenderness and pain she is describing with her dry heaving and vomiting and her age, we will get CT imaging to rule out other concerning etiology.  She has no history of abdominal surgery nor she has any history of diverticulitis gallbladder disease, or appendicitis.  We will give her some pain medicine, nausea resting, and fluids and will wait for diagnostic labs and imaging.  If  workup reassuring, will likely try GI cocktail as well.  Anticipate reassessment after workup to determine disposition.     10:03 PM CT scan revealed acute diverticulitis.  Patient does say she had some sesame seeds on a snack today and thinks that may have done it.  She will get dose of antibiotics and will go home with prescription for antibiotics, pain medicine, nausea medicine.  She will follow-up with the PCP.  She was feeling better and vital signs are reassuring.  She had no other questions or concerns and will be discharged for outpatient follow-up.  She understood return precautions.         Final Clinical Impression(s) / ED Diagnoses Final diagnoses:  Diverticulitis  Generalized abdominal pain  Nausea and vomiting, unspecified vomiting type    Rx / DC Orders ED Discharge Orders          Ordered    amoxicillin-clavulanate (AUGMENTIN) 875-125 MG tablet  Every 12 hours        07/17/23 2206    oxyCODONE-acetaminophen  (PERCOCET/ROXICET) 5-325 MG tablet  Every 6 hours PRN        07/17/23 2206    ondansetron  (ZOFRAN -ODT) 4 MG disintegrating tablet  Every 8 hours PRN        07/17/23 2206            Clinical Impression: 1. Diverticulitis   2. Generalized abdominal pain   3. Nausea and vomiting, unspecified vomiting type     Disposition: Discharge  Condition: Good  I have discussed the results, Dx and Tx plan with the pt(& family if present). He/she/they expressed understanding and agree(s)  with the plan. Discharge instructions discussed at great length. Strict return precautions discussed and pt &/or family have verbalized understanding of the instructions. No further questions at time of discharge.    New Prescriptions   AMOXICILLIN-CLAVULANATE (AUGMENTIN) 875-125 MG TABLET    Take 1 tablet by mouth every 12 (twelve) hours for 10 days.   ONDANSETRON  (ZOFRAN -ODT) 4 MG DISINTEGRATING TABLET    Take 1 tablet (4 mg total) by mouth every 8 (eight) hours as needed for nausea or vomiting.   OXYCODONE-ACETAMINOPHEN  (PERCOCET/ROXICET) 5-325 MG TABLET    Take 1 tablet by mouth every 6 (six) hours as needed for severe pain (pain score 7-10).    Follow Up: [facility] [address] [place] [address] [phone]     [facility] Emergency Department at [facility] [address] [place]  [address] [phone]       Mikael Debell, Marine Sia, MD 07/17/23 [phone]

## 2023-07-17 NOTE — Discharge Instructions (Signed)
 Your history, exam, workup today revealed diverticulitis as the likely cause of your symptoms.  The other workup was overall reassuring as we discussed.  Please use the antibiotics, pain medicine, and nausea medicine to help with your symptoms and follow-up with your primary doctor.  Please rest and stay hydrated.  If any symptoms change or worsen acutely, please return to the nearest emergency department.

## 2023-07-27 ENCOUNTER — Other Ambulatory Visit: Payer: Self-pay | Admitting: Gastroenterology

## 2023-09-15 ENCOUNTER — Other Ambulatory Visit: Payer: Self-pay | Admitting: General Practice

## 2023-10-03 ENCOUNTER — Ambulatory Visit: Payer: Medicare HMO | Admitting: Internal Medicine

## 2023-10-03 ENCOUNTER — Encounter: Payer: Self-pay | Admitting: Internal Medicine

## 2023-10-03 VITALS — BP 120/80 | HR 55 | Ht 66.0 in | Wt 145.0 lb

## 2023-10-03 DIAGNOSIS — E042 Nontoxic multinodular goiter: Secondary | ICD-10-CM

## 2023-10-03 DIAGNOSIS — E0590 Thyrotoxicosis, unspecified without thyrotoxic crisis or storm: Secondary | ICD-10-CM

## 2023-10-03 NOTE — Progress Notes (Unsigned)
 Name: Kellie Reynolds  MRN/ DOB: [id number], [birth year]    Age/ Sex: 68 y.o., female    PCP: [facility]   Reason for Endocrinology Evaluation: MNG     Date of Initial Endocrinology Evaluation: 06/18/2021    HPI: Kellie Reynolds is a 68 y.o. female with a past medical history of MNG, A.Fib, osteoporosis and DM, Hx of breast Ca ( Left lumpectomy . The patient presented for initial endocrinology clinic visit on 06/18/2021 for consultative assistance with her Subclinical Hyperthyroidism and MNG.   Patient states that she has had diarrhea since 2020 which was started following radiation treatment for left breast cancer.  During evaluation for causes of diarrhea the patient has been noted to have multinodular goiter   No FH of thyroid  disease    In review of her chart she had a Hx of thyroid  uptake and scan in 2008 with a 24-hr uptake at 12% . Thyroid  uptake and scan 03/2021 showed subtle areas of decrease tracer uptake at the lateral aspect of mid and inferior right lobe   On her initial visit to our clinic she had an TSH 0.31 uIU/mL with normal T4 and T3.  We started methimazole  2.5 mg daily (06/2021)  No prior use of Amiodarone   SUBJECTIVE:    Today (10/04/23):  Kellie Reynolds is here for follow-up of multinodular goiter and hyperthyroidism   Patient has been noted with weight loss Denies local neck swelling  Stable  occasional  chronic palpitations, A. Fib stable ( S/P cardioversion ) Denies constipation or  diarrhea  Denies tremors  No biotin intake   Methimazole  5 mg, half a tablet except Sunday    HISTORY:  Past Medical History:  Past Medical History:  Diagnosis Date   Arthritis    right knee   Breast CA (HCC) 2022   left   Family history of adverse reaction to anesthesia 35 yrs ago   daughter had malignant hyperthermia reaction with acentine, daughter has had surgeries since then and did welll   Fibrocystic breast disease    GERD  (gastroesophageal reflux disease)    HTN (hypertension)    Hypercholesteremia    IDA (iron deficiency anemia)    MVP (mitral valve prolapse)    Paroxysmal atrial fibrillation (HCC)    a. s/p PVI   Skin cancer    Sleep apnea    no longer use CPAP   Past Surgical History:  Past Surgical History:  Procedure Laterality Date   ATRIAL FIBRILLATION ABLATION N/A 04/18/2012   PVI by Allred   BREAST LUMPECTOMY  2022   COLONOSCOPY WITH PROPOFOL  N/A 11/24/2015   Procedure: COLONOSCOPY WITH PROPOFOL ;  Surgeon: Gladis MARLA Louder, MD;  Location: [facility] ENDOSCOPY;  Service: Endoscopy;  Laterality: N/A;   EYE SURGERY Bilateral    lasix eye surgery both eyes   MENISECTOMY     Right knee   NASAL POLYP SURGERY  1990   NASAL SINUS SURGERY  1990   TEE WITHOUT CARDIOVERSION  04/17/2012   Procedure: TRANSESOPHAGEAL ECHOCARDIOGRAM (TEE);  Surgeon: Candyce CANDIE Reek, MD;  Location: [facility] ENDOSCOPY;  Service: Cardiovascular;  Laterality: N/A;  pre ablation   TUBAL LIGATION      Social History:  reports that she has been smoking cigarettes. She has a 100 pack-year smoking history. She has never used smokeless tobacco. She reports current alcohol use. She reports that she does not use drugs. Family History: family history includes CAD in her father;  Diabetes in her mother; Heart attack in her father; Hyperlipidemia in her father; Hypertension in her father; Leukemia in her maternal grandfather; Liver cancer in her maternal grandmother.   HOME MEDICATIONS: Allergies as of 10/03/2023       Reactions   Anastrozole Other (See Comments)   unknown   Latex Itching, Rash, Other (See Comments)   Elastic in underwear        Medication List        Accurate as of October 03, 2023 11:59 PM. If you have any questions, ask your nurse or doctor.          acetaminophen  500 MG tablet Commonly known as: TYLENOL  Take 1,500 mg by mouth 3 (three) times daily as needed for headache.   alendronate 70 MG  tablet Commonly known as: FOSAMAX Take 70 mg by mouth once a week.   atorvastatin  20 MG tablet Commonly known as: LIPITOR Take 20 mg by mouth daily.   citalopram 10 MG tablet Commonly known as: CELEXA Take 10 mg by mouth daily.   ferrous sulfate 325 (65 FE) MG tablet Take 325 mg by mouth daily.   flecainide  100 MG tablet Commonly known as: TAMBOCOR  Take 50 mg by mouth 2 (two) times daily.   guaiFENesin 600 MG 12 hr tablet Commonly known as: MUCINEX Take 600 mg by mouth daily as needed (allergies).   letrozole 2.5 MG tablet Commonly known as: FEMARA Take 2.5 mg by mouth daily.   losartan 50 MG tablet Commonly known as: COZAAR Take 50 mg by mouth 2 (two) times daily.   methimazole  5 MG tablet Commonly known as: TAPAZOLE  Take 0.5 tablets (2.5 mg total) by mouth daily. What changed: additional instructions   metoprolol  tartrate 50 MG tablet Commonly known as: LOPRESSOR  TAKE 1 TABLET (50 MG TOTAL) BY MOUTH EVERY MORNING AND 1/2 TABLETS (25 MG TOTAL) EVERY EVENING.   omeprazole  40 MG capsule Commonly known as: PRILOSEC TAKE 1 CAPSULE BY MOUTH 2 (TWO) TIMES DAILY 20-30 MINUTES BEFORE MEALS.   ondansetron  4 MG disintegrating tablet Commonly known as: ZOFRAN -ODT Take 1 tablet (4 mg total) by mouth every 8 (eight) hours as needed for nausea or vomiting.   oxyCODONE -acetaminophen  5-325 MG tablet Commonly known as: PERCOCET/ROXICET Take 1 tablet by mouth every 6 (six) hours as needed for severe pain (pain score 7-10).   sodium chloride  0.65 % nasal spray Commonly known as: OCEAN Place 1 spray into the nose 2 (two) times daily as needed. For dryness   Xarelto  20 MG Tabs tablet Generic drug: rivaroxaban  TAKE 1 TABLET BY MOUTH DAILY WITH SUPPER.          REVIEW OF SYSTEMS: A comprehensive ROS was conducted with the patient and is negative except as per HPI   OBJECTIVE:  VS: BP 120/80 (BP Location: Left Arm, Patient Position: Sitting, Cuff Size: Normal)   Pulse  (!) 55   Ht 5' 6 (1.676 m)   Wt 145 lb (65.8 kg)   SpO2 98%   BMI 23.40 kg/m    Wt Readings from Last 3 Encounters:  10/03/23 145 lb (65.8 kg)  07/17/23 151 lb 14.4 oz (68.9 kg)  05/04/23 153 lb 14.4 oz (69.8 kg)     EXAM: General: Pt appears well and is in NAD  Neck: General: Supple without adenopathy. Thyroid : Thyroid  size normal.  No goiter or nodules appreciated.   Lungs: Clear with good BS bilat with no rales, rhonchi, or wheezes  Heart: Auscultation: RRR.  Extremities:  BL  LE: No pretibial edema   Mental Status: Judgment, insight: Intact Orientation: Oriented to time, place, and person Mood and affect: No depression, anxiety, or agitation     DATA REVIEWED:  Latest Reference Range & Units 10/03/23 10:16  WBC 3.8 - 10.8 Thousand/uL 8.2  RBC 3.80 - 5.10 Million/uL 4.31  Hemoglobin 11.7 - 15.5 g/dL 86.3  HCT 64.9 - 54.9 % 40.5  MCV 80.0 - 100.0 fL 94.0  MCH 27.0 - 33.0 pg 31.6  MCHC 32.0 - 36.0 g/dL 66.3  RDW 88.9 - 84.9 % 12.7  Platelets 140 - 400 Thousand/uL 191  MPV 7.5 - 12.5 fL 12.0    Latest Reference Range & Units 10/03/23 10:16  TSH 0.40 - 4.50 mIU/L 0.67  T4,Free(Direct) 0.8 - 1.8 ng/dL 0.8      Latest Reference Range & Units 03/26/22 10:00  TRAB <=2.00 IU/L 1.15     Thyroid  ultrasound 04/13/2023  Estimated total number of nodules >/= 1 cm: 3   Number of spongiform nodules >/=  2 cm not described below (TR1): 0   Number of mixed cystic and solid nodules >/= 1.5 cm not described below (TR2): 0   _________________________________________________________   Enlarged, heterogeneous thyroid  gland containing multiple cysts and nodules. Many of the lesions measure less than 1 cm or otherwise demonstrate overtly benign features and do not warrant further consideration.   Nodule # 2: Small hypoechoic solid nodule in the right mid gland demonstrates internal dystrophic calcifications and remains stable in size and appearance at 1.1 x 1.0 x 1.0  cm. Findings remain consistent with TI-RADS category 4. *Given size (>/= 1 - 1.4 cm) and appearance, a follow-up ultrasound in 1 year should be considered based on TI-RADS criteria.   Nodule # 3: Interval increase in cystic degeneration of this lesion which now has a spongiform appearance. This appearance is sonographically low risk/benign. No further follow-up.   Nodule # 4: Stable appearance of spongiform nodule in the right lower gland at 1.3 cm. No further follow-up.   IMPRESSION: 1. Continued stability of nodule # 2 in the right mid gland this TI-RADS category 4 nodule continues to meet criteria for imaging surveillance. The present exam confirms 2 years of stability. Recommend follow-up ultrasound annually until 5 years of stability is confirmed. 2. Interval cystic degeneration of nodule # 3 which now has a sonographically benign/low risk spongiform appearance. This lesion no longer meets criteria for further surveillance. 3. No new nodules or suspicious features.    Thyroid  Uptake 03/26/2021    FINDINGS: Homogeneous tracer distribution LEFT thyroid  lobe.   Subtle areas of decreased tracer uptake at lateral aspect of mid and inferior RIGHT thyroid  lobe; these correspond to nodules identified on ultrasound.   No additional areas of increased or decreased tracer uptake seen.   4 hour I-123 uptake = 10.1% (normal 5-20%)   24 hour I-123 uptake = 25.5% (normal 10-30%)   IMPRESSION: Normal 4 hour and 24 hour radio iodine uptakes.   Subtle cold nodules at the lateral aspect of the mid and inferior RIGHT thyroid  lobe, corresponding to nodules identified by thyroid  ultrasound; please see recommendations of recent thyroid  ultrasound.     ASSESSMENT/PLAN/RECOMMENDATIONS:   MNG  -No local neck symptoms -Thyroid  uptake and scan shows subtle cold nodules at the lateral aspect of the mid and the inferior right thyroid  lobe but based on thyroid  ultrasound none of these  nodules met criteria for FNA  -Repeat ultrasound 03/2022  and 03/2023 shows stability of the  nodules    2.  Hyperthyroidism :   - Patient is clinically euthyroid -We have opted to treat with methimazole  given personal history of atrial fibrillation and the importance of keeping thyroid  levels at normal level to prevent any cardiac arrhythmias - TFTs have normalized, no change at this time   Medication Continue methimazole  5 mg, half a tablet daily   F/U in 6 months    Signed electronically by: Stefano Redgie Butts, MD  [facility] Endocrinology  [facility] [address] [place] [place], [place] [address] Phone: [phone] FAX: [phone]   CC: [facility] [address] [place] [address] Phone: [phone] Fax: [phone]   Return to Endocrinology clinic as below: Future Appointments  Date Time Provider Department Center  11/02/2023 10:30 AM [facility] None  11/02/2023 11:00 AM Federico Norleen ONEIDA MADISON, MD [facility] None  04/04/2024 10:10 AM Aesha Agrawal, Donell Redgie, MD [facility] None

## 2023-10-04 ENCOUNTER — Ambulatory Visit: Payer: Self-pay | Admitting: Internal Medicine

## 2023-10-04 LAB — CBC
HCT: 40.5 % (ref 35.0–45.0)
Hemoglobin: 13.6 g/dL (ref 11.7–15.5)
MCH: 31.6 pg (ref 27.0–33.0)
MCHC: 33.6 g/dL (ref 32.0–36.0)
MCV: 94 fL (ref 80.0–100.0)
MPV: 12 fL (ref 7.5–12.5)
Platelets: 191 Thousand/uL (ref 140–400)
RBC: 4.31 Million/uL (ref 3.80–5.10)
RDW: 12.7 % (ref 11.0–15.0)
WBC: 8.2 Thousand/uL (ref 3.8–10.8)

## 2023-10-04 LAB — TSH: TSH: 0.67 m[IU]/L (ref 0.40–4.50)

## 2023-10-04 LAB — T4, FREE: Free T4: 0.8 ng/dL (ref 0.8–1.8)

## 2023-10-04 MED ORDER — METHIMAZOLE 5 MG PO TABS: 2.5000 mg | ORAL_TABLET | Freq: Every day | ORAL | 3 refills | Status: DC

## 2023-10-26 ENCOUNTER — Other Ambulatory Visit: Payer: Self-pay | Admitting: Gastroenterology

## 2023-11-01 ENCOUNTER — Other Ambulatory Visit: Payer: Self-pay | Admitting: Hematology and Oncology

## 2023-11-01 DIAGNOSIS — D500 Iron deficiency anemia secondary to blood loss (chronic): Secondary | ICD-10-CM

## 2023-11-01 NOTE — Progress Notes (Unsigned)
 [facility] [phone]   [phone]) [phone]  PROGRESS NOTE  Patient Care Team: [facility] as PCP - General Kellie Oneil BROCKS, MD as PCP - Cardiology (Cardiology)  Hematological/Oncological History -10/03/2021: Hgb 7.8. MCV 75.4 -10/23/2021: Hgb 8.6, MCV 75.1, Iron 25, Ferritin 15.6, Saturation 5.1%.  -11/18/2021: Establish care with [facility]ology with Dr. Norleen Reynolds and Kellie Reynolds  CHIEF COMPLAINTS/PURPOSE OF CONSULTATION:  Iron deficiency anemia H/O breast cancer  HISTORY OF PRESENTING ILLNESS:  Kellie Reynolds 68 y.o. female returns for a follow up for iron deficiency anemia and history of breast cancer. She is unaccompanied for this visit.   On exam today, [name] reports she has been well overall in the interim since her last visit.  She reports her appetite is so-so though her energy is 3 out of 10.  She reports that she has stopped multiple pills including her letrozole , Xarelto , and iron pills.  She reports that she simply ran out of the iron pills and stopped taking them.  She notes that she was told to stop her flecainide  by cardiology.  She notes that she stopped letrozole  because she did not wish to ask for refill.  She notes that she has had no lightheadedness, dizziness, or shortness of breath.  She notes that she does enjoy iron rich foods such as green leafy vegetables.  She notes that she does have some runny nose on occasion from pollen and is having some right now.  She otherwise denies any fevers, chills, sweats.  Full 10 point ROS is otherwise negative.  MEDICAL HISTORY:  Past Medical History:  Diagnosis Date   Arthritis    right knee   Breast CA (HCC) 2022   left   Family history of adverse reaction to anesthesia 35 yrs ago   daughter had malignant hyperthermia reaction with acentine, daughter has had surgeries since then and did welll   Fibrocystic breast disease    GERD (gastroesophageal reflux disease)     HTN (hypertension)    Hypercholesteremia    IDA (iron deficiency anemia)    MVP (mitral valve prolapse)    Paroxysmal atrial fibrillation (HCC)    a. s/p PVI   Skin cancer    Sleep apnea    no longer use CPAP    SURGICAL HISTORY: Past Surgical History:  Procedure Laterality Date   ATRIAL FIBRILLATION ABLATION N/A 04/18/2012   PVI by Allred   BREAST LUMPECTOMY  2022   COLONOSCOPY WITH PROPOFOL  N/A 11/24/2015   Procedure: COLONOSCOPY WITH PROPOFOL ;  Surgeon: Gladis MARLA Louder, MD;  Location: [facility] ENDOSCOPY;  Service: Endoscopy;  Laterality: N/A;   EYE SURGERY Bilateral    lasix eye surgery both eyes   MENISECTOMY     Right knee   NASAL POLYP SURGERY  1990   NASAL SINUS SURGERY  1990   TEE WITHOUT CARDIOVERSION  04/17/2012   Procedure: TRANSESOPHAGEAL ECHOCARDIOGRAM (TEE);  Surgeon: Kellie CANDIE Reek, MD;  Location: [facility] ENDOSCOPY;  Service: Cardiovascular;  Laterality: N/A;  pre ablation   TUBAL LIGATION      SOCIAL HISTORY: Social History   Socioeconomic History   Marital status: Divorced    Spouse name: Not on file   Number of children: 1   Years of education: Not on file   Highest education level: Not on file  Occupational History   Not on file  Tobacco Use   Smoking status: Every Day    Current packs/day: 2.00  Average packs/day: 2.0 packs/day for 50.0 years (100.0 ttl pk-yrs)    Types: Cigarettes   Smokeless tobacco: Never  Vaping Use   Vaping status: Never Used  Substance and Sexual Activity   Alcohol use: Yes    Comment: 4 beers oper day, regular sized beers   Drug use: No   Sexual activity: Not on file  Other Topics Concern   Not on file  Social History Narrative   Not on file   Social Drivers of Health   Financial Resource Strain: Not on file  Food Insecurity: Not on file  Transportation Needs: Not on file  Physical Activity: Not on file  Stress: Not on file  Social Connections: Not on file  Intimate Partner Violence: Not on file     FAMILY HISTORY: Family History  Problem Relation Age of Onset   Diabetes Mother    Hyperlipidemia Father    Hypertension Father    CAD Father    Heart attack Father    Liver cancer Maternal Grandmother    Leukemia Maternal Grandfather    Colon cancer Neg Hx    Esophageal cancer Neg Hx    Stomach cancer Neg Hx    Rectal cancer Neg Hx     ALLERGIES:  is allergic to anastrozole and latex.  MEDICATIONS:  Current Outpatient Medications  Medication Sig Dispense Refill   acetaminophen  (TYLENOL ) 500 MG tablet Take 1,500 mg by mouth 3 (three) times daily as needed for headache.     alendronate (FOSAMAX) 70 MG tablet Take 70 mg by mouth once a week.     atorvastatin  (LIPITOR) 20 MG tablet Take 20 mg by mouth daily.     citalopram (CELEXA) 10 MG tablet Take 10 mg by mouth daily.     ferrous sulfate 325 (65 FE) MG tablet Take 325 mg by mouth daily.     flecainide  (TAMBOCOR ) 100 MG tablet Take 50 mg by mouth 2 (two) times daily.     guaiFENesin (MUCINEX) 600 MG 12 hr tablet Take 600 mg by mouth daily as needed (allergies).     letrozole  (FEMARA ) 2.5 MG tablet Take 1 tablet (2.5 mg total) by mouth daily. 90 tablet 2   losartan (COZAAR) 50 MG tablet Take 50 mg by mouth 2 (two) times daily.     methimazole  (TAPAZOLE ) 5 MG tablet Take 0.5 tablets (2.5 mg total) by mouth daily. 52 tablet 3   metoprolol  tartrate (LOPRESSOR ) 50 MG tablet TAKE 1 TABLET (50 MG TOTAL) BY MOUTH EVERY MORNING AND 1/2 TABLETS (25 MG TOTAL) EVERY EVENING. 135 tablet 1   omeprazole  (PRILOSEC) 40 MG capsule TAKE 1 CAPSULE BY MOUTH 2 (TWO) TIMES DAILY 20-30 MINUTES BEFORE MEALS. 180 capsule 0   ondansetron  (ZOFRAN -ODT) 4 MG disintegrating tablet Take 1 tablet (4 mg total) by mouth every 8 (eight) hours as needed for nausea or vomiting. 20 tablet 0   oxyCODONE -acetaminophen  (PERCOCET/ROXICET) 5-325 MG tablet Take 1 tablet by mouth every 6 (six) hours as needed for severe pain (pain score 7-10). 15 tablet 0   sodium  chloride (OCEAN) 0.65 % nasal spray Place 1 spray into the nose 2 (two) times daily as needed. For dryness     XARELTO  20 MG TABS tablet TAKE 1 TABLET BY MOUTH DAILY WITH SUPPER. 30 tablet 5   No current facility-administered medications for this visit.    REVIEW OF SYSTEMS:   Constitutional: ( - ) fevers, ( - )  chills , ( - ) night sweats Eyes: ( - )  blurriness of vision, ( - ) double vision, ( - ) watery eyes Ears, nose, mouth, throat, and face: ( - ) mucositis, ( - ) sore throat Respiratory: ( - ) cough, ( + ) dyspnea, ( - ) wheezes Cardiovascular: ( - ) palpitation, ( - ) chest discomfort, ( - ) lower extremity swelling Gastrointestinal:  ( + ) nausea, ( - ) heartburn, ( + ) change in bowel habits Skin: ( - ) abnormal skin rashes Lymphatics: ( - ) new lymphadenopathy, ( - ) easy bruising Neurological: ( - ) numbness, ( - ) tingling, ( - ) new weaknesses Behavioral/Psych: ( - ) mood change, ( - ) new changes  All other systems were reviewed with the patient and are negative.  PHYSICAL EXAMINATION: ECOG PERFORMANCE STATUS: 1 - Symptomatic but completely ambulatory  Vitals:   11/02/23 1059  BP: 125/80  Pulse: (!) 51  Resp: 13  Temp: (!) 97.4 F (36.3 C)  SpO2: 98%      Filed Weights   11/02/23 1059  Weight: 148 lb (67.1 kg)       GENERAL: well appearing female in NAD  SKIN: skin color, texture, turgor are normal, no rashes or significant lesions EYES: conjunctiva are pink and non-injected, sclera clear.  LUNGS: clear to auscultation and percussion with normal breathing effort HEART: regular rate & rhythm and no murmurs and no lower extremity edema Musculoskeletal: no cyanosis of digits and no clubbing  PSYCH: alert & oriented x 3, fluent speech NEURO: no focal motor/sensory deficits BREAT: firmness in left upper breast near lumpectomy site. No palpable masses appreciated. No axillary lymphadenopathy.   LABORATORY DATA:  I have reviewed the data as listed     Latest Ref Rng & Units 11/02/2023   10:39 AM 10/03/2023   10:16 AM 07/17/2023    4:45 PM  CBC  WBC 4.0 - 10.5 K/uL 4.6  8.2  11.1   Hemoglobin 12.0 - 15.0 g/dL 86.9  86.3  84.5   Hematocrit 36.0 - 46.0 % 39.3  40.5  46.4   Platelets 150 - 400 K/uL 162  191  218        Latest Ref Rng & Units 11/02/2023   10:39 AM 07/17/2023    7:33 PM 05/04/2023   10:04 AM  CMP  Glucose 70 - 99 mg/dL 869  890  898   BUN 8 - 23 mg/dL 6  7  7    Creatinine 0.44 - 1.00 mg/dL 9.47  9.39  9.44   Sodium 135 - 145 mmol/L 140  140  140   Potassium 3.5 - 5.1 mmol/L 4.5  3.9  4.4   Chloride 98 - 111 mmol/L 105  105  105   CO2 22 - 32 mmol/L 30  27  31    Calcium  8.9 - 10.3 mg/dL 9.3  8.6  9.5   Total Protein 6.5 - 8.1 g/dL 6.6  6.6  6.3   Total Bilirubin 0.0 - 1.2 mg/dL 0.5  0.9  0.4   Alkaline Phos 38 - 126 U/L 70  52  62   AST 15 - 41 U/L 15  16  13    ALT 0 - 44 U/L 11  13  10      RADIOGRAPHIC STUDIES: I have personally reviewed the radiological images as listed and agreed with the findings in the report. No results found.   ASSESSMENT & PLAN ELVINA BOSCH is a 67 y.o. returns for a follow up for iron deficiency anemia  and history of left breast cancer.   #Iron deficiency anemia 2/2 chronic blood loss: --Currently on ferrous sulfate 325 mg once a day with a source of vitamin C --Patient follows vegetarian diet so provided list of iron rich foods to incorporate into diet.  --Under the care of Dr. Stacia (GI) and underwent EGD/colonoscopy from 06/05/2021 showed no evidence of active bleeding. Plan to repeat colonoscopy with ablation of cecal AVM if patient exhibits evidence of rebleeding --Underwent capsular EGD on 11/09/2021 that was incomplete due to capsule retention in the stomach. Small bowel enteroscopy was done on 12/07/2021 that showed no evidence of significant pathology in the entire duodenum/proximal jejunum. --Labs today show WBC 4.6, Hgb 13.0, MCV 93.1, Plt 162  --Patient not currently  taking p.o. iron therapy, will determine if she needs to restart these pills pending the results of her labs. --No need for IV iron at this time.  #H/o left breast cancer --Diagnosed in June 2022 and treated at [facility] --Underwent lumpectomy on 07/29/2020. 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 --Not enough tissue for Oncotype testing.  --Received adjust radiation to the left breast 10/15/2020-11/11/2020.  --Patient requested to transfer care to [facility] due to convenience of travel.  --Status post adjuvant endocrine therapy with anastrozole 1 mg daily, 11/24/2020, discontinued October 2022 secondary to myalgias and arthralgias.  PLAN: --Started adjuvant endocrine therapy with letrozole  2.5 mg daily on 02/03/2021. Plan for total of 7 years of therapy (Nov 2029).  Refilled the prescription today and asked the patient to resume this --Last mammogram was on 05/16/2023 that showed no evidence of malignancies. Next mammogram due in Feb/March 2026  --Last bone density study was in July 2024 and findings consistent with osteopenia. Current on regimen for bone health including vitamin D/calcium  and foasamax.  Follow up: --6 months: labs and follow up visit   No orders of the defined types were placed in this encounter.   All questions were answered. The patient knows to call the clinic with any problems, questions or concerns.  I have spent a total of 30 minutes minutes of face-to-face and non-face-to-face time, preparing to see the patient, performing a medically appropriate examination, counseling and educating the patient, ordering tests, communicating with other health care professionals,  and care coordination.   Kellie IVAR Kidney, MD Department of Hematology/Oncology [facility] at [facility] Phone: [phone] Pager: [phone] Email: [email or web address]

## 2023-11-02 ENCOUNTER — Inpatient Hospital Stay: Payer: Medicare HMO | Attending: Hematology and Oncology | Admitting: Hematology and Oncology

## 2023-11-02 ENCOUNTER — Inpatient Hospital Stay: Payer: Medicare HMO

## 2023-11-02 VITALS — BP 125/80 | HR 51 | Temp 97.4°F | Resp 13 | Wt 148.0 lb

## 2023-11-02 DIAGNOSIS — Z806 Family history of leukemia: Secondary | ICD-10-CM | POA: Insufficient documentation

## 2023-11-02 DIAGNOSIS — D500 Iron deficiency anemia secondary to blood loss (chronic): Secondary | ICD-10-CM

## 2023-11-02 DIAGNOSIS — F17210 Nicotine dependence, cigarettes, uncomplicated: Secondary | ICD-10-CM | POA: Diagnosis not present

## 2023-11-02 DIAGNOSIS — Z1732 Human epidermal growth factor receptor 2 negative status: Secondary | ICD-10-CM | POA: Insufficient documentation

## 2023-11-02 DIAGNOSIS — Z1721 Progesterone receptor positive status: Secondary | ICD-10-CM | POA: Diagnosis not present

## 2023-11-02 DIAGNOSIS — Z79899 Other long term (current) drug therapy: Secondary | ICD-10-CM | POA: Insufficient documentation

## 2023-11-02 DIAGNOSIS — Z808 Family history of malignant neoplasm of other organs or systems: Secondary | ICD-10-CM | POA: Insufficient documentation

## 2023-11-02 DIAGNOSIS — Z170 Estrogen receptor positive status [ER+]: Secondary | ICD-10-CM | POA: Diagnosis not present

## 2023-11-02 DIAGNOSIS — M8580 Other specified disorders of bone density and structure, unspecified site: Secondary | ICD-10-CM | POA: Diagnosis not present

## 2023-11-02 DIAGNOSIS — C50912 Malignant neoplasm of unspecified site of left female breast: Secondary | ICD-10-CM | POA: Insufficient documentation

## 2023-11-02 DIAGNOSIS — Z923 Personal history of irradiation: Secondary | ICD-10-CM | POA: Diagnosis not present

## 2023-11-02 LAB — CBC WITH DIFFERENTIAL (CANCER CENTER ONLY)
Abs Immature Granulocytes: 0.04 K/uL (ref 0.00–0.07)
Basophils Absolute: 0 K/uL (ref 0.0–0.1)
Basophils Relative: 1 %
Eosinophils Absolute: 0.2 K/uL (ref 0.0–0.5)
Eosinophils Relative: 4 %
HCT: 39.3 % (ref 36.0–46.0)
Hemoglobin: 13 g/dL (ref 12.0–15.0)
Immature Granulocytes: 1 %
Lymphocytes Relative: 28 %
Lymphs Abs: 1.3 K/uL (ref 0.7–4.0)
MCH: 30.8 pg (ref 26.0–34.0)
MCHC: 33.1 g/dL (ref 30.0–36.0)
MCV: 93.1 fL (ref 80.0–100.0)
Monocytes Absolute: 0.4 K/uL (ref 0.1–1.0)
Monocytes Relative: 8 %
Neutro Abs: 2.7 K/uL (ref 1.7–7.7)
Neutrophils Relative %: 58 %
Platelet Count: 162 K/uL (ref 150–400)
RBC: 4.22 MIL/uL (ref 3.87–5.11)
RDW: 12.7 % (ref 11.5–15.5)
WBC Count: 4.6 K/uL (ref 4.0–10.5)
nRBC: 0 % (ref 0.0–0.2)

## 2023-11-02 LAB — CMP (CANCER CENTER ONLY)
ALT: 11 U/L (ref 0–44)
AST: 15 U/L (ref 15–41)
Albumin: 4.3 g/dL (ref 3.5–5.0)
Alkaline Phosphatase: 70 U/L (ref 38–126)
Anion gap: 5 (ref 5–15)
BUN: 6 mg/dL — ABNORMAL LOW (ref 8–23)
CO2: 30 mmol/L (ref 22–32)
Calcium: 9.3 mg/dL (ref 8.9–10.3)
Chloride: 105 mmol/L (ref 98–111)
Creatinine: 0.52 mg/dL (ref 0.44–1.00)
GFR, Estimated: >60 mL/min (ref >60)
Glucose, Bld: 130 mg/dL — ABNORMAL HIGH (ref 70–99)
Potassium: 4.5 mmol/L (ref 3.5–5.1)
Sodium: 140 mmol/L (ref 135–145)
Total Bilirubin: 0.5 mg/dL (ref 0.0–1.2)
Total Protein: 6.6 g/dL (ref 6.5–8.1)

## 2023-11-02 LAB — IRON AND IRON BINDING CAPACITY (CC-WL,HP ONLY)
Iron: 103 ug/dL (ref 28–170)
Saturation Ratios: 30 % (ref 10.4–31.8)
TIBC: 342 ug/dL (ref 250–450)
UIBC: 239 ug/dL (ref 148–442)

## 2023-11-02 LAB — RETIC PANEL
Immature Retic Fract: 4.9 % (ref 2.3–15.9)
RBC.: 4.18 MIL/uL (ref 3.87–5.11)
Retic Count, Absolute: 57.7 K/uL (ref 19.0–186.0)
Retic Ct Pct: 1.4 % (ref 0.4–3.1)
Reticulocyte Hemoglobin: 35.2 pg (ref >27.9)

## 2023-11-02 LAB — FERRITIN: Ferritin: 38 ng/mL (ref 11–307)

## 2023-11-02 MED ORDER — LETROZOLE 2.5 MG PO TABS: 2.5000 mg | ORAL_TABLET | Freq: Every day | ORAL | 2 refills | Status: AC

## 2023-11-23 ENCOUNTER — Encounter: Payer: Self-pay | Admitting: *Deleted

## 2023-11-23 ENCOUNTER — Telehealth: Payer: Self-pay | Admitting: *Deleted

## 2023-11-23 NOTE — Telephone Encounter (Signed)
 Received call from pt requesting a letter indicating she can return to work without restrictions. She states she will go back to work on 11/28/2023. She is requesting the letter be fax'd to Jinnie Bach @ [phone] This was done today, 11/23/23

## 2023-12-15 ENCOUNTER — Other Ambulatory Visit: Payer: Self-pay | Admitting: Gastroenterology

## 2023-12-19 ENCOUNTER — Telehealth: Payer: Self-pay | Admitting: Hematology and Oncology

## 2023-12-19 NOTE — Telephone Encounter (Signed)
 I returned Kellie Reynolds's call per a voicemail received where she has requested to be seen in February after 2pm. She has been re-scheduled and made aware of her appointment.

## 2024-01-24 ENCOUNTER — Ambulatory Visit: Admitting: Gastroenterology

## 2024-01-31 ENCOUNTER — Ambulatory Visit: Admitting: Gastroenterology

## 2024-01-31 ENCOUNTER — Encounter: Payer: Self-pay | Admitting: Gastroenterology

## 2024-01-31 VITALS — BP 156/84 | HR 95 | Ht 68.0 in | Wt 156.0 lb

## 2024-01-31 DIAGNOSIS — K5732 Diverticulitis of large intestine without perforation or abscess without bleeding: Secondary | ICD-10-CM | POA: Diagnosis not present

## 2024-01-31 DIAGNOSIS — Z1211 Encounter for screening for malignant neoplasm of colon: Secondary | ICD-10-CM

## 2024-01-31 NOTE — Patient Instructions (Signed)
 Follow up as needed.  Thank you for trusting me with your gastrointestinal care!   Nestor Blower, PA   _______________________________________________________  If your blood pressure at your visit was 140/90 or greater, please contact your primary care physician to follow up on this.  _______________________________________________________  If you are age 67 or older, your body mass index should be between 23-30. Your Body mass index is 23.72 kg/m. If this is out of the aforementioned range listed, please consider follow up with your Primary Care Provider.  If you are age 65 or younger, your body mass index should be between 19-25. Your Body mass index is 23.72 kg/m. If this is out of the aformentioned range listed, please consider follow up with your Primary Care Provider.   ________________________________________________________  The [facility] providers would like to encourage you to use MYCHART to communicate with providers for non-urgent requests or questions.  Due to long hold times on the telephone, sending your provider a message by [facility] may be a faster and more efficient way to get a response.  Please allow 48 business hours for a response.  Please remember that this is for non-urgent requests.  _______________________________________________________  [facility] is using a team-based approach to care.  Your team is made up of your doctor and two to three APPS. Our APPS (Nurse Practitioners and Physician Assistants) work with your physician to ensure care continuity for you. They are fully qualified to address your health concerns and develop a treatment plan. They communicate directly with your gastroenterologist to care for you. Seeing the Advanced Practice Practitioners on your physician's team can help you by facilitating care more promptly, often allowing for earlier appointments, access to diagnostic testing, procedures, and other specialty referrals. '

## 2024-01-31 NOTE — Progress Notes (Signed)
 Chief Complaint: follow up of diverticulitis Primary GI MD: Dr. Stacia  HPI: Discussed the use of AI scribe software for clinical note transcription with the patient, who gave verbal consent to proceed.  Kellie Reynolds is a pleasant 68 year old female with a history of atrial fibrillation, hypertension and chronic tobacco use presenting for hospital follow up.  PREVIOUS HISTORY 04/2023: significant GERD symptoms despite PPI, and had also been having recurrent episodes of painless hematochezia.  She underwent an EGD and a colonoscopy which showed a 3 to 4 cm hiatal hernia and suspected short segment Barrett's (Barrett's showed reflux changes only, no intestinal metaplasia) and gastric erythema.  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   09/2021:experiencing abdominal pain and passage of dark stools. She was found to have a drop in her hemoglobin down to 7. She went to the emergency department in August, where a CT scan showed thickening of the duodenum. A push enteroscopy was unremarkable. A capsule endoscopy was attempted but was unsuccessful due to retention in the stomach during the entire recording time.   TODAY---------------------------------------  She experienced her first episode of diverticulitis in May, which required a visit to the emergency room. She was treated with antibiotics and painkillers, which she filled but kept at home for future use. Her symptoms improved significantly following the antibiotic treatment.  Currently, she has no symptoms such as weight loss, rectal bleeding, nausea, or vomiting. She has not experienced any blood in her stool for almost a year. She has a history of diarrhea but is now experiencing regular bowel movements, describing them as 'pooping logs' every morning.  Her last colonoscopy was in 2023, and she is up to date with this screening. She is not currently taking pain medication  regularly.   PREVIOUS GI WORKUP   Colonoscopy 2005 Tubular adenoma, sessile serrated polyp, hyperplastic polyp (pathology report available only, note procedure report)   Colonoscopy 2010 Rectal hyperplastic polyp (pathology report available only, note procedure report)   Colonoscopy September 2017 10 mm sessile serrated polyp in the ascending colon     EGD June 05, 2021 3 cm hiatal hernia/Hill grade 4 GEJ Suspected short segment Barrett's (biopsies neg for intestinal metaplasia) Gastric erythema, otherwise normal (biopsies with reactive gastropathy, H. Pylor neg)     Colonoscopy June 05, 2021 Angiectasia in cecum 3 mm polyp in sigmoid (hyperplastic) Indistinct focal granular mucosa biopsied to exclude dysplasia (hyperplastic/inflammatory changes on biopsy, no dysplasia)   Video capsule endoscopy November 09, 2021 Incomplete, capsule remained in stomach throughout entire recording time     CT abd/pelvis  November 05, 2021 IMPRESSION: 1. Irregular clustered irregular nodular opacities in the anterior left upper lobe, new from prior lung cancer screening CT. Favor infectious or inflammatory etiology. Given this is not well seen by radiograph, recommend chest CT in 3 months after course of treatment. 2. Equivocal mild focal wall thickening of the fourth portion of the duodenum, without adjacent fat stranding. This may be secondary to peptic ulcer disease, however consider correlation with endoscopy. 3. Colonic diverticulosis without diverticulitis. 4. Normal CT appearance of the urinary bladder. Physiologic bladder distension, no findings of urinary retention.     Push enteroscopy December 07, 2021 4 cm hiatal hernia Normal duodenum and jejunum, no evidence of enteritis  Past Medical History:  Diagnosis Date   Arthritis    right knee   Breast CA (HCC) 2022   left   Family history of adverse reaction  to anesthesia 35 yrs ago   daughter had malignant hyperthermia  reaction with acentine, daughter has had surgeries since then and did welll   Fibrocystic breast disease    GERD (gastroesophageal reflux disease)    HTN (hypertension)    Hypercholesteremia    Hyperthyroidism    IDA (iron deficiency anemia)    MVP (mitral valve prolapse)    Paroxysmal atrial fibrillation (HCC)    a. s/p PVI   Skin cancer    Sleep apnea    no longer use CPAP    Past Surgical History:  Procedure Laterality Date   ATRIAL FIBRILLATION ABLATION N/A 04/18/2012   PVI by Allred   BREAST LUMPECTOMY  2022   COLONOSCOPY WITH PROPOFOL  N/A 11/24/2015   Procedure: COLONOSCOPY WITH PROPOFOL ;  Surgeon: Gladis MARLA Louder, MD;  Location: [facility];  Service: Endoscopy;  Laterality: N/A;   EYE SURGERY Bilateral    lasix eye surgery both eyes   MENISECTOMY     Right knee   NASAL POLYP SURGERY  1990   NASAL SINUS SURGERY  1990   TEE WITHOUT CARDIOVERSION  04/17/2012   Procedure: TRANSESOPHAGEAL ECHOCARDIOGRAM (TEE);  Surgeon: Candyce CANDIE Reek, MD;  Location: [facility] ENDOSCOPY;  Service: Cardiovascular;  Laterality: N/A;  pre ablation   TUBAL LIGATION      Current Outpatient Medications  Medication Sig Dispense Refill   acetaminophen  (TYLENOL ) 500 MG tablet Take 1,500 mg by mouth 3 (three) times daily as needed for headache.     alendronate (FOSAMAX) 70 MG tablet Take 70 mg by mouth once a week.     atorvastatin  (LIPITOR) 20 MG tablet Take 20 mg by mouth daily.     citalopram (CELEXA) 10 MG tablet Take 10 mg by mouth daily.     ferrous sulfate 325 (65 FE) MG tablet Take 325 mg by mouth daily.     guaiFENesin (MUCINEX) 600 MG 12 hr tablet Take 600 mg by mouth daily as needed (allergies).     letrozole  (FEMARA ) 2.5 MG tablet Take 1 tablet (2.5 mg total) by mouth daily. 90 tablet 2   losartan (COZAAR) 50 MG tablet Take 50 mg by mouth 2 (two) times daily.     methimazole  (TAPAZOLE ) 5 MG tablet Take 0.5 tablets (2.5 mg total) by mouth daily. 52 tablet 3   metoprolol  tartrate  (LOPRESSOR ) 50 MG tablet TAKE 1 TABLET (50 MG TOTAL) BY MOUTH EVERY MORNING AND 1/2 TABLETS (25 MG TOTAL) EVERY EVENING. 135 tablet 1   omeprazole  (PRILOSEC) 40 MG capsule TAKE 1 CAPSULE BY MOUTH 2 (TWO) TIMES DAILY 20-30 MINUTES BEFORE MEALS. 180 capsule 0   ondansetron  (ZOFRAN -ODT) 4 MG disintegrating tablet Take 1 tablet (4 mg total) by mouth every 8 (eight) hours as needed for nausea or vomiting. 20 tablet 0   oxyCODONE -acetaminophen  (PERCOCET/ROXICET) 5-325 MG tablet Take 1 tablet by mouth every 6 (six) hours as needed for severe pain (pain score 7-10). 15 tablet 0   sodium chloride  (OCEAN) 0.65 % nasal spray Place 1 spray into the nose 2 (two) times daily as needed. For dryness     No current facility-administered medications for this visit.    Allergies as of 01/31/2024 - Review Complete 01/31/2024  Allergen Reaction Noted   Anastrozole Other (See Comments) 01/01/2021   Latex Itching, Rash, and Other (See Comments) 12/21/2019    Family History  Problem Relation Age of Onset   Diabetes Mother    Hyperlipidemia Father    Hypertension Father    CAD  Father    Heart attack Father    Liver cancer Maternal Grandmother    Leukemia Maternal Grandfather    Colon cancer Neg Hx    Esophageal cancer Neg Hx    Stomach cancer Neg Hx    Rectal cancer Neg Hx     Social History   Socioeconomic History   Marital status: Divorced    Spouse name: Not on file   Number of children: 1   Years of education: Not on file   Highest education level: Not on file  Occupational History   Not on file  Tobacco Use   Smoking status: Every Day    Current packs/day: 2.00    Average packs/day: 2.0 packs/day for 50.0 years (100.0 ttl pk-yrs)    Types: Cigarettes   Smokeless tobacco: Never  Vaping Use   Vaping status: Never Used  Substance and Sexual Activity   Alcohol use: Yes    Comment: 4 beers oper day, regular sized beers   Drug use: No   Sexual activity: Not on file  Other Topics Concern    Not on file  Social History Narrative   Not on file   Social Drivers of Health   Financial Resource Strain: Not on file  Food Insecurity: Not on file  Transportation Needs: Not on file  Physical Activity: Not on file  Stress: Not on file  Social Connections: Not on file  Intimate Partner Violence: Not on file    Review of Systems:    Constitutional: No weight loss, fever, chills, weakness or fatigue HEENT: Eyes: No change in vision               Ears, Nose, Throat:  No change in hearing or congestion Skin: No rash or itching Cardiovascular: No chest pain, chest pressure or palpitations   Respiratory: No SOB or cough Gastrointestinal: See HPI and otherwise negative Genitourinary: No dysuria or change in urinary frequency Neurological: No headache, dizziness or syncope Musculoskeletal: No new muscle or joint pain Hematologic: No bleeding or bruising Psychiatric: No history of depression or anxiety    Physical Exam:  Vital signs: BP (!) 156/84 Comment: right arm - medium cuff  Pulse 95   Ht 5' 8 (1.727 m)   Wt 156 lb (70.8 kg)   SpO2 98%   BMI 23.72 kg/m   Constitutional: NAD, alert and cooperative Head:  Normocephalic and atraumatic. Eyes:   PEERL, EOMI. No icterus. Conjunctiva pink. Respiratory: Respirations even and unlabored. Lungs clear to auscultation bilaterally.   No wheezes, crackles, or rhonchi.  Cardiovascular:  Regular rate and rhythm. No peripheral edema, cyanosis or pallor.  Gastrointestinal:  Soft, nondistended, nontender. No rebound or guarding. Normal bowel sounds. No appreciable masses or hepatomegaly. Rectal:  Declines Msk:  Symmetrical without gross deformities. Without edema, no deformity or joint abnormality.  Neurologic:  Alert and  oriented x4;  grossly normal neurologically.  Skin:   Dry and intact without significant lesions or rashes. Psychiatric: Oriented to person, place and time. Demonstrates good judgement and reason without abnormal  affect or behaviors.   RELEVANT LABS AND IMAGING: CBC    Component Value Date/Time   WBC 4.6 11/02/2023 1039   WBC 8.2 10/03/2023 1016   RBC 4.18 11/02/2023 1039   RBC 4.22 11/02/2023 1039   HGB 13.0 11/02/2023 1039   HCT 39.3 11/02/2023 1039   PLT 162 11/02/2023 1039   MCV 93.1 11/02/2023 1039   MCH 30.8 11/02/2023 1039   MCHC 33.1 11/02/2023 1039  RDW 12.7 11/02/2023 1039   LYMPHSABS 1.3 11/02/2023 1039   MONOABS 0.4 11/02/2023 1039   EOSABS 0.2 11/02/2023 1039   BASOSABS 0.0 11/02/2023 1039    CMP     Component Value Date/Time   NA 140 11/02/2023 1039   K 4.5 11/02/2023 1039   CL 105 11/02/2023 1039   CO2 30 11/02/2023 1039   GLUCOSE 130 (H) 11/02/2023 1039   BUN 6 (L) 11/02/2023 1039   CREATININE 0.52 11/02/2023 1039   CALCIUM  9.3 11/02/2023 1039   PROT 6.6 11/02/2023 1039   ALBUMIN 4.3 11/02/2023 1039   AST 15 11/02/2023 1039   ALT 11 11/02/2023 1039   ALKPHOS 70 11/02/2023 1039   BILITOT 0.5 11/02/2023 1039   GFRNONAA >60 11/02/2023 1039   GFRAA >60 10/07/2014 1600     Assessment/Plan:   Diverticulitis Sigmoid diverticulitis May 2025 confirmed with CT.  Colonoscopy 05/2021 with hyperplastic polyp, hyperplastic changes in the cecum, and diverticulosis.  Diverticulitis resolved with antibiotics.  Doing well and having regular bowel movements without difficulty - Educated patient on diverticulitis - Recommend high-fiber diet - Provided patient education handout - If recurrent symptoms please call us , otherwise can follow-up as needed -- recent UTD colonoscopy is reassuring  Colon cancer screening Colonoscopy 05/2021 Angiectasia in cecum 3 mm polyp in sigmoid (hyperplastic) Indistinct focal granular mucosa biopsied to exclude dysplasia (hyperplastic/inflammatory changes on biopsy, no dysplasia) -Recall 05/2031  Nestor Blower, PA-C [facility] 01/31/2024, 3:41 PM  Cc: [facility]*

## 2024-02-03 ENCOUNTER — Encounter: Payer: Self-pay | Admitting: Gastroenterology

## 2024-02-03 ENCOUNTER — Telehealth: Payer: Self-pay | Admitting: Gastroenterology

## 2024-02-03 MED ORDER — OMEPRAZOLE 40 MG PO CPDR: DELAYED_RELEASE_CAPSULE | ORAL | 3 refills | Status: AC

## 2024-02-03 NOTE — Telephone Encounter (Signed)
 Inbound call from patient stating she needs a refill on medication omeprazole  was last seen in office on 01/31/24 Please advise  Thank you

## 2024-02-03 NOTE — Telephone Encounter (Signed)
 Prescription sent to patient's pharmacy.

## 2024-02-16 NOTE — Progress Notes (Signed)
 Agree with the assessment and plan as outlined by Boone Master, PA-C.

## 2024-04-03 ENCOUNTER — Ambulatory Visit: Admitting: Internal Medicine

## 2024-04-03 ENCOUNTER — Encounter: Payer: Self-pay | Admitting: Internal Medicine

## 2024-04-03 ENCOUNTER — Other Ambulatory Visit

## 2024-04-03 VITALS — BP 130/84 | HR 67 | Ht 68.0 in | Wt 158.0 lb

## 2024-04-03 DIAGNOSIS — E042 Nontoxic multinodular goiter: Secondary | ICD-10-CM

## 2024-04-03 DIAGNOSIS — E0590 Thyrotoxicosis, unspecified without thyrotoxic crisis or storm: Secondary | ICD-10-CM | POA: Diagnosis not present

## 2024-04-03 NOTE — Progress Notes (Unsigned)
 "   Name: Kellie Reynolds  MRN/ DOB: [id number], [birth year]    Age/ Sex: 69 y.o., female    PCP: [facility]   Reason for Endocrinology Evaluation: MNG     Date of Initial Endocrinology Evaluation: 06/18/2021    HPI: Kellie Reynolds is a 68 y.o. female with a past medical history of MNG, A.Fib, osteoporosis and DM, Hx of breast Ca ( Left lumpectomy . The patient presented for initial endocrinology clinic visit on 06/18/2021 for consultative assistance with her Subclinical Hyperthyroidism and MNG.   Patient states that she has had diarrhea since 2020 which was started following radiation treatment for left breast cancer.  During evaluation for causes of diarrhea the patient has been noted to have multinodular goiter   No FH of thyroid  disease    In review of her chart she had a Hx of thyroid  uptake and scan in 2008 with a 24-hr uptake at 12% . Thyroid  uptake and scan 03/2021 showed subtle areas of decrease tracer uptake at the lateral aspect of mid and inferior right lobe   On her initial visit to our clinic she had an TSH 0.31 uIU/mL with normal T4 and T3.  We started methimazole  2.5 mg daily (06/2021)  No prior use of Amiodarone   SUBJECTIVE:    Today (04/03/24):  Kellie Reynolds is here for follow-up of multinodular goiter and hyperthyroidism   Weight continues to fluctuate No loca neck swelling  No dysphagia  Continues with occasional palpitations  A. Fib stable ( S/P cardioversion ) Has recent diverticulitis, had a follow up with GI  Has noted loose stools that she attribites to changin   No biotin intake   Methimazole  5 mg, half a tablet daily    HISTORY:  Past Medical History:  Past Medical History:  Diagnosis Date   Arthritis    right knee   Breast CA (HCC) 2022   left   Family history of adverse reaction to anesthesia 35 yrs ago   daughter had malignant hyperthermia reaction with acentine, daughter has had surgeries since then and did  welll   Fibrocystic breast disease    GERD (gastroesophageal reflux disease)    HTN (hypertension)    Hypercholesteremia    Hyperthyroidism    IDA (iron deficiency anemia)    MVP (mitral valve prolapse)    Paroxysmal atrial fibrillation (HCC)    a. s/p PVI   Skin cancer    Sleep apnea    no longer use CPAP   Past Surgical History:  Past Surgical History:  Procedure Laterality Date   ATRIAL FIBRILLATION ABLATION N/A 04/18/2012   PVI by Allred   BREAST LUMPECTOMY  2022   COLONOSCOPY WITH PROPOFOL  N/A 11/24/2015   Procedure: COLONOSCOPY WITH PROPOFOL ;  Surgeon: Gladis MARLA Louder, MD;  Location: [facility] ENDOSCOPY;  Service: Endoscopy;  Laterality: N/A;   EYE SURGERY Bilateral    lasix eye surgery both eyes   MENISECTOMY     Right knee   NASAL POLYP SURGERY  1990   NASAL SINUS SURGERY  1990   TEE WITHOUT CARDIOVERSION  04/17/2012   Procedure: TRANSESOPHAGEAL ECHOCARDIOGRAM (TEE);  Surgeon: Candyce CANDIE Reek, MD;  Location: [facility] ENDOSCOPY;  Service: Cardiovascular;  Laterality: N/A;  pre ablation   TUBAL LIGATION      Social History:  reports that she has been smoking cigarettes. She has a 100 pack-year smoking history. She has never used smokeless tobacco. She reports current alcohol use. She reports  that she does not use drugs. Family History: family history includes CAD in her father; Diabetes in her mother; Heart attack in her father; Hyperlipidemia in her father; Hypertension in her father; Leukemia in her maternal grandfather; Liver cancer in her maternal grandmother.   HOME MEDICATIONS: Allergies as of 04/03/2024       Reactions   Anastrozole Other (See Comments)   unknown   Latex Itching, Rash, Other (See Comments)   Elastic in underwear        Medication List        Accurate as of April 03, 2024  2:43 PM. If you have any questions, ask your nurse or doctor.          acetaminophen  500 MG tablet Commonly known as: TYLENOL  Take 1,500 mg by mouth 3 (three) times  daily as needed for headache.   alendronate 70 MG tablet Commonly known as: FOSAMAX Take 70 mg by mouth once a week.   atorvastatin  20 MG tablet Commonly known as: LIPITOR Take 20 mg by mouth daily.   citalopram 10 MG tablet Commonly known as: CELEXA Take 10 mg by mouth daily.   ferrous sulfate 325 (65 FE) MG tablet Take 325 mg by mouth daily.   guaiFENesin 600 MG 12 hr tablet Commonly known as: MUCINEX Take 600 mg by mouth daily as needed (allergies).   letrozole  2.5 MG tablet Commonly known as: FEMARA  Take 1 tablet (2.5 mg total) by mouth daily.   losartan 50 MG tablet Commonly known as: COZAAR Take 50 mg by mouth 2 (two) times daily.   methimazole  5 MG tablet Commonly known as: TAPAZOLE  Take 0.5 tablets (2.5 mg total) by mouth daily.   metoprolol  tartrate 50 MG tablet Commonly known as: LOPRESSOR  TAKE 1 TABLET (50 MG TOTAL) BY MOUTH EVERY MORNING AND 1/2 TABLETS (25 MG TOTAL) EVERY EVENING.   omeprazole  40 MG capsule Commonly known as: PRILOSEC TAKE 1 CAPSULE BY MOUTH 2 (TWO) TIMES DAILY 20-30 MINUTES BEFORE MEALS.   ondansetron  4 MG disintegrating tablet Commonly known as: ZOFRAN -ODT Take 1 tablet (4 mg total) by mouth every 8 (eight) hours as needed for nausea or vomiting.   oxyCODONE -acetaminophen  5-325 MG tablet Commonly known as: PERCOCET/ROXICET Take 1 tablet by mouth every 6 (six) hours as needed for severe pain (pain score 7-10).   sodium chloride  0.65 % nasal spray Commonly known as: OCEAN Place 1 spray into the nose 2 (two) times daily as needed. For dryness          REVIEW OF SYSTEMS: A comprehensive ROS was conducted with the patient and is negative except as per HPI   OBJECTIVE:  VS: BP 130/84   Pulse 67   Ht 5' 8 (1.727 m)   Wt 158 lb (71.7 kg)   SpO2 98%   BMI 24.02 kg/m    Wt Readings from Last 3 Encounters:  04/03/24 158 lb (71.7 kg)  01/31/24 156 lb (70.8 kg)  11/02/23 148 lb (67.1 kg)     EXAM: General: Pt appears  well and is in NAD  Neck: General: Supple without adenopathy. Thyroid : Thyroid  size normal.  No goiter or nodules appreciated.   Lungs: Clear with good BS bilat with no rales, rhonchi, or wheezes  Heart: Auscultation: RRR.  Extremities:  BL LE: No pretibial edema   Mental Status: Judgment, insight: Intact Orientation: Oriented to time, place, and person Mood and affect: No depression, anxiety, or agitation     DATA REVIEWED:    Latest Reference  Range & Units 04/03/24 14:58  TSH 0.40 - 4.50 mIU/L 1.79  Triiodothyronine,Free,Serum 2.3 - 4.2 pg/mL 3.2  T4,Free(Direct) 0.8 - 1.8 ng/dL 1.0     Latest Reference Range & Units 10/03/23 10:16  WBC 3.8 - 10.8 Thousand/uL 8.2  RBC 3.80 - 5.10 Million/uL 4.31  Hemoglobin 11.7 - 15.5 g/dL 86.3  HCT 64.9 - 54.9 % 40.5  MCV 80.0 - 100.0 fL 94.0  MCH 27.0 - 33.0 pg 31.6  MCHC 32.0 - 36.0 g/dL 66.3  RDW 88.9 - 84.9 % 12.7  Platelets 140 - 400 Thousand/uL 191  MPV 7.5 - 12.5 fL 12.0    Latest Reference Range & Units 10/03/23 10:16  TSH 0.40 - 4.50 mIU/L 0.67  T4,Free(Direct) 0.8 - 1.8 ng/dL 0.8      Latest Reference Range & Units 03/26/22 10:00  TRAB <=2.00 IU/L 1.15     Thyroid  ultrasound 04/13/2023  Estimated total number of nodules >/= 1 cm: 3   Number of spongiform nodules >/=  2 cm not described below (TR1): 0   Number of mixed cystic and solid nodules >/= 1.5 cm not described below (TR2): 0   _________________________________________________________   Enlarged, heterogeneous thyroid  gland containing multiple cysts and nodules. Many of the lesions measure less than 1 cm or otherwise demonstrate overtly benign features and do not warrant further consideration.   Nodule # 2: Small hypoechoic solid nodule in the right mid gland demonstrates internal dystrophic calcifications and remains stable in size and appearance at 1.1 x 1.0 x 1.0 cm. Findings remain consistent with TI-RADS category 4. *Given size (>/= 1 - 1.4 cm)  and appearance, a follow-up ultrasound in 1 year should be considered based on TI-RADS criteria.   Nodule # 3: Interval increase in cystic degeneration of this lesion which now has a spongiform appearance. This appearance is sonographically low risk/benign. No further follow-up.   Nodule # 4: Stable appearance of spongiform nodule in the right lower gland at 1.3 cm. No further follow-up.   IMPRESSION: 1. Continued stability of nodule # 2 in the right mid gland this TI-RADS category 4 nodule continues to meet criteria for imaging surveillance. The present exam confirms 2 years of stability. Recommend follow-up ultrasound annually until 5 years of stability is confirmed. 2. Interval cystic degeneration of nodule # 3 which now has a sonographically benign/low risk spongiform appearance. This lesion no longer meets criteria for further surveillance. 3. No new nodules or suspicious features.    Thyroid  Uptake 03/26/2021    FINDINGS: Homogeneous tracer distribution LEFT thyroid  lobe.   Subtle areas of decreased tracer uptake at lateral aspect of mid and inferior RIGHT thyroid  lobe; these correspond to nodules identified on ultrasound.   No additional areas of increased or decreased tracer uptake seen.   4 hour I-123 uptake = 10.1% (normal 5-20%)   24 hour I-123 uptake = 25.5% (normal 10-30%)   IMPRESSION: Normal 4 hour and 24 hour radio iodine uptakes.   Subtle cold nodules at the lateral aspect of the mid and inferior RIGHT thyroid  lobe, corresponding to nodules identified by thyroid  ultrasound; please see recommendations of recent thyroid  ultrasound.     ASSESSMENT/PLAN/RECOMMENDATIONS:   MNG  -No local neck symptoms -Thyroid  uptake and scan shows subtle cold nodules at the lateral aspect of the mid and the inferior right thyroid  lobe but based on thyroid  ultrasound none of these nodules met criteria for FNA  -Repeat ultrasound 03/2022  and 03/2023 shows stability of the  nodules  2.  Hyperthyroidism :   - Patient is clinically euthyroid -We have opted to treat with methimazole  given personal history of atrial fibrillation and the importance of keeping thyroid  levels at normal level to prevent any cardiac arrhythmias - TFTs continue to be normal, no change  Medication Continue methimazole  5 mg, half a tablet daily   F/U in 6 months    Signed electronically by: Stefano Redgie Butts, MD  [facility]  [facility] [address] [place] [place], [place] [address] Phone: [phone] FAX: [phone]   CC: [facility] [address] [place] [address] Phone: [phone] Fax: [phone]   Return to Endocrinology clinic as below: Future Appointments  Date Time Provider Department Center  05/04/2024  2:30 PM [facility] None  05/04/2024  3:00 PM Federico Norleen ONEIDA MADISON, MD [facility] None        "

## 2024-04-04 ENCOUNTER — Ambulatory Visit: Admitting: Internal Medicine

## 2024-04-04 ENCOUNTER — Ambulatory Visit: Payer: Self-pay | Admitting: Internal Medicine

## 2024-04-04 LAB — T4, FREE: Free T4: 1 ng/dL (ref 0.8–1.8)

## 2024-04-04 LAB — T3, FREE: T3, Free: 3.2 pg/mL (ref 2.3–4.2)

## 2024-04-04 LAB — TSH: TSH: 1.79 m[IU]/L (ref 0.40–4.50)

## 2024-04-04 MED ORDER — METHIMAZOLE 5 MG PO TABS: 2.5000 mg | ORAL_TABLET | Freq: Every day | ORAL | 3 refills | Status: AC

## 2024-04-17 ENCOUNTER — Other Ambulatory Visit: Payer: Self-pay | Admitting: Nurse Practitioner

## 2024-04-17 DIAGNOSIS — Z853 Personal history of malignant neoplasm of breast: Secondary | ICD-10-CM

## 2024-05-04 ENCOUNTER — Inpatient Hospital Stay

## 2024-05-04 ENCOUNTER — Inpatient Hospital Stay: Admitting: Hematology and Oncology

## 2024-05-04 ENCOUNTER — Ambulatory Visit: Admitting: Hematology and Oncology

## 2024-05-04 ENCOUNTER — Other Ambulatory Visit

## 2024-05-16 ENCOUNTER — Encounter

## 2024-10-02 ENCOUNTER — Ambulatory Visit: Admitting: Internal Medicine
